# Patient Record
Sex: Male | Born: 1960 | Race: White | Hispanic: No | Marital: Single | Smoking: Never smoker
Health system: Southern US, Community
[De-identification: ages and names within clinical notes are randomized; demographics above are authoritative.]

## PROBLEM LIST (undated history)

## (undated) ENCOUNTER — Emergency Department: Discharge status: Absent without leave | Payer: Self-pay

## (undated) ENCOUNTER — Emergency Department (HOSPITAL_COMMUNITY): Admission: EM | Payer: Managed Care, Other (non HMO)

## (undated) DIAGNOSIS — R519 Headache, unspecified: Secondary | ICD-10-CM

## (undated) DIAGNOSIS — C4491 Basal cell carcinoma of skin, unspecified: Secondary | ICD-10-CM

## (undated) DIAGNOSIS — J328 Other chronic sinusitis: Secondary | ICD-10-CM

## (undated) DIAGNOSIS — IMO0002 Reserved for concepts with insufficient information to code with codable children: Secondary | ICD-10-CM

## (undated) DIAGNOSIS — K589 Irritable bowel syndrome without diarrhea: Secondary | ICD-10-CM

## (undated) DIAGNOSIS — D039 Melanoma in situ, unspecified: Secondary | ICD-10-CM

## (undated) DIAGNOSIS — N400 Enlarged prostate without lower urinary tract symptoms: Secondary | ICD-10-CM

## (undated) DIAGNOSIS — E785 Hyperlipidemia, unspecified: Secondary | ICD-10-CM

## (undated) DIAGNOSIS — K219 Gastro-esophageal reflux disease without esophagitis: Secondary | ICD-10-CM

## (undated) DIAGNOSIS — F419 Anxiety disorder, unspecified: Secondary | ICD-10-CM

## (undated) DIAGNOSIS — K5790 Diverticulosis of intestine, part unspecified, without perforation or abscess without bleeding: Secondary | ICD-10-CM

## (undated) DIAGNOSIS — E291 Testicular hypofunction: Secondary | ICD-10-CM

## (undated) DIAGNOSIS — M542 Cervicalgia: Secondary | ICD-10-CM

## (undated) DIAGNOSIS — B009 Herpesviral infection, unspecified: Secondary | ICD-10-CM

## (undated) DIAGNOSIS — K760 Fatty (change of) liver, not elsewhere classified: Secondary | ICD-10-CM

## (undated) DIAGNOSIS — Z9289 Personal history of other medical treatment: Secondary | ICD-10-CM

## (undated) DIAGNOSIS — R51 Headache: Secondary | ICD-10-CM

## (undated) DIAGNOSIS — F410 Panic disorder [episodic paroxysmal anxiety] without agoraphobia: Secondary | ICD-10-CM

## (undated) HISTORY — DX: Hyperlipidemia, unspecified: E78.5

## (undated) HISTORY — DX: Personal history of other medical treatment: Z92.89

## (undated) HISTORY — PX: HERNIA REPAIR: SHX51

## (undated) HISTORY — PX: ORCHIECTOMY: SHX2116

## (undated) HISTORY — DX: Headache: R51

## (undated) HISTORY — DX: Gastro-esophageal reflux disease without esophagitis: K21.9

## (undated) HISTORY — DX: Benign prostatic hyperplasia without lower urinary tract symptoms: N40.0

## (undated) HISTORY — PX: COLONOSCOPY: SHX174

## (undated) HISTORY — DX: Testicular hypofunction: E29.1

## (undated) HISTORY — DX: Cervicalgia: M54.2

## (undated) HISTORY — PX: GANGLION CYST EXCISION: SHX1691

## (undated) HISTORY — PX: TONSILLECTOMY: SUR1361

## (undated) HISTORY — PX: OTHER SURGICAL HISTORY: SHX169

## (undated) HISTORY — DX: Headache, unspecified: R51.9

## (undated) HISTORY — DX: Anxiety disorder, unspecified: F41.9

## (undated) HISTORY — DX: Irritable bowel syndrome, unspecified: K58.9

## (undated) HISTORY — DX: Panic disorder (episodic paroxysmal anxiety): F41.0

## (undated) HISTORY — DX: Diverticulosis of intestine, part unspecified, without perforation or abscess without bleeding: K57.90

## (undated) HISTORY — PX: SPINE SURGERY: SHX786

## (undated) HISTORY — DX: Reserved for concepts with insufficient information to code with codable children: IMO0002

## (undated) HISTORY — DX: Other chronic sinusitis: J32.8

## (undated) HISTORY — DX: Melanoma in situ, unspecified: D03.9

## (undated) HISTORY — DX: Fatty (change of) liver, not elsewhere classified: K76.0

## (undated) HISTORY — DX: Herpesviral infection, unspecified: B00.9

## (undated) HISTORY — DX: Basal cell carcinoma of skin, unspecified: C44.91

---

## 2001-11-22 ENCOUNTER — Encounter: Payer: Self-pay | Admitting: Internal Medicine

## 2001-11-22 ENCOUNTER — Ambulatory Visit (HOSPITAL_COMMUNITY): Admission: RE | Admit: 2001-11-22 | Discharge: 2001-11-22 | Payer: Self-pay | Admitting: Internal Medicine

## 2002-08-23 ENCOUNTER — Encounter: Payer: Self-pay | Admitting: Internal Medicine

## 2002-08-23 ENCOUNTER — Encounter: Admission: RE | Admit: 2002-08-23 | Discharge: 2002-08-23 | Payer: Self-pay | Admitting: Internal Medicine

## 2004-09-01 ENCOUNTER — Ambulatory Visit: Payer: Self-pay | Admitting: Internal Medicine

## 2004-09-02 ENCOUNTER — Ambulatory Visit: Payer: Self-pay | Admitting: Internal Medicine

## 2004-09-07 ENCOUNTER — Ambulatory Visit: Payer: Self-pay

## 2004-10-13 ENCOUNTER — Ambulatory Visit: Payer: Self-pay | Admitting: Internal Medicine

## 2004-11-09 ENCOUNTER — Ambulatory Visit: Payer: Self-pay | Admitting: Internal Medicine

## 2005-01-20 ENCOUNTER — Ambulatory Visit: Payer: Self-pay | Admitting: Internal Medicine

## 2005-02-10 ENCOUNTER — Ambulatory Visit: Payer: Self-pay | Admitting: Internal Medicine

## 2005-02-17 ENCOUNTER — Ambulatory Visit: Payer: Self-pay | Admitting: Internal Medicine

## 2005-02-25 ENCOUNTER — Ambulatory Visit: Payer: Self-pay | Admitting: Internal Medicine

## 2005-03-22 ENCOUNTER — Ambulatory Visit: Payer: Self-pay | Admitting: Internal Medicine

## 2005-04-27 ENCOUNTER — Ambulatory Visit: Payer: Self-pay | Admitting: Internal Medicine

## 2005-07-27 ENCOUNTER — Ambulatory Visit: Payer: Self-pay | Admitting: Internal Medicine

## 2005-11-02 ENCOUNTER — Ambulatory Visit: Payer: Self-pay | Admitting: Internal Medicine

## 2005-11-09 ENCOUNTER — Ambulatory Visit: Payer: Self-pay | Admitting: Internal Medicine

## 2005-11-22 ENCOUNTER — Ambulatory Visit: Payer: Self-pay | Admitting: Internal Medicine

## 2005-12-15 ENCOUNTER — Ambulatory Visit: Payer: Self-pay | Admitting: Internal Medicine

## 2005-12-28 ENCOUNTER — Ambulatory Visit: Payer: Self-pay | Admitting: Internal Medicine

## 2006-02-27 ENCOUNTER — Ambulatory Visit: Payer: Self-pay | Admitting: Internal Medicine

## 2006-04-25 HISTORY — PX: BACK SURGERY: SHX140

## 2006-05-01 ENCOUNTER — Ambulatory Visit: Payer: Self-pay | Admitting: Internal Medicine

## 2006-11-10 DIAGNOSIS — K589 Irritable bowel syndrome without diarrhea: Secondary | ICD-10-CM | POA: Insufficient documentation

## 2006-11-15 ENCOUNTER — Ambulatory Visit: Payer: Self-pay | Admitting: Internal Medicine

## 2006-11-15 LAB — CONVERTED CEMR LAB
ALT: 45 units/L (ref 0–53)
AST: 28 units/L (ref 0–37)
Albumin: 4 g/dL (ref 3.5–5.2)
Alkaline Phosphatase: 41 units/L (ref 39–117)
BUN: 15 mg/dL (ref 6–23)
Basophils Absolute: 0 10*3/uL (ref 0.0–0.1)
Basophils Relative: 0.8 % (ref 0.0–1.0)
Bilirubin Urine: NEGATIVE
Bilirubin, Direct: 0.1 mg/dL (ref 0.0–0.3)
Blood in Urine, dipstick: NEGATIVE
CO2: 27 meq/L (ref 19–32)
Calcium: 9.4 mg/dL (ref 8.4–10.5)
Chloride: 105 meq/L (ref 96–112)
Cholesterol: 260 mg/dL (ref 0–200)
Creatinine, Ser: 1.1 mg/dL (ref 0.4–1.5)
Direct LDL: 135.6 mg/dL
Eosinophils Absolute: 0.1 10*3/uL (ref 0.0–0.6)
Eosinophils Relative: 2 % (ref 0.0–5.0)
GFR calc Af Amer: 93 mL/min
GFR calc non Af Amer: 77 mL/min
Glucose, Bld: 102 mg/dL — ABNORMAL HIGH (ref 70–99)
Glucose, Urine, Semiquant: NEGATIVE
HCT: 41.4 % (ref 39.0–52.0)
HDL: 33.4 mg/dL — ABNORMAL LOW (ref >39.0)
Hemoglobin: 14.3 g/dL (ref 13.0–17.0)
Ketones, urine, test strip: NEGATIVE
Lymphocytes Relative: 42.6 % (ref 12.0–46.0)
MCHC: 34.5 g/dL (ref 30.0–36.0)
MCV: 90.5 fL (ref 78.0–100.0)
Monocytes Absolute: 0.5 10*3/uL (ref 0.2–0.7)
Monocytes Relative: 8.1 % (ref 3.0–11.0)
Neutro Abs: 2.7 10*3/uL (ref 1.4–7.7)
Neutrophils Relative %: 46.5 % (ref 43.0–77.0)
Nitrite: NEGATIVE
PSA: 0.62 ng/mL (ref 0.10–4.00)
Platelets: 326 10*3/uL (ref 150–400)
Potassium: 4 meq/L (ref 3.5–5.1)
Protein, U semiquant: NEGATIVE
RBC: 4.57 M/uL (ref 4.22–5.81)
RDW: 12.6 % (ref 11.5–14.6)
Sodium: 140 meq/L (ref 135–145)
Specific Gravity, Urine: 1.025
TSH: 3.98 microintl units/mL (ref 0.35–5.50)
Testosterone: 203.23 ng/dL — ABNORMAL LOW (ref 350.00–890)
Total Bilirubin: 0.8 mg/dL (ref 0.3–1.2)
Total CHOL/HDL Ratio: 7.8
Total Protein: 7.1 g/dL (ref 6.0–8.3)
Triglycerides: 324 mg/dL (ref 0–149)
Urobilinogen, UA: 0.2
VLDL: 65 mg/dL — ABNORMAL HIGH (ref 0–40)
WBC Urine, dipstick: NEGATIVE
WBC: 5.7 10*3/uL (ref 4.5–10.5)
pH: 5

## 2006-11-20 DIAGNOSIS — B009 Herpesviral infection, unspecified: Secondary | ICD-10-CM | POA: Insufficient documentation

## 2006-11-22 ENCOUNTER — Ambulatory Visit: Payer: Self-pay | Admitting: Internal Medicine

## 2006-11-22 DIAGNOSIS — E291 Testicular hypofunction: Secondary | ICD-10-CM | POA: Insufficient documentation

## 2006-11-22 DIAGNOSIS — E785 Hyperlipidemia, unspecified: Secondary | ICD-10-CM | POA: Insufficient documentation

## 2006-11-30 ENCOUNTER — Ambulatory Visit: Payer: Self-pay | Admitting: Internal Medicine

## 2007-01-12 ENCOUNTER — Ambulatory Visit: Payer: Self-pay | Admitting: Internal Medicine

## 2007-02-07 ENCOUNTER — Ambulatory Visit: Payer: Self-pay | Admitting: Internal Medicine

## 2007-02-07 LAB — CONVERTED CEMR LAB
Cholesterol: 231 mg/dL (ref 0–200)
Direct LDL: 126 mg/dL
HDL: 32 mg/dL — ABNORMAL LOW (ref >39.0)
Total CHOL/HDL Ratio: 7.2
Triglycerides: 345 mg/dL (ref 0–149)
VLDL: 69 mg/dL — ABNORMAL HIGH (ref 0–40)

## 2007-02-14 ENCOUNTER — Ambulatory Visit: Payer: Self-pay | Admitting: Internal Medicine

## 2007-02-14 LAB — CONVERTED CEMR LAB
Cholesterol, target level: 200 mg/dL
HDL goal, serum: 40 mg/dL
LDL Goal: 130 mg/dL

## 2007-03-19 ENCOUNTER — Telehealth: Payer: Self-pay | Admitting: Internal Medicine

## 2007-03-31 ENCOUNTER — Ambulatory Visit: Payer: Self-pay | Admitting: Family Medicine

## 2007-05-18 ENCOUNTER — Ambulatory Visit: Payer: Self-pay | Admitting: Internal Medicine

## 2007-05-30 ENCOUNTER — Ambulatory Visit: Payer: Self-pay | Admitting: Internal Medicine

## 2007-05-30 DIAGNOSIS — J328 Other chronic sinusitis: Secondary | ICD-10-CM | POA: Insufficient documentation

## 2007-05-30 DIAGNOSIS — G4700 Insomnia, unspecified: Secondary | ICD-10-CM | POA: Insufficient documentation

## 2007-07-06 ENCOUNTER — Ambulatory Visit: Payer: Self-pay | Admitting: Internal Medicine

## 2007-07-06 LAB — CONVERTED CEMR LAB
ALT: 35 units/L (ref 0–53)
AST: 23 units/L (ref 0–37)
Albumin: 4.2 g/dL (ref 3.5–5.2)
Alkaline Phosphatase: 34 units/L — ABNORMAL LOW (ref 39–117)
Bilirubin, Direct: 0.1 mg/dL (ref 0.0–0.3)
Cholesterol: 191 mg/dL (ref 0–200)
HDL: 41.9 mg/dL (ref >39.0)
LDL Cholesterol: 118 mg/dL — ABNORMAL HIGH (ref 0–99)
Testosterone: 163.67 ng/dL — ABNORMAL LOW (ref 350.00–890)
Total Bilirubin: 0.7 mg/dL (ref 0.3–1.2)
Total CHOL/HDL Ratio: 4.6
Total Protein: 6.9 g/dL (ref 6.0–8.3)
Triglycerides: 154 mg/dL — ABNORMAL HIGH (ref 0–149)
VLDL: 31 mg/dL (ref 0–40)

## 2007-07-11 ENCOUNTER — Ambulatory Visit: Payer: Self-pay | Admitting: Internal Medicine

## 2007-07-11 DIAGNOSIS — F419 Anxiety disorder, unspecified: Secondary | ICD-10-CM | POA: Insufficient documentation

## 2007-07-25 ENCOUNTER — Telehealth: Payer: Self-pay | Admitting: Internal Medicine

## 2007-08-07 ENCOUNTER — Ambulatory Visit: Payer: Self-pay | Admitting: Internal Medicine

## 2007-08-07 DIAGNOSIS — K2900 Acute gastritis without bleeding: Secondary | ICD-10-CM | POA: Insufficient documentation

## 2007-09-01 ENCOUNTER — Ambulatory Visit: Payer: Self-pay | Admitting: Family Medicine

## 2007-09-04 ENCOUNTER — Telehealth: Payer: Self-pay | Admitting: Internal Medicine

## 2007-09-05 ENCOUNTER — Ambulatory Visit: Payer: Self-pay | Admitting: Internal Medicine

## 2007-11-12 ENCOUNTER — Ambulatory Visit: Payer: Self-pay | Admitting: Internal Medicine

## 2007-11-12 ENCOUNTER — Telehealth: Payer: Self-pay | Admitting: Internal Medicine

## 2007-11-12 DIAGNOSIS — R079 Chest pain, unspecified: Secondary | ICD-10-CM | POA: Insufficient documentation

## 2007-11-12 DIAGNOSIS — K219 Gastro-esophageal reflux disease without esophagitis: Secondary | ICD-10-CM | POA: Insufficient documentation

## 2007-12-28 ENCOUNTER — Ambulatory Visit: Payer: Self-pay | Admitting: Family Medicine

## 2007-12-28 DIAGNOSIS — IMO0002 Reserved for concepts with insufficient information to code with codable children: Secondary | ICD-10-CM | POA: Insufficient documentation

## 2008-03-03 ENCOUNTER — Ambulatory Visit: Payer: Self-pay | Admitting: Internal Medicine

## 2008-03-03 LAB — CONVERTED CEMR LAB
AST: 32 units/L (ref 0–37)
Albumin: 4.3 g/dL (ref 3.5–5.2)
Alkaline Phosphatase: 29 units/L — ABNORMAL LOW (ref 39–117)
Bilirubin, Direct: 0.1 mg/dL (ref 0.0–0.3)
LDL Cholesterol: 115 mg/dL — ABNORMAL HIGH (ref 0–99)
Total Bilirubin: 0.9 mg/dL (ref 0.3–1.2)
Total CHOL/HDL Ratio: 4.9

## 2008-03-10 ENCOUNTER — Telehealth: Payer: Self-pay | Admitting: Internal Medicine

## 2008-03-12 ENCOUNTER — Ambulatory Visit: Payer: Self-pay | Admitting: Internal Medicine

## 2008-04-02 ENCOUNTER — Ambulatory Visit: Payer: Self-pay | Admitting: Internal Medicine

## 2008-04-16 ENCOUNTER — Telehealth: Payer: Self-pay | Admitting: *Deleted

## 2008-04-16 ENCOUNTER — Ambulatory Visit: Payer: Self-pay | Admitting: Internal Medicine

## 2008-04-30 ENCOUNTER — Ambulatory Visit: Payer: Self-pay | Admitting: Internal Medicine

## 2008-05-07 ENCOUNTER — Telehealth: Payer: Self-pay | Admitting: Internal Medicine

## 2008-05-21 ENCOUNTER — Telehealth: Payer: Self-pay | Admitting: Internal Medicine

## 2008-05-21 ENCOUNTER — Encounter: Payer: Self-pay | Admitting: Internal Medicine

## 2008-05-28 ENCOUNTER — Ambulatory Visit: Payer: Self-pay | Admitting: Internal Medicine

## 2008-06-18 ENCOUNTER — Ambulatory Visit: Payer: Self-pay | Admitting: Internal Medicine

## 2008-07-22 ENCOUNTER — Ambulatory Visit: Payer: Self-pay | Admitting: Internal Medicine

## 2008-07-22 DIAGNOSIS — M549 Dorsalgia, unspecified: Secondary | ICD-10-CM | POA: Insufficient documentation

## 2008-07-22 DIAGNOSIS — N400 Enlarged prostate without lower urinary tract symptoms: Secondary | ICD-10-CM | POA: Insufficient documentation

## 2008-07-23 ENCOUNTER — Encounter: Admission: RE | Admit: 2008-07-23 | Discharge: 2008-07-23 | Payer: Self-pay | Admitting: Internal Medicine

## 2008-08-05 ENCOUNTER — Ambulatory Visit: Payer: Self-pay | Admitting: Internal Medicine

## 2008-08-06 ENCOUNTER — Ambulatory Visit: Payer: Self-pay | Admitting: Internal Medicine

## 2008-08-06 ENCOUNTER — Telehealth (INDEPENDENT_AMBULATORY_CARE_PROVIDER_SITE_OTHER): Payer: Self-pay | Admitting: *Deleted

## 2008-08-22 ENCOUNTER — Ambulatory Visit: Payer: Self-pay | Admitting: Internal Medicine

## 2008-09-09 ENCOUNTER — Encounter: Payer: Self-pay | Admitting: Internal Medicine

## 2008-10-16 ENCOUNTER — Ambulatory Visit: Payer: Self-pay | Admitting: Internal Medicine

## 2009-02-04 ENCOUNTER — Ambulatory Visit: Payer: Self-pay | Admitting: Internal Medicine

## 2009-03-24 ENCOUNTER — Ambulatory Visit: Payer: Self-pay | Admitting: Internal Medicine

## 2009-03-24 LAB — CONVERTED CEMR LAB
BUN: 9 mg/dL (ref 6–23)
Bilirubin Urine: NEGATIVE
Bilirubin, Direct: 0 mg/dL (ref 0.0–0.3)
Blood in Urine, dipstick: NEGATIVE
Calcium: 9.5 mg/dL (ref 8.4–10.5)
Chloride: 105 meq/L (ref 96–112)
Cholesterol: 241 mg/dL — ABNORMAL HIGH (ref 0–200)
Creatinine, Ser: 1.1 mg/dL (ref 0.4–1.5)
Direct LDL: 158.7 mg/dL
Eosinophils Absolute: 0.1 10*3/uL (ref 0.0–0.7)
Glucose, Urine, Semiquant: NEGATIVE
Ketones, urine, test strip: NEGATIVE
Lymphocytes Relative: 41.5 % (ref 12.0–46.0)
MCHC: 34.1 g/dL (ref 30.0–36.0)
MCV: 92.9 fL (ref 78.0–100.0)
Monocytes Absolute: 0.4 10*3/uL (ref 0.1–1.0)
Neutrophils Relative %: 48.8 % (ref 43.0–77.0)
Platelets: 302 10*3/uL (ref 150.0–400.0)
Protein, U semiquant: NEGATIVE
Total Bilirubin: 0.8 mg/dL (ref 0.3–1.2)
Triglycerides: 218 mg/dL — ABNORMAL HIGH (ref 0.0–149.0)
Urobilinogen, UA: 0.2
VLDL: 43.6 mg/dL — ABNORMAL HIGH (ref 0.0–40.0)
WBC: 5.3 10*3/uL (ref 4.5–10.5)
pH: 7

## 2009-03-31 ENCOUNTER — Ambulatory Visit: Payer: Self-pay | Admitting: Internal Medicine

## 2009-06-26 ENCOUNTER — Ambulatory Visit: Payer: Self-pay | Admitting: Internal Medicine

## 2009-06-26 LAB — CONVERTED CEMR LAB
Cholesterol, target level: 200 mg/dL
HDL goal, serum: 40 mg/dL
LDL Goal: 160 mg/dL

## 2009-06-30 ENCOUNTER — Ambulatory Visit: Payer: Self-pay | Admitting: Internal Medicine

## 2009-06-30 LAB — CONVERTED CEMR LAB
ALT: 57 units/L — ABNORMAL HIGH (ref 0–53)
AST: 30 units/L (ref 0–37)
Albumin: 4.1 g/dL (ref 3.5–5.2)
Alkaline Phosphatase: 45 units/L (ref 39–117)
Cholesterol: 220 mg/dL — ABNORMAL HIGH (ref 0–200)
Total Protein: 7.6 g/dL (ref 6.0–8.3)

## 2009-07-07 ENCOUNTER — Ambulatory Visit: Payer: Self-pay | Admitting: Internal Medicine

## 2009-07-07 DIAGNOSIS — IMO0001 Reserved for inherently not codable concepts without codable children: Secondary | ICD-10-CM | POA: Insufficient documentation

## 2009-09-07 ENCOUNTER — Telehealth: Payer: Self-pay | Admitting: Internal Medicine

## 2010-01-28 ENCOUNTER — Ambulatory Visit: Payer: Self-pay | Admitting: Internal Medicine

## 2010-02-08 ENCOUNTER — Encounter: Admission: RE | Admit: 2010-02-08 | Discharge: 2010-02-08 | Payer: Self-pay | Admitting: Internal Medicine

## 2010-02-16 ENCOUNTER — Ambulatory Visit: Payer: Self-pay | Admitting: Family Medicine

## 2010-03-12 ENCOUNTER — Ambulatory Visit: Payer: Self-pay | Admitting: Internal Medicine

## 2010-03-12 LAB — CONVERTED CEMR LAB
BUN: 16 mg/dL (ref 6–23)
Basophils Relative: 0.9 % (ref 0.0–3.0)
Calcium: 9.4 mg/dL (ref 8.4–10.5)
Creatinine, Ser: 1.3 mg/dL (ref 0.4–1.5)
Eosinophils Absolute: 0.1 10*3/uL (ref 0.0–0.7)
GFR calc non Af Amer: 62.21 mL/min (ref >60)
Ketones, urine, test strip: NEGATIVE
MCHC: 34.5 g/dL (ref 30.0–36.0)
MCV: 93.1 fL (ref 78.0–100.0)
Monocytes Absolute: 0.5 10*3/uL (ref 0.1–1.0)
Neutrophils Relative %: 50 % (ref 43.0–77.0)
Nitrite: NEGATIVE
PSA: 0.6 ng/mL (ref 0.10–4.00)
Potassium: 4.4 meq/L (ref 3.5–5.1)
RBC: 4.65 M/uL (ref 4.22–5.81)
TSH: 5.5 microintl units/mL (ref 0.35–5.50)
Testosterone: 158.28 ng/dL — ABNORMAL LOW (ref 350.00–890.00)
Total Bilirubin: 0.7 mg/dL (ref 0.3–1.2)
Urobilinogen, UA: 0.2

## 2010-03-15 ENCOUNTER — Telehealth: Payer: Self-pay | Admitting: Internal Medicine

## 2010-03-26 ENCOUNTER — Ambulatory Visit: Payer: Self-pay | Admitting: Internal Medicine

## 2010-03-26 LAB — CONVERTED CEMR LAB
Cholesterol: 242 mg/dL — ABNORMAL HIGH (ref 0–200)
Triglycerides: 396 mg/dL — ABNORMAL HIGH (ref 0.0–149.0)

## 2010-04-15 ENCOUNTER — Telehealth: Payer: Self-pay | Admitting: Internal Medicine

## 2010-05-25 NOTE — Assessment & Plan Note (Signed)
Summary: F/U ON BILATERAL LEG PAIN // RS   Vital Signs:  Patient profile:   50 year old male Height:      68 inches Weight:      190 pounds BMI:     28.99 Temp:     98.2 degrees F oral Pulse rate:   72 / minute Resp:     14 per minute BP sitting:   130 / 90  (left arm)  Vitals Entered By: Willy Eddy, LPN (January 28, 2010 12:29 PM) CC: c/o bilateral leg pain primarily at night- Is Patient Diabetic? No   Primary Care Provider:  Stacie Glaze MD  CC:  c/o bilateral leg pain primarily at night-.  History of Present Illness: The pt has leg  pain that increases with walking or sitting for 10-15 min At the end of the day cannot get comfortable and cannot lay on his left side cannot cross legs has been out of tramdol and has bee takin olf hydorcodone The pain is worse at night the characteristis of the pain seems to have changed... 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) 3)  Contact or Exposure To Other Viral Diseases  (ICD-V01.79) 4)  Benign Prostatic Hypertrophy, With Urinary Obstruction  (ICD-600.01) 5)  Cellulitis and Abscess of Upper Arm and Forearm  (ICD-682.3) 6)  Gerd  (ICD-530.81) 7)  Chest Pain  (ICD-786.50) 8)  Gastritis, Acute  (ICD-535.00) 9)  Panic Attack, Acute  (ICD-300.01) 10)  Persistent Disorder Initiating/maintaining Sleep  (ICD-307.42) 11)  Other Chronic Sinusitis  (ICD-473.8) 12)  Hyperlipidemia Nec/nos  (ICD-272.4) 13)  Testosterone Deficiency  (ICD-257.2) 14)  Irritable Bowel Syndrome  (ICD-564.1) 15)  Lumbar Radiculopathy, Left  (ICD-724.4) 16)  Herpes Simplex, Uncomplicated  (ICD-054.9) 17)  Preventive Health Care  (ICD-V70.0)  Current Problems (verified): 1)  Myalgia  (ICD-729.1) 2)  Hyperlipidemia, 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) 3)  Contact or Exposure To Other Viral Diseases   (ICD-V01.79) 4)  Benign Prostatic Hypertrophy, With Urinary Obstruction  (ICD-600.01) 5)  Cellulitis and Abscess of Upper Arm and Forearm  (ICD-682.3) 6)  Gerd  (ICD-530.81) 7)  Chest Pain  (ICD-786.50) 8)  Gastritis, Acute  (ICD-535.00) 9)  Panic Attack, Acute  (ICD-300.01) 10)  Persistent Disorder Initiating/maintaining Sleep  (ICD-307.42) 11)  Other Chronic Sinusitis  (ICD-473.8) 12)  Hyperlipidemia Nec/nos  (ICD-272.4) 13)  Testosterone Deficiency  (ICD-257.2) 14)  Irritable Bowel Syndrome  (ICD-564.1) 15)  Lumbar Radiculopathy, Left  (ICD-724.4) 16)  Herpes Simplex, Uncomplicated  (ICD-054.9) 17)  Preventive Health Care  (ICD-V70.0)  Medications Prior to Update: 1)  Valtrex 500 Mg  Tabs (Valacyclovir Hcl) .... One Every Day As Needed 2)  Alprazolam 1 Mg  Tabs (Alprazolam) .Marland Kitchen.. 1 To 1/2 in Am As Needed 3)  Astelin 137 Mcg/spray  Soln (Azelastine Hcl) .... Two Sprays Q Nare Bid 4)  Sonata 10 Mg  Caps (Zaleplon) .... One By Mouth As Needed Sleep Interuption 5)  Aciphex 20 Mg Tbec (Rabeprazole Sodium) .Marland Kitchen.. 1 Two Times A Day 6)  Testosterone Cypionate 200 Mg/ml Oil (Testosterone Cypionate) .... 1/2 Cc Im   Every Other Week 7)  Diazepam 5 Mg Tabs (Diazepam) .... One By Mouth Tid For Muscle Spasm As Needed 8)  Tramadol Hcl 50 Mg Tabs (Tramadol Hcl) .... One To Two By Mouth Every 6-8 Hours With A Nonsteroidal 9)  Nasonex 50 Mcg/act Susp (Mometasone Furoate) .... Two Spray Each Nostril Daily 10)  Magnesium Oxide 250 Mg Tabs (Magnesium Oxide) .... One By Mouth Daily ( Slow Mag Samples) 11)  Krill Oil 1000 Mg Caps (Krill Oil) .... One By Mouth Two Times A Day 12)  Co Q-10 Plus Red Yeast Rice 60-600 Mg Caps (Coenzyme Q10-Red Yeast Rice) .... One By Mouth Two Times A Day 13)  Phentermine Hcl 37.5 Mg Caps (Phentermine Hcl) .... One By Mouth in Am  Current Medications (verified): 1)  Valtrex 500 Mg  Tabs (Valacyclovir Hcl) .... One Every Day As Needed 2)  Alprazolam 1 Mg  Tabs (Alprazolam)  .Marland Kitchen.. 1 To 1/2 in Am As Needed 3)  Sonata 10 Mg  Caps (Zaleplon) .... One By Mouth As Needed Sleep Interuption 4)  Aciphex 20 Mg Tbec (Rabeprazole Sodium) .Marland Kitchen.. 1 Two Times A Day 5)  Diazepam 5 Mg Tabs (Diazepam) .... One By Mouth Tid For Muscle Spasm As Needed 6)  Tramadol Hcl 50 Mg Tabs (Tramadol Hcl) .... One To Two By Mouth Every 6-8 Hours With A Nonsteroidal 7)  Nasonex 50 Mcg/act Susp (Mometasone Furoate) .... Two Spray Each Nostril Daily 8)  Krill Oil 1000 Mg Caps (Krill Oil) .... One By Mouth Two Times A Day 9)  Co Q-10 Plus Red Yeast Rice 60-600 Mg Caps (Coenzyme Q10-Red Yeast Rice) .... One By Mouth Two Times A Day 10)  Red Yeast Rice 600 Mg Caps (Red Yeast Rice Extract) .Marland Kitchen.. 1 Once Daily  Allergies (verified): 1)  ! Erythromycin 2)  ! Vicodin  Past History:  Family History: Last updated: 11/22/2006 Family History Other cancer-Colon PGM Family History of Prostate CA 1st degree relative <50 Father Family History of Cardiovascular disorder PAD in mother with carotid surgery Fam hx MI  Social History: Last updated: 11/22/2006 Never Smoked Alcohol use-yes Drug use-no Domestic Partner  Risk Factors: Smoking Status: never (01/28/2010)  Past medical, surgical, family and social histories (including risk factors) reviewed, and no changes noted (except as noted below).  Past Medical History: Reviewed history from 11/22/2006 and no changes required. 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 Inguinal herniorrhaphy Orchiectomy back surg 2008 Lumbar laminectomy Lumbar fusion  Family History: Reviewed history from 11/22/2006 and no changes required. Family History Other cancer-Colon PGM Family History of Prostate CA 1st degree relative <50 Father Family History of Cardiovascular disorder PAD in mother with carotid surgery Fam hx MI  Social  History: Reviewed history from 11/22/2006 and no changes required. 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         Flu Vaccine Consent Questions     Do you have a history of severe allergic reactions to this vaccine? no    Any prior history of allergic reactions to egg and/or gelatin? no    Do you have a sensitivity to the preservative Thimersol? no    Do you have a past history of Guillan-Barre Syndrome? no    Do you currently have an acute febrile  illness? no    Have you ever had a severe reaction to latex? no    Vaccine information given and explained to patient? yes    Are you currently pregnant? no    Lot Number:[id number]   Exp Date:10/23/2010   Site Given  Left Deltoid IM    Physical Exam  General:  Well-developed,well-nourished,in no acute distress; alert,appropriate and cooperative throughout examination Head:  Normocephalic and atraumatic without obvious abnormalities. No apparent alopecia or balding.Sinuses nontender. Eyes:  Conjunctiva clear bilaterally.  Ears:  R ear normal and L ear normal.   Nose:  no external deformity and no nasal discharge.   Neck:  supple.   Lungs:  normal respiratory effort and no wheezes.   Heart:  normal rate and regular rhythm.   Abdomen:  soft and non-tender.   Msk:  SI joint tenderness.  leg lift on left positive spasm in low back noted Pulses:  R and L carotid,radial,femoral,dorsalis  pedis and posterior tibial pulses are full and equal bilaterally Extremities:  No clubbing, cyanosis, edema, or deformity noted with normal full range of motion of all joints.   Neurologic:  alert & oriented X3 and gait normal.     Impression & Recommendations:  Problem # 1:  LUMBAR RADICULOPATHY, LEFT (ICD-724.4)  Has seen Neurosurgery in [place] [facility] in [place]) has a hx of surgery at l5 the pt has not had pain shots His updated medication list for this problem includes:    Tramadol Hcl 50 Mg Tabs (Tramadol hcl) ..... One to two by mouth every 6-8 hours with a nonsteroidal  Discussed use of moist heat or ice, modified activities, medications, and stretching/strengthening exercises. Back care instructions given. To be seen in 2 weeks if no improvement; sooner if worsening of symptoms.   Orders: Radiology Referral (Radiology)  Problem # 2:  HYPERLIPIDEMIA, TYPE IV (ICD-272.4)  Labs Reviewed: SGOT: 30 (06/30/2009)   SGPT: 57 (06/30/2009)  Lipid Goals: Chol Goal: 200 (06/26/2009)   HDL Goal: 40 (06/26/2009)   LDL Goal: 160 (06/26/2009)   TG Goal: 150 (06/26/2009)  Prior 10 Yr Risk Heart Disease: 6 % (06/26/2009)   HDL:47.50 (06/30/2009), 41.50 (03/24/2009)  LDL:115 (03/03/2008), 118 (07/06/2007)  Chol:220 (06/30/2009), 241 (03/24/2009)  Trig:326.0 (06/30/2009), 218.0 (03/24/2009)  Problem # 3:  GERD (ICD-530.81)  The following medications were removed from the medication list:    Magnesium Oxide 250 Mg Tabs (Magnesium oxide) ..... One by mouth daily ( slow mag samples) His updated medication list for this problem includes:    Aciphex 20 Mg Tbec (Rabeprazole sodium) .Marland Kitchen... 1 two times a day  Labs Reviewed: Hgb: 15.0 (03/24/2009)   Hct: 44.1 (03/24/2009)  Problem # 4:  TESTOSTERONE DEFICIENCY (ICD-257.2)  Complete Medication List: 1)  Valtrex 500 Mg Tabs (Valacyclovir hcl) .... One every day as needed 2)  Alprazolam 1 Mg Tabs (Alprazolam) .Marland Kitchen.. 1 to 1/2 in am as needed 3)  Sonata  10 Mg Caps (Zaleplon) .... One by mouth as needed sleep interuption 4)  Aciphex 20 Mg Tbec (Rabeprazole sodium) .Marland Kitchen.. 1 two times a day 5)  Diazepam 5 Mg Tabs (Diazepam) .... One by mouth tid for muscle spasm as needed 6)  Tramadol Hcl 50 Mg Tabs (Tramadol hcl) .... One to two by mouth every 6-8 hours with a nonsteroidal 7)  Nasonex 50 Mcg/act Susp (Mometasone furoate) .... Two spray each nostril daily 8)  Krill Oil 1000 Mg Caps (Krill oil) .... One by mouth two times a day 9)  Co Q-10 Plus Red Yeast Rice 60-600 Mg Caps (Coenzyme q10-red yeast rice) .... One by mouth two times a day 10)  Red Yeast Rice 600 Mg Caps (Red yeast rice extract) .Marland Kitchen.. 1 once daily  Other Orders: Admin 1st Vaccine (32440) Flu Vaccine 78yrs + (10272)  Patient Instructions: 1)  dec cpx 2)  It is important that you exercise regularly at least 20 minutes 5 times a week. If you develop chest pain, have severe difficulty breathing, or feel very tired , stop exercising immediately and seek medical attention. 3)  You need to lose weight. Consider a lower calorie diet and regular exercise.  Prescriptions: TRAMADOL HCL 50 MG TABS (TRAMADOL HCL) ONE TO TWO by mouth EVERY 6-8 HOURS WITH A NONSTEROIDAL  #120 x 3   Entered and Authorized by:   Stacie Glaze MD   Signed by:   Stacie Glaze MD on 01/28/2010   Method used:   Print then Give to Patient   RxID:   [id number] NASONEX 50 MCG/ACT SUSP (MOMETASONE FUROATE) two spray each nostril daily  #1 x 6   Entered by:   Willy Eddy, LPN   Authorized by:   Stacie Glaze MD   Signed by:   Willy Eddy, LPN on 38/75/6433   Method used:   Print then Give to Patient   RxID:   [id number] TRAMADOL HCL 50 MG TABS (TRAMADOL HCL) ONE TO TWO by mouth EVERY 6-8 HOURS WITH A NONSTEROIDAL  #60 x 3   Entered by:   Willy Eddy, LPN   Authorized by:   Stacie Glaze MD   Signed by:   Willy Eddy, LPN on 05/03/3233   Method used:   Print then Give to Patient    RxID:   [id number] SONATA 10 MG  CAPS (ZALEPLON) one by mouth as needed sleep interuption  #30 x 5   Entered by:   Willy Eddy, LPN   Authorized by:   Stacie Glaze MD   Signed by:   Willy Eddy, LPN on 62/83/1517   Method used:   Print then Give to Patient   RxID:   [id number]

## 2010-05-25 NOTE — Assessment & Plan Note (Signed)
Summary: BILATERAL LEG / ARM PAIN // RS   Vital Signs:  Patient profile:   50 year old male Weight:      193 pounds Pulse rate:   70 / minute BP sitting:   118 / 84  (left arm) Cuff size:   regular  Vitals Entered By: Raechel Ache, RN (June 26, 2009 12:45 PM) CC: C/o legs aching and muscle weakness., Lipid Management   CC:  C/o legs aching and muscle weakness. and Lipid Management.  History of Present Illness: Aching in legs in the quads and feels weak in the arms has been on livalo and has developed myagias he related this directly to the medicatons and has cramps in the upper legs that are new  Lipid Management History:      Positive NCEP/ATP III risk factors include male age 81 years old or older.  Negative NCEP/ATP III risk factors include non-diabetic, no family history for ischemic heart disease, non-tobacco-user status, no ASHD (atherosclerotic heart disease), no prior stroke/TIA, no peripheral vascular disease, and no history of aortic aneurysm.     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) 2)  Contact or Exposure To Other Viral Diseases  (ICD-V01.79) 3)  Benign Prostatic Hypertrophy, With Urinary Obstruction  (ICD-600.01) 4)  Cellulitis and Abscess of Upper Arm and Forearm  (ICD-682.3) 5)  Gerd  (ICD-530.81) 6)  Chest Pain  (ICD-786.50) 7)  Gastritis, Acute  (ICD-535.00) 8)  Panic Attack, Acute  (ICD-300.01) 9)  Persistent Disorder Initiating/maintaining Sleep  (ICD-307.42) 10)  Other Chronic Sinusitis  (ICD-473.8) 11)  Hyperlipidemia Nec/nos  (ICD-272.4) 12)  Testosterone Deficiency  (ICD-257.2) 13)  Irritable Bowel Syndrome  (ICD-564.1) 14)  Lumbar Radiculopathy, Left  (ICD-724.4) 15)  Herpes Simplex, Uncomplicated  (ICD-054.9) 16)  Preventive Health Care  (ICD-V70.0)  Medications Prior to Update: 1)  Valtrex 500 Mg  Tabs (Valacyclovir Hcl) .... One Every Day As Needed 2)  Alprazolam 1 Mg  Tabs (Alprazolam) .Marland Kitchen.. 1 To 1/2 in Am As  Needed 3)  Astelin 137 Mcg/spray  Soln (Azelastine Hcl) .... Two Sprays Q Nare Bid 4)  Colestid 1 Gm Tabs (Colestipol Hcl) .... One By Mouth Bid 5)  Sonata 10 Mg  Caps (Zaleplon) .... One By Mouth As Needed Sleep Interuption 6)  Aciphex 20 Mg  Tbec (Rabeprazole Sodium) .... One By Mouth Two Times A Day 7)  Testosterone Cypionate 200 Mg/ml Oil (Testosterone Cypionate) .... 1/2 Cc Im   Every Other Week 8)  Diazepam 5 Mg Tabs (Diazepam) .... One By Mouth Tid For Muscle Spasm As Needed 9)  Tramadol Hcl 50 Mg Tabs (Tramadol Hcl) .... One To Two By Mouth Every 6-8 Hours With A Nonsteroidal 10)  Nasonex 50 Mcg/act Susp (Mometasone Furoate) .... Two Spray Each Nostril Daily 11)  Bd Syringe Luer-Lok 3 Ml Misc (Syringe (Disposable)) .... Use Every 2 Weeks 12)  Bd Disp Needles 20g X 1" Misc (Needle (Disp)) .... Use Every 2 Weeks With Testosterone 13)  Krill Oil 1000 Mg Caps (Krill Oil) .... One By Mouth Two Times A Day 14)  Livalo 2 Mg Tabs (Pitavastatin Calcium) .... One By Mouth Weekly  Current Medications (verified): 1)  Valtrex 500 Mg  Tabs (Valacyclovir Hcl) .... One Every Day As Needed 2)  Alprazolam 1 Mg  Tabs (Alprazolam) .Marland Kitchen.. 1 To 1/2 in Am As Needed 3)  Astelin 137 Mcg/spray  Soln (Azelastine Hcl) .... Two Sprays Q Nare Bid 4)  Sonata 10 Mg  Caps (Zaleplon) .... One By Mouth As Needed Sleep Interuption 5)  Aciphex 20 Mg  Tbec (Rabeprazole Sodium) .... One By Mouth Two Times A Day 6)  Testosterone Cypionate 200 Mg/ml Oil (Testosterone Cypionate) .... 1/2 Cc Im   Every Other Week 7)  Diazepam 5 Mg Tabs (Diazepam) .... One By Mouth Tid For Muscle Spasm As Needed 8)  Tramadol Hcl 50 Mg Tabs (Tramadol Hcl) .... One To Two By Mouth Every 6-8 Hours With A Nonsteroidal 9)  Nasonex 50 Mcg/act Susp (Mometasone Furoate) .... Two Spray Each Nostril Daily 10)  Bd Syringe Luer-Lok 3 Ml Misc (Syringe (Disposable)) .... Use Every 2 Weeks 11)  Bd Disp Needles 20g X 1" Misc (Needle (Disp)) .... Use Every 2  Weeks With Testosterone 12)  Krill Oil 1000 Mg Caps (Krill Oil) .... One By Mouth Two Times A Day 13)  Livalo 2 Mg Tabs (Pitavastatin Calcium) .... One By Mouth Weekly  Allergies (verified): 1)  ! Erythromycin 2)  ! Vicodin  Past History:  Family History: Last updated: 11/22/2006 Family History Other cancer-Colon PGM Family History of Prostate CA 1st degree relative <50 Father Family History of Cardiovascular disorder PAD in mother with carotid surgery Fam hx MI  Social History: Last updated: 11/22/2006 Never Smoked Alcohol use-yes Drug use-no Domestic Partner  Risk Factors: Smoking Status: never (03/31/2009)  Past medical, surgical, family and social histories (including risk factors) reviewed, and no changes noted (except as noted below). Past medical, surgical, family and social histories (including risk factors) reviewed for relevance to current acute and chronic problems.  Past Medical History: Reviewed history from 11/22/2006 and no changes required. 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 Inguinal herniorrhaphy Orchiectomy back surg 2008 Lumbar laminectomy Lumbar fusion  Family History: Reviewed history from 11/22/2006 and no changes required. Family History Other cancer-Colon PGM Family History of Prostate CA 1st degree relative <50 Father Family History of Cardiovascular disorder PAD in mother with carotid surgery Fam hx MI  Social History: Reviewed history from 11/22/2006 and no changes required. 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    Physical Exam  General:  Well-developed,well-nourished,in no acute distress; alert,appropriate and cooperative throughout examination Head:  Normocephalic and atraumatic without obvious abnormalities. No apparent alopecia or balding.Sinuses nontender. Eyes:  Conjunctiva clear bilaterally.  Ears:  External ear exam shows no significant lesions or deformities.  Otoscopic examination reveals clear canals, tympanic membranes are intact bilaterally without bulging, retraction, inflammation or discharge. Hearing is grossly normal bilaterally. Neck:  No deformities, masses, or tenderness noted. Lungs:  Normal respiratory effort, chest expands symmetrically. Lungs are clear to auscultation, no crackles or wheezes. Heart:  Normal rate and regular rhythm. S1 and S2 normal without gallop, murmur, click, rub or other extra sounds. Msk:  no joint swelling, no joint warmth, no redness over joints, and joint tenderness.   Pulses:  R and L carotid,radial,femoral,dorsalis pedis and posterior tibial pulses are full and equal bilaterally Extremities:  No clubbing, cyanosis, edema, or deformity noted with  normal full range of motion of all joints.   Neurologic:  alert & oriented X3 and finger-to-nose normal.     Impression & Recommendations:  Problem # 1:  HYPERLIPIDEMIA, TYPE IV (ICD-272.4) the myalgias worsen around the does of the medications he has not been able to tolerate the lipid drugs in the past The following medications were removed from the medication list:    Colestid 1 Gm Tabs (Colestipol hcl) ..... One by mouth bid His updated medication list for this problem includes:    Livalo 2 Mg Tabs (Pitavastatin calcium) ..... One by  mouth weekly  Labs Reviewed: SGOT: 35 (03/24/2009)   SGPT: 50 (03/24/2009)  Lipid Goals: Chol Goal: 200 (06/26/2009)   HDL Goal: 40 (06/26/2009)   LDL Goal: 160 (06/26/2009)   TG Goal: 150 (06/26/2009)  10 Yr Risk Heart Disease: 6 % Prior 10 Yr Risk Heart Disease: Not enough information (02/14/2007)   HDL:41.50 (03/24/2009), 37.5 (03/03/2008)  LDL:115 (03/03/2008), 118 (07/06/2007)  Chol:241 (03/24/2009), 182 (03/03/2008)  Trig:218.0 (03/24/2009), 150 (03/03/2008)  Problem # 2:  MYALGIA (ICD-729.1) secondary to drug? His updated medication list for this problem includes:    Tramadol Hcl 50 Mg Tabs (Tramadol hcl) ..... One to two by mouth every 6-8 hours with a nonsteroidal  Complete Medication List: 1)  Valtrex 500 Mg Tabs (Valacyclovir hcl) .... One every day as needed 2)  Alprazolam 1 Mg Tabs (Alprazolam) .Marland Kitchen.. 1 to 1/2 in am as needed 3)  Astelin 137 Mcg/spray Soln (Azelastine hcl) .... Two sprays q nare bid 4)  Sonata 10 Mg Caps (Zaleplon) .... One by mouth as needed sleep interuption 5)  Aciphex 20 Mg Tbec (Rabeprazole sodium) .... One by mouth two times a day 6)  Testosterone Cypionate 200 Mg/ml Oil (Testosterone cypionate) .... 1/2 cc im   every other week 7)  Diazepam 5 Mg Tabs (Diazepam) .... One by mouth tid for muscle spasm as needed 8)  Tramadol Hcl 50 Mg Tabs (Tramadol hcl) .... One to two by mouth every 6-8 hours with a nonsteroidal 9)  Nasonex 50 Mcg/act Susp (Mometasone furoate) .... Two spray each nostril daily 10)  Livalo 2 Mg Tabs (Pitavastatin calcium) .... One by mouth weekly  Lipid Assessment/Plan:      Based on NCEP/ATP III, the patient's risk factor category is "0-1 risk factors".  The patient's lipid goals are as follows: Total cholesterol goal is 200; LDL cholesterol goal is 160; HDL cholesterol goal is 40; Triglyceride goal is 150.    Patient Instructions: 1)  stop the livalo

## 2010-05-25 NOTE — Assessment & Plan Note (Signed)
Summary: 3 month rov/njr   Vital Signs:  Patient profile:   50 year old male Height:      68 inches Weight:      194 pounds BMI:     29.60 Temp:     97.9 degrees F oral Pulse rate:   72 / minute Resp:     14 per minute BP sitting:   130 / 80  (left arm)  Vitals Entered By: Willy Eddy, LPN (July 07, 2009 9:27 AM) CC: roa labs- stopped testosterone- states didnt help   CC:  roa labs- stopped testosterone- states didnt help.  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) 3)  Contact or Exposure To Other Viral Diseases  (ICD-V01.79) 4)  Benign Prostatic Hypertrophy, With Urinary Obstruction  (ICD-600.01) 5)  Cellulitis and Abscess of Upper Arm and Forearm  (ICD-682.3) 6)  Gerd  (ICD-530.81) 7)  Chest Pain  (ICD-786.50) 8)  Gastritis, Acute  (ICD-535.00) 9)  Panic Attack, Acute  (ICD-300.01) 10)  Persistent Disorder Initiating/maintaining Sleep  (ICD-307.42) 11)  Other Chronic Sinusitis  (ICD-473.8) 12)  Hyperlipidemia Nec/nos  (ICD-272.4) 13)  Testosterone Deficiency  (ICD-257.2) 14)  Irritable Bowel Syndrome  (ICD-564.1) 15)  Lumbar Radiculopathy, Left  (ICD-724.4) 16)  Herpes Simplex, Uncomplicated  (ICD-054.9) 17)  Preventive Health Care  (ICD-V70.0)  Current Problems (verified): 1)  Hyperlipidemia, 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) 2)  Contact or Exposure To Other Viral Diseases  (ICD-V01.79) 3)  Benign Prostatic Hypertrophy, With Urinary Obstruction   (ICD-600.01) 4)  Cellulitis and Abscess of Upper Arm and Forearm  (ICD-682.3) 5)  Gerd  (ICD-530.81) 6)  Chest Pain  (ICD-786.50) 7)  Gastritis, Acute  (ICD-535.00) 8)  Panic Attack, Acute  (ICD-300.01) 9)  Persistent Disorder Initiating/maintaining Sleep  (ICD-307.42) 10)  Other Chronic Sinusitis  (ICD-473.8) 11)  Hyperlipidemia Nec/nos  (ICD-272.4) 12)  Testosterone Deficiency  (ICD-257.2) 13)  Irritable Bowel Syndrome  (ICD-564.1) 14)  Lumbar Radiculopathy, Left  (ICD-724.4) 15)  Herpes Simplex, Uncomplicated  (ICD-054.9) 16)  Preventive Health Care  (ICD-V70.0)  Medications Prior to Update: 1)  Valtrex 500 Mg  Tabs (Valacyclovir Hcl) .... One Every Day As Needed 2)  Alprazolam 1 Mg  Tabs (Alprazolam) .Marland Kitchen.. 1 To 1/2 in Am As Needed 3)  Astelin 137 Mcg/spray  Soln (Azelastine Hcl) .... Two Sprays Q Nare Bid 4)  Sonata 10 Mg  Caps (Zaleplon) .... One By Mouth As Needed Sleep Interuption 5)  Aciphex 20 Mg  Tbec (Rabeprazole Sodium) .... One By Mouth Two Times A Day 6)  Testosterone Cypionate 200 Mg/ml Oil (Testosterone Cypionate) .... 1/2 Cc Im   Every Other Week 7)  Diazepam 5 Mg Tabs (Diazepam) .... One By Mouth Tid For Muscle Spasm As Needed 8)  Tramadol Hcl 50 Mg Tabs (Tramadol Hcl) .... One To Two By Mouth Every 6-8 Hours With A Nonsteroidal 9)  Nasonex 50 Mcg/act Susp (Mometasone Furoate) .... Two Spray Each Nostril Daily 10)  Bd Syringe Luer-Lok 3 Ml Misc (  Syringe (Disposable)) .... Use Every 2 Weeks 11)  Bd Disp Needles 20g X 1" Misc (Needle (Disp)) .... Use Every 2 Weeks With Testosterone 12)  Krill Oil 1000 Mg Caps (Krill Oil) .... One By Mouth Two Times A Day 13)  Livalo 2 Mg Tabs (Pitavastatin Calcium) .... One By Mouth Weekly  Current Medications (verified): 1)  Valtrex 500 Mg  Tabs (Valacyclovir Hcl) .... One Every Day As Needed 2)  Alprazolam 1 Mg  Tabs (Alprazolam) .Marland Kitchen.. 1 To 1/2 in Am As Needed 3)  Astelin 137 Mcg/spray  Soln (Azelastine Hcl) .... Two Sprays Q Nare  Bid 4)  Sonata 10 Mg  Caps (Zaleplon) .... One By Mouth As Needed Sleep Interuption 5)  Aciphex 20 Mg  Tbec (Rabeprazole Sodium) .... One By Mouth Two Times A Day 6)  Testosterone Cypionate 200 Mg/ml Oil (Testosterone Cypionate) .... 1/2 Cc Im   Every Other Week 7)  Diazepam 5 Mg Tabs (Diazepam) .... One By Mouth Tid For Muscle Spasm As Needed 8)  Tramadol Hcl 50 Mg Tabs (Tramadol Hcl) .... One To Two By Mouth Every 6-8 Hours With A Nonsteroidal 9)  Nasonex 50 Mcg/act Susp (Mometasone Furoate) .... Two Spray Each Nostril Daily 10)  Livalo 2 Mg Tabs (Pitavastatin Calcium) .... One By Mouth Weekly  Allergies (verified): 1)  ! Erythromycin 2)  ! Vicodin  Past History:  Family History: Last updated: 11/22/2006 Family History Other cancer-Colon PGM Family History of Prostate CA 1st degree relative <50 Father Family History of Cardiovascular disorder PAD in mother with carotid surgery Fam hx MI  Social History: Last updated: 11/22/2006 Never Smoked Alcohol use-yes Drug use-no Domestic Partner  Risk Factors: Smoking Status: never (07/07/2009)  Past medical, surgical, family and social histories (including risk factors) reviewed, and no changes noted (except as noted below).  Past Medical History: Reviewed history from 11/22/2006 and no changes required. 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 Inguinal herniorrhaphy Orchiectomy back surg 2008 Lumbar laminectomy Lumbar fusion  Family History: Reviewed history from 11/22/2006 and no changes required. Family History Other cancer-Colon PGM Family History of Prostate CA 1st degree relative <50 Father Family History of Cardiovascular disorder PAD in mother with carotid surgery Fam hx MI  Social History: Reviewed history from 11/22/2006 and no changes required. 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         gERD  Physical Exam  General:  Well-developed,well-nourished,in no acute distress; alert,appropriate and cooperative throughout examination Head:  Normocephalic and atraumatic without obvious abnormalities. No apparent alopecia or balding.Sinuses nontender. Eyes:  Conjunctiva clear bilaterally.  Ears:  R ear normal and L ear normal.   Nose:  no external deformity and no nasal discharge.   Mouth:  good dentition and pharynx pink and moist.   Neck:  supple.   Lungs:  normal respiratory effort and no wheezes.   Heart:  normal rate and regular rhythm.   Abdomen:  soft and non-tender.   Msk:  normal ROM and no joint tenderness.  Neurologic:  alert & oriented X3 and gait normal.     Impression & Recommendations:  Problem # 1:  GERD (ICD-530.81)  His updated medication list for this problem includes:    Zegerid 40-1100 Mg Caps (Omeprazole-sodium bicarbonate) ..... One by mouth daily at hs    Magnesium Oxide 250 Mg Tabs (Magnesium oxide) ..... One by mouth daily ( slow mag samples)  Labs Reviewed: Hgb: 15.0 (03/24/2009)   Hct: 44.1 (03/24/2009)  Complete Medication List: 1)  Valtrex 500 Mg Tabs (Valacyclovir hcl) .... One every day as needed 2)  Alprazolam 1 Mg Tabs (Alprazolam) .Marland Kitchen.. 1 to 1/2 in am as needed 3)  Astelin 137 Mcg/spray Soln (Azelastine hcl) .... Two sprays q nare bid 4)  Sonata 10 Mg Caps (Zaleplon) ....  One by mouth as needed sleep interuption 5)  Zegerid 40-1100 Mg Caps (Omeprazole-sodium bicarbonate) .... One by mouth daily at hs 6)  Testosterone Cypionate 200 Mg/ml Oil (Testosterone cypionate) .... 1/2 cc im   every other week 7)  Diazepam 5 Mg Tabs (Diazepam) .... One by mouth tid for muscle spasm as needed 8)  Tramadol Hcl 50 Mg Tabs (Tramadol hcl) .... One to two by mouth every 6-8 hours with a nonsteroidal 9)  Nasonex 50 Mcg/act Susp (Mometasone furoate) .... Two spray each nostril daily 10)  Magnesium Oxide 250 Mg Tabs (Magnesium oxide) .... One by mouth daily ( slow mag samples) 11)  Krill Oil 1000 Mg Caps (Krill oil) .... One by mouth two times a day 12)  Co Q-10 Plus Red Yeast Rice 60-600 Mg Caps (Coenzyme q10-red yeast rice) .... One by mouth two times a day 13)  Phentermine Hcl 37.5 Mg Caps (Phentermine hcl) .... One by mouth in am  Patient Instructions: 1)  1. use a weight loss drug to cntrol appetite 2)  2. "red rice yeast" at the health food store 3)  3. kril oil or fish oil  two caps a day 4)  Please schedule a follow-up appointment in 3 months. 5)  Hepatic Panel prior to visit, ICD-9:995.20 6)  Lipid Panel prior to visit, ICD-9:272.4 7)  DASH diet    carb control!!!!!! Prescriptions: PHENTERMINE HCL 37.5 MG CAPS (PHENTERMINE HCL) one by mouth in AM  #30 x 3   Entered and Authorized by:   Stacie Glaze MD   Signed by:   Stacie Glaze MD on 07/07/2009   Method used:   Print then Give to Patient   RxID:   [id number] ZEGERID 40-1100 MG CAPS (OMEPRAZOLE-SODIUM BICARBONATE) one by mouth daily at HS  #30 x 11   Entered and Authorized by:   Stacie Glaze MD   Signed by:   Stacie Glaze MD on 07/07/2009   Method used:   Print then Give to Patient   RxID:   [id number] LIVALO 2 MG TABS (PITAVASTATIN CALCIUM) one by mouth weekly  #5 x 6   Entered by:   Willy Eddy, LPN   Authorized by:   Stacie Glaze MD   Signed by:   Willy Eddy, LPN on  84/69/6295   Method used:   Print then Give to Patient   RxID:   [id number] DIAZEPAM 5 MG TABS (DIAZEPAM) ONE by mouth Tid for muscle spasm as needed  #30 x 3   Entered by:   Willy Eddy, LPN   Authorized by:   Stacie Glaze MD   Signed by:   Willy Eddy, LPN on  07/07/2009   Method used:   Print then Give to Patient   RxID:   [id number] SONATA 10 MG  CAPS (ZALEPLON) one by mouth as needed sleep interuption  #30 x 5   Entered by:   Willy Eddy, LPN   Authorized by:   Stacie Glaze MD   Signed by:   Willy Eddy, LPN on 99/83/3825   Method used:   Print then Give to Patient   RxID:   [id number] VALTREX 500 MG  TABS (VALACYCLOVIR HCL) one every day as needed  #30.0 Each x 5   Entered by:   Willy Eddy, LPN   Authorized by:   Stacie Glaze MD   Signed by:   Willy Eddy, LPN on 40/97/3532   Method used:   Print then Give to Patient   RxID:   [id number]

## 2010-05-25 NOTE — Medication Information (Signed)
Summary: Prior Authorization and Approval for Androderm  Prior Authorization and Approval for Androderm   Imported By: Maryln Gottron 03/31/2010 15:58:34  _____________________________________________________________________  External Attachment:    Type:   Image     Comment:   External Document

## 2010-05-25 NOTE — Progress Notes (Signed)
Summary: refill at new pharmacy  Phone Note Refill Request Call back at [phone] Message from:  james kent---triage vm on *****new pharmacy info******  Refills Requested: Medication #1:  ACIPHEX 20 MG TBEC 1 two times a day send to new pharmacy----[facility]----ph----[phone]  Initial call taken by: Warnell Forester,  March 15, 2010 11:32 AM Caller: Fayrene Fearing kent----triage vm    Prescriptions: ACIPHEX 20 MG TBEC (RABEPRAZOLE SODIUM) 1 two times a day  #60 x 3   Entered by:   Willy Eddy, LPN   Authorized by:   Stacie Glaze MD   Signed by:   Willy Eddy, LPN on 62/70/3500   Method used:   Electronically to        [facility]* (retail)       [address]       [place], [place]  [address]       Ph: [phone]       Fax: [phone]   RxID:   [id number]

## 2010-05-25 NOTE — Progress Notes (Signed)
Summary: pain meds cause itching.  Phone Note Call from Patient   Caller: Patient Call For: Stacie Glaze MD Reason for Call: Talk to Doctor Summary of Call: Pt wants to change pain meds.......Marland KitchenTramadol and Advil is now making him itch also.  [facility]).  It helps the pain, but he itches all night. [phone] Initial call taken by: Lynann Beaver CMA,  Sep 07, 2009 4:17 PM  Follow-up for Phone Call        take a benadryl at night an seee if this will fix it otherwise will consider changing medicatio per dr Lovell Sheehan Follow-up by: Willy Eddy, LPN,  Sep 07, 2009 5:03 PM  Additional Follow-up for Phone Call Additional follow up Details #1::        pt informed Additional Follow-up by: Willy Eddy, LPN,  Sep 07, 2009 5:11 PM

## 2010-05-25 NOTE — Assessment & Plan Note (Signed)
Summary: sore throat/earache/congestion/cjr   Vital Signs:  Patient profile:   50 year old male Weight:      194 pounds Temp:     98.6 degrees F oral BP sitting:   120 / 70  (left arm) Cuff size:   regular  Vitals Entered By: Sid Falcon LPN (February 16, 2010 9:40 AM)  History of Present Illness: Patient seen as a work in with onset about 5 days ago sore throat. Has mild right earache, nasal congestion, occasional dry cough. Some sneezing. No fever. Sore throat symptoms worse at night. Advil helps some. Nonsmoker. History of frequent strep pharyngitis in past.   symptoms of moderate severity.  Allergies: 1)  ! Erythromycin 2)  ! Vicodin  Past History:  Past Medical History: Last updated: 11/22/2006 IBS Lumbar Disc  Genital Herpes Hypogonadism with low testosterone  s/p orchiectomy PMH reviewed for relevance  Physical Exam  General:  Well-developed,well-nourished,in no acute distress; alert,appropriate and cooperative throughout examination Ears:  possible early small right effusion but no erythema. Left normal Nose:  External nasal examination shows no deformity or inflammation. Nasal mucosa are pink and moist without lesions or exudates. Mouth:  Oral mucosa and oropharynx without lesions or exudates.  Teeth in good repair. Neck:  No deformities, masses, or tenderness noted. Lungs:  Normal respiratory effort, chest expands symmetrically. Lungs are clear to auscultation, no crackles or wheezes. Heart:  Normal rate and regular rhythm. S1 and S2 normal without gallop, murmur, click, rub or other extra sounds.   Impression & Recommendations:  Problem # 1:  VIRAL URI (ICD-465.9)  rapid strep negative. Treat symptomatically  Orders: Rapid Strep (16109)  Complete Medication List: 1)  Valtrex 500 Mg Tabs (Valacyclovir hcl) .... One every day as needed 2)  Alprazolam 1 Mg Tabs (Alprazolam) .Marland Kitchen.. 1 to 1/2 in am as needed 3)  Sonata 10 Mg Caps (Zaleplon) .... One by mouth  as needed sleep interuption 4)  Aciphex 20 Mg Tbec (Rabeprazole sodium) .Marland Kitchen.. 1 two times a day 5)  Diazepam 5 Mg Tabs (Diazepam) .... One by mouth tid for muscle spasm as needed 6)  Tramadol Hcl 50 Mg Tabs (Tramadol hcl) .... One to two by mouth every 6-8 hours with a nonsteroidal 7)  Nasonex 50 Mcg/act Susp (Mometasone furoate) .... Two spray each nostril daily 8)  Krill Oil 1000 Mg Caps (Krill oil) .... One by mouth two times a day 9)  Co Q-10 Plus Red Yeast Rice 60-600 Mg Caps (Coenzyme q10-red yeast rice) .... One by mouth two times a day 10)  Red Yeast Rice 600 Mg Caps (Red yeast rice extract) .Marland Kitchen.. 1 once daily   Orders Added: 1)  Rapid Strep [87880] 2)  Est. Patient Level III [60454]

## 2010-05-27 NOTE — Assessment & Plan Note (Signed)
Summary: cpx---ok per dr//ccm   Vital Signs:  Patient profile:   50 year old male Height:      68 inches Weight:      190 pounds BMI:     28.99 Temp:     98.4 degrees F oral Pulse rate:   72 / minute Resp:     14 per minute BP sitting:   130 / 82  (left arm)  Vitals Entered By: Willy Eddy, LPN (March 26, 2010 9:36 AM) CC: cpx, Lipid Management Is Patient Diabetic? No   Primary Care Provider:  Stacie Glaze MD  CC:  cpx and Lipid Management.  History of Present Illness: The pt was asked about all immunizations, health maint. services that are appropriate to their age and was given guidance on diet exercize  and weight management   Lipid Management History:      Positive NCEP/ATP III risk factors include male age 55 years old or older.  Negative NCEP/ATP III risk factors include non-diabetic, no family history for ischemic heart disease, non-tobacco-user status, no ASHD (atherosclerotic heart disease), no prior stroke/TIA, no peripheral vascular disease, and no history of aortic aneurysm.     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) 4)  Contact or Exposure To Other Viral Diseases  (ICD-V01.79) 5)  Benign Prostatic Hypertrophy, With Urinary Obstruction  (ICD-600.01) 6)  Cellulitis and Abscess of Upper Arm and Forearm  (ICD-682.3) 7)  Gerd  (ICD-530.81) 8)  Chest Pain  (ICD-786.50) 9)  Gastritis, Acute  (ICD-535.00) 10)  Panic Attack, Acute  (ICD-300.01) 11)  Persistent Disorder Initiating/maintaining Sleep  (ICD-307.42) 12)  Other Chronic Sinusitis  (ICD-473.8) 13)  Hyperlipidemia Nec/nos  (ICD-272.4) 14)  Testosterone Deficiency  (ICD-257.2) 15)  Irritable Bowel Syndrome  (ICD-564.1) 16)  Lumbar Radiculopathy, Left  (ICD-724.4) 17)  Herpes Simplex, Uncomplicated  (ICD-054.9) 18)   Preventive Health Care  (ICD-V70.0)  Current Problems (verified): 1)  Myalgia  (ICD-729.1) 2)  Hyperlipidemia, 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) 3)  Contact or Exposure To Other Viral Diseases  (ICD-V01.79) 4)  Benign Prostatic Hypertrophy, With Urinary Obstruction  (ICD-600.01) 5)  Cellulitis and Abscess of Upper Arm and Forearm  (ICD-682.3) 6)  Gerd  (ICD-530.81) 7)  Chest Pain  (ICD-786.50) 8)  Gastritis, Acute  (ICD-535.00) 9)  Panic Attack, Acute  (ICD-300.01) 10)  Persistent Disorder Initiating/maintaining Sleep  (ICD-307.42) 11)  Other Chronic Sinusitis  (ICD-473.8) 12)  Hyperlipidemia Nec/nos  (ICD-272.4) 13)  Testosterone Deficiency  (ICD-257.2) 14)  Irritable Bowel Syndrome  (ICD-564.1) 15)  Lumbar Radiculopathy, Left  (ICD-724.4) 16)  Herpes Simplex, Uncomplicated  (ICD-054.9) 17)  Preventive Health Care  (ICD-V70.0)  Medications Prior to Update: 1)  Valtrex 500 Mg  Tabs (Valacyclovir Hcl) .... One Every Day As Needed 2)  Alprazolam 1 Mg  Tabs (Alprazolam) .Marland Kitchen.. 1 To 1/2 in Am As Needed 3)  Sonata 10 Mg  Caps (Zaleplon) .... One By Mouth As Needed Sleep Interuption 4)  Aciphex 20 Mg Tbec (Rabeprazole Sodium) .Marland Kitchen.. 1 Two Times A Day 5)  Diazepam 5 Mg Tabs (Diazepam) .... One By Mouth Tid For Muscle Spasm As Needed 6)  Tramadol Hcl 50 Mg Tabs (Tramadol Hcl) .... One To Two By Mouth Every 6-8 Hours With A Nonsteroidal 7)  Nasonex 50 Mcg/act Susp (Mometasone Furoate) .... Two Spray Each Nostril Daily  8)  Krill Oil 1000 Mg Caps (Krill Oil) .... One By Mouth Two Times A Day 9)  Co Q-10 Plus Red Yeast Rice 60-600 Mg Caps (Coenzyme Q10-Red Yeast Rice) .... One By Mouth Two Times A Day 10)  Red Yeast Rice 600 Mg Caps (Red Yeast Rice Extract) .Marland Kitchen.. 1 Once Daily  Current Medications (verified): 1)  Valtrex 500 Mg  Tabs (Valacyclovir Hcl) .... One Every Day As Needed 2)  Alprazolam 1 Mg  Tabs (Alprazolam) .Marland Kitchen.. 1 To 1/2 in Am As Needed 3)  Sonata 10 Mg  Caps (Zaleplon) .... One By Mouth As Needed  Sleep Interuption 4)  Aciphex 20 Mg Tbec (Rabeprazole Sodium) .Marland Kitchen.. 1 Two Times A Day 5)  Diazepam 5 Mg Tabs (Diazepam) .... One By Mouth Tid For Muscle Spasm As Needed 6)  Tramadol Hcl 50 Mg Tabs (Tramadol Hcl) .... One To Two By Mouth Every 6-8 Hours With A Nonsteroidal 7)  Nasonex 50 Mcg/act Susp (Mometasone Furoate) .... Two Spray Each Nostril Daily 8)  Krill Oil 1000 Mg Caps (Krill Oil) .... One By Mouth Two Times A Day 9)  Co Q-10 Plus Red Yeast Rice 60-600 Mg Caps (Coenzyme Q10-Red Yeast Rice) .... One By Mouth Two Times A Day 10)  Red Yeast Rice 600 Mg Caps (Red Yeast Rice Extract) .Marland Kitchen.. 1 Once Daily 11)  Axiron 30 Mg/act Soln (Testosterone) .... Apply One Aplicator  To  Alternating Underarms Daily  Allergies (verified): 1)  ! Erythromycin 2)  ! Vicodin  Past History:  Family History: Last updated: 11/22/2006 Family History Other cancer-Colon PGM Family History of Prostate CA 1st degree relative <50 Father Family History of Cardiovascular disorder PAD in mother with carotid surgery Fam hx MI  Social History: Last updated: 11/22/2006 Never Smoked Alcohol use-yes Drug use-no Domestic Partner  Risk Factors: Smoking Status: never (03/26/2010)  Past medical, surgical, family and social histories (including risk factors) reviewed, and no changes noted (except as noted below).  Past Medical History: Reviewed history from 11/22/2006 and no changes required. 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 Inguinal herniorrhaphy Orchiectomy back surg 2008 Lumbar laminectomy Lumbar fusion  Family History: Reviewed history from 11/22/2006 and no changes required. Family History Other cancer-Colon PGM Family History of Prostate CA 1st degree relative <50 Father Family History of Cardiovascular disorder PAD in mother with carotid surgery Fam hx  MI  Social History: Reviewed history from 11/22/2006 and no changes required. 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    Physical Exam  General:  Well-developed,well-nourished,in no acute distress; alert,appropriate and cooperative throughout examination Head:  Normocephalic and atraumatic without obvious abnormalities. No apparent alopecia or balding.Sinuses nontender. Eyes:  Conjunctiva clear bilaterally.  Ears:  possible early small right effusion but no erythema. Left normal Nose:  External nasal examination shows no deformity or inflammation. Nasal mucosa are pink and moist without lesions or exudates. Mouth:  Oral mucosa and oropharynx without lesions or exudates.  Teeth in good repair. Neck:  No deformities, masses, or tenderness  noted. Lungs:  Normal respiratory effort, chest expands symmetrically. Lungs are clear to auscultation, no crackles or wheezes. Heart:  Normal rate and regular rhythm. S1 and S2 normal without gallop, murmur, click, rub or other extra sounds. Rectal:  normal sphincter tone and no masses.   Genitalia:  circumcised and no urethral discharge.   Prostate:  no asymmetry and 1+ enlarged.   Msk:  no joint tenderness, no joint swelling, and no joint warmth.   Pulses:  R and L carotid,radial,femoral,dorsalis pedis and posterior tibial pulses are full and equal bilaterally Extremities:  No clubbing, cyanosis,  edema, or deformity noted with normal full range of motion of all joints.   Neurologic:  alert & oriented X3 and gait normal.   Skin:  susp mole on right lower back and mid chest   Impression & Recommendations:  Problem # 1:  HYPERLIPIDEMIA, TYPE IV (ICD-272.4)  Labs Reviewed: SGOT: 25 (03/12/2010)   SGPT: 39 (03/12/2010)  Lipid Goals: Chol Goal: 200 (06/26/2009)   HDL Goal: 40 (06/26/2009)   LDL Goal: 160 (06/26/2009)   TG Goal: 150 (06/26/2009)  Prior 10 Yr Risk Heart Disease: 6 % (06/26/2009)   HDL:47.50 (06/30/2009), 41.50 (03/24/2009)  LDL:115 (03/03/2008), 118 (07/06/2007)  Chol:220 (06/30/2009), 241 (03/24/2009)  Trig:326.0 (06/30/2009), 218.0 (03/24/2009)  Orders: Venipuncture (04540) Specimen Handling (98119) TLB-Lipid Panel (80061-LIPID)  Problem # 2:  TESTOSTERONE DEFICIENCY (ICD-257.2) low testosterone  Problem # 3:  PREVENTIVE HEALTH CARE (ICD-V70.0) The pt was asked about all immunizations, health maint. services that are appropriate to their age and was given guidance on diet exercize  and weight management  Td Booster: Tdap (03/31/2009)   Flu Vax: Fluvax 3+ (01/28/2010)   Chol: 220 (06/30/2009)   HDL: 47.50 (06/30/2009)   LDL: 115 (03/03/2008)   TG: 326.0 (06/30/2009) TSH: 5.50 (03/12/2010)   PSA: 0.60 (03/12/2010)  Discussed using sunscreen, use of alcohol, drug use, self testicular exam, routine dental care, routine eye care, routine physical exam, seat belts, multiple vitamins, osteoporosis prevention, adequate calcium intake in diet, and recommendations for immunizations.  Discussed exercise and checking cholesterol.  Discussed gun safety, safe sex, and contraception. Also recommend checking PSA.  Problem # 4:  IRRITABLE BOWEL SYNDROME (ICD-564.1) Assessment: Improved  Complete Medication List: 1)  Valtrex 500 Mg Tabs (Valacyclovir hcl) .... One every day as needed 2)  Alprazolam 1 Mg Tabs (Alprazolam) .Marland Kitchen.. 1 to 1/2 in am as needed 3)  Sonata 10 Mg Caps  (Zaleplon) .... One by mouth as needed sleep interuption 4)  Aciphex 20 Mg Tbec (Rabeprazole sodium) .Marland Kitchen.. 1 two times a day 5)  Diazepam 5 Mg Tabs (Diazepam) .... One by mouth tid for muscle spasm as needed 6)  Tramadol Hcl 50 Mg Tabs (Tramadol hcl) .... One to two by mouth every 6-8 hours with a nonsteroidal 7)  Nasonex 50 Mcg/act Susp (Mometasone furoate) .... Two spray each nostril daily 8)  Krill Oil 1000 Mg Caps (Krill oil) .... One by mouth two times a day 9)  Co Q-10 Plus Red Yeast Rice 60-600 Mg Caps (Coenzyme q10-red yeast rice) .... One by mouth two times a day 10)  Red Yeast Rice 600 Mg Caps (Red yeast rice extract) .Marland Kitchen.. 1 once daily 11)  Axiron 30 Mg/act Soln (Testosterone) .... Apply one aplicator  to  alternating underarms daily  Other Orders: TLB-T4 (Thyrox), Free [id number]) TLB-T3, Free (Triiodothyronine) (84481-T3FREE)  Lipid Assessment/Plan:      Based on NCEP/ATP III, the patient's risk factor category is "2 or  more risk factors and a calculated 10 year CAD risk of > 20%".  The patient's lipid goals are as follows: Total cholesterol goal is 200; LDL cholesterol goal is 160; HDL cholesterol goal is 40; Triglyceride goal is 150.    Patient Instructions: 1)  3 month mole remoalve for moles on left lower back and mid chest 2)  Please schedule a follow-up appointment in 3 months. Prescriptions: AXIRON 30 MG/ACT SOLN (TESTOSTERONE) apply one aplicator  to  alternating underarms daily  #1 x 3   Entered and Authorized by:   Stacie Glaze MD   Signed by:   Stacie Glaze MD on 03/26/2010   Method used:   Print then Give to Patient   RxID:   [id number] DIAZEPAM 5 MG TABS (DIAZEPAM) ONE by mouth Tid for muscle spasm as needed  #30 x 3   Entered and Authorized by:   Stacie Glaze MD   Signed by:   Stacie Glaze MD on 03/26/2010   Method used:   Print then Give to Patient   RxID:   [id number]    Orders Added: 1)  Venipuncture [62376] 2)  Est. Patient 40-64  years [99396] 3)  Est. Patient Level III [28315] 4)  Specimen Handling [99000] 5)  TLB-Lipid Panel [80061-LIPID] 6)  TLB-T4 (Thyrox), Free [17616-WV3X] 7)  TLB-T3, Free (Triiodothyronine) [10626-R4WNIO]

## 2010-05-27 NOTE — Progress Notes (Signed)
Summary: REQUEST FOR LAB RESULTS  Phone Note Call from Patient   Caller: Patient   [phone] Summary of Call: Pt would like to obtain results of recent labwork.... Pt can be reached at [phone].  Initial call taken by: Debbra Riding,  April 15, 2010 3:12 PM  Follow-up for Phone Call        pt informed- will cut out so much sugar and cut out fat- axeron is causing him to break out- will try other sites Follow-up by: Willy Eddy, LPN,  April 16, 2010 9:25 AM

## 2010-06-17 ENCOUNTER — Encounter: Payer: Self-pay | Admitting: Internal Medicine

## 2010-06-17 ENCOUNTER — Ambulatory Visit (INDEPENDENT_AMBULATORY_CARE_PROVIDER_SITE_OTHER): Payer: BC Managed Care – PPO | Admitting: Internal Medicine

## 2010-06-17 VITALS — BP 110/78 | HR 80 | Temp 98.7°F | Resp 14 | Ht 67.0 in | Wt 191.0 lb

## 2010-06-17 DIAGNOSIS — J209 Acute bronchitis, unspecified: Secondary | ICD-10-CM

## 2010-06-17 DIAGNOSIS — J45901 Unspecified asthma with (acute) exacerbation: Secondary | ICD-10-CM

## 2010-06-17 MED ORDER — AZITHROMYCIN 250 MG PO TABS: 250.0000 mg | ORAL_TABLET | Freq: Every day | ORAL | Status: AC

## 2010-06-17 MED ORDER — PSEUDOEPH-CHLORPHEN-HYDROCOD 60-4-5 MG/5ML PO SOLN: 5.0000 mL | Freq: Two times a day (BID) | ORAL | Status: DC

## 2010-06-17 NOTE — Progress Notes (Signed)
  Subjective:    Patient ID: Jon Richardson, male    DOB: 1960-08-07, 50 y.o.   MRN: [id number]  HPI the patient is a 50 year old white male who presents for a one-week history of cough that has become progressively worse with chest tightness and shortness of breath.   He he states that this infection began approximately one week ago with a runny nose and upper respiratory tract congestion and has progressed to a productive cough with thick sputum and moderate shortness of breath.  he is a nonsmoker with no history of asthma but has detected some end expiratory wheezing and states that he has had to "sleep sitting up"     Review of Systems  Constitutional: Negative for fever and fatigue.  HENT: Positive for congestion. Negative for hearing loss, neck pain, postnasal drip and sinus pressure.   Eyes: Negative for discharge, redness and visual disturbance.  Respiratory: Positive for cough, chest tightness, shortness of breath and wheezing.   Cardiovascular: Negative for leg swelling.  Gastrointestinal: Negative for abdominal pain, constipation and abdominal distention.  Genitourinary: Negative for urgency and frequency.  Musculoskeletal: Negative for joint swelling and arthralgias.  Skin: Negative for color change and rash.  Neurological: Negative for weakness and light-headedness.  Hematological: Negative for adenopathy.  Psychiatric/Behavioral: Negative for behavioral problems.   Past Medical History  Diagnosis Date  . BENIGN PROSTATIC HYPERTROPHY, WITH URINARY OBSTRUCTION 07/22/2008  . GERD 11/12/2007  . HERPES SIMPLEX, UNCOMPLICATED 11/20/2006  . LUMBAR RADICULOPATHY, LEFT 07/22/2008  . MYALGIA 07/07/2009  . Other and unspecified hyperlipidemia 11/22/2006  . Other chronic sinusitis 05/30/2007  . 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 PANIC ATTACK, ACUTE 07/11/2007   History reviewed. No pertinent past surgical history.  reports that he has never smoked. He does not have any  smokeless tobacco history on file. He reports that he does not drink alcohol or use illicit drugs. family history is not on file.        Objective:   Physical Exam  Constitutional: He is oriented to person, place, and time. He appears well-developed and well-nourished.  HENT:  Head: Normocephalic and atraumatic.  Eyes: Conjunctivae are normal. Pupils are equal, round, and reactive to light.  Neck: Normal range of motion. Neck supple.  Cardiovascular: Normal rate and regular rhythm.   Pulmonary/Chest: Effort normal. He has wheezes. He exhibits tenderness.  Abdominal: Soft. Bowel sounds are normal.  Musculoskeletal: Normal range of motion.  Neurological: He is alert and oriented to person, place, and time.  Skin: Skin is warm and dry.  Psychiatric: Judgment and thought content normal.          Assessment & Plan:   This patient has acute rhonchi this but appears to be bacterial in nature DT the tightness in his chest and increased shortness of breath he could be evolving into an early pneumonia he'll be given azithromycin and a prescription for a codeine containing cough medication. He is instructed to monitor his symptoms the shortness of breath worsen he'll contact our office or the after hours presented to the emergency room if his symptoms do not prove in the next 48 hours would recommend a chest x-ray and a return office visit prior to the weekend he expressed understanding of these instructions and will call us with an update in 2 days

## 2010-06-29 ENCOUNTER — Encounter: Payer: Self-pay | Admitting: Internal Medicine

## 2010-06-29 ENCOUNTER — Ambulatory Visit (INDEPENDENT_AMBULATORY_CARE_PROVIDER_SITE_OTHER): Payer: BC Managed Care – PPO | Admitting: Internal Medicine

## 2010-06-29 ENCOUNTER — Other Ambulatory Visit: Payer: Self-pay | Admitting: Internal Medicine

## 2010-06-29 DIAGNOSIS — L578 Other skin changes due to chronic exposure to nonionizing radiation: Secondary | ICD-10-CM

## 2010-06-29 DIAGNOSIS — D229 Melanocytic nevi, unspecified: Secondary | ICD-10-CM

## 2010-06-29 NOTE — Progress Notes (Signed)
  Subjective:    Patient ID: Jon Richardson, male    DOB: [birth year], 50 y.o.   MRN: [id number]  HPI  Mole removal three moles and cryo to face two lesions  Review of Systems     Objective:   Physical Exam        Assessment & Plan:

## 2010-06-30 ENCOUNTER — Encounter: Payer: Self-pay | Admitting: Gastroenterology

## 2010-07-05 ENCOUNTER — Other Ambulatory Visit: Payer: Self-pay | Admitting: Internal Medicine

## 2010-07-12 NOTE — Telephone Encounter (Signed)
error 

## 2010-07-21 NOTE — Progress Notes (Signed)
Pt informed

## 2010-07-29 ENCOUNTER — Other Ambulatory Visit: Payer: Self-pay | Admitting: Internal Medicine

## 2010-07-29 DIAGNOSIS — F419 Anxiety disorder, unspecified: Secondary | ICD-10-CM

## 2010-08-23 ENCOUNTER — Ambulatory Visit (INDEPENDENT_AMBULATORY_CARE_PROVIDER_SITE_OTHER): Payer: BC Managed Care – PPO | Admitting: Family Medicine

## 2010-08-23 ENCOUNTER — Encounter: Payer: Self-pay | Admitting: Family Medicine

## 2010-08-23 VITALS — BP 100/70 | Temp 96.6°F

## 2010-08-23 DIAGNOSIS — M6740 Ganglion, unspecified site: Secondary | ICD-10-CM

## 2010-08-23 NOTE — Patient Instructions (Signed)
Ganglion A ganglion is a swelling under the skin that is filled with a thick, jelly-like substance. It is a synovial cyst. This is caused by a break (rupture) of the joint lining from the joint space. A ganglion often occurs near an area of repeated minor trauma (damage caused by an accident). Trauma may also be a repetitive movement at work or in a sport. TREATMENT It often goes away without treatment. It may reappear later. Sometimes a ganglion may need to be surgically removed. Often they are drained and injected with a steroid. Sometimes they respond to:  Rest.   Occasionally splinting helps.  HOME CARE INSTRUCTIONS  Your caregiver will decide the best way of treating your ganglion. Do not try to break the ganglion yourself by pressing on it, poking it with a needle, or hitting it with a heavy object.   Use medications as directed.  1.  2. SEEK MEDICAL CARE IF:   The ganglion becomes larger or more painful.   You have increased redness or swelling.   You have weakness or numbness in your hand or wrist.  MAKE SURE YOU:   Understand these instructions.   Will watch your condition.   Will get help right away if you are not doing well or get worse.  Document Released: 04/08/2000 Document Re-Released: 07/08/2008 [employer] Patient Information 2011 [employer]. 

## 2010-08-23 NOTE — Progress Notes (Signed)
  Subjective:    Patient ID: Jon Richardson, male    DOB: May 27, 1960, 50 y.o.   MRN: [id number]  HPI Left ring finger pain for one month. No specific injury. First noted after doing some yard work. The patient is volar surface at MCP joint. No significant swelling. Nodular quality. Pain is mild. No trigger finger. No skin changes.   Review of Systems  Neurological: Negative for weakness and numbness.  Hematological: Negative for adenopathy. Does not bruise/bleed easily.       Objective:   Physical Exam  Constitutional: He appears well-developed and well-nourished.  Cardiovascular: Normal rate and regular rhythm.   Pulmonary/Chest: Effort normal and breath sounds normal.  Musculoskeletal:       Left ring finger reveals small cystic swelling volar surface of the metacarpophalangeal joint approximately 4 mm diameter. Minimally tender. No trigger finger          Assessment & Plan:  Ganglion cyst left ring finger. Reassurance given. No treatment unless becomes more painful

## 2010-09-06 ENCOUNTER — Telehealth: Payer: Self-pay | Admitting: Internal Medicine

## 2010-09-06 DIAGNOSIS — M6740 Ganglion, unspecified site: Secondary | ICD-10-CM

## 2010-09-06 MED ORDER — CELECOXIB 200 MG PO CAPS: 200.0000 mg | ORAL_CAPSULE | Freq: Every day | ORAL | Status: DC

## 2010-09-06 NOTE — Telephone Encounter (Signed)
Maybe a referral to a hand surgeon and may have a refill of Celebrex 200 mg by mouth daily

## 2010-09-06 NOTE — Telephone Encounter (Signed)
Pt. Notified and Celebrex sent to pharmacy

## 2010-09-06 NOTE — Telephone Encounter (Signed)
Pt would like a referral to hand surgery for ganglion cyst dx by dr Caryl Never. Pt is requesting new rx celebrex ?mg for joint pains call into [facility] [phone].

## 2010-09-07 NOTE — Assessment & Plan Note (Signed)
[facility]                                 ON-CALL NOTE   NAME:Jon Richardson, Jon Richardson                    MRN:          [id number]  DATE:09/01/2007                            DOB:          04/10/1961    Verlin Dike, date of birth August 14, 1962.   DATE OF INTERACTION:  Sep 01, 2007, at 10:38 a.m.   PHONE NUMBER:  [phone].   OBJECTIVE:  The patient has a cough and a rattle in the chest, would  like to be seen, and was told to come in and be seen in the clinic.   PRIMARY CARE [facility]:  Dr. Lovell Sheehan.  Home office is [place].     Arta Silence, MD  Electronically Signed    RNS/MedQ  DD: 09/01/2007  DT: 09/01/2007  Job #: [id number]

## 2010-09-14 ENCOUNTER — Other Ambulatory Visit: Payer: Self-pay | Admitting: Internal Medicine

## 2010-09-23 ENCOUNTER — Other Ambulatory Visit: Payer: Self-pay | Admitting: Internal Medicine

## 2010-09-30 ENCOUNTER — Ambulatory Visit (INDEPENDENT_AMBULATORY_CARE_PROVIDER_SITE_OTHER): Payer: BC Managed Care – PPO | Admitting: Internal Medicine

## 2010-09-30 ENCOUNTER — Encounter: Payer: Self-pay | Admitting: Internal Medicine

## 2010-09-30 VITALS — BP 114/80 | HR 72 | Temp 98.2°F | Resp 16 | Ht 67.0 in | Wt 192.0 lb

## 2010-09-30 DIAGNOSIS — E291 Testicular hypofunction: Secondary | ICD-10-CM

## 2010-09-30 DIAGNOSIS — L570 Actinic keratosis: Secondary | ICD-10-CM

## 2010-09-30 DIAGNOSIS — Z0000 Encounter for general adult medical examination without abnormal findings: Secondary | ICD-10-CM

## 2010-09-30 DIAGNOSIS — S161XXA Strain of muscle, fascia and tendon at neck level, initial encounter: Secondary | ICD-10-CM

## 2010-09-30 LAB — POCT URINALYSIS DIPSTICK
Glucose, UA: NEGATIVE
Nitrite, UA: NEGATIVE
Spec Grav, UA: 1.005
Urobilinogen, UA: 0.2

## 2010-09-30 NOTE — Progress Notes (Signed)
  Subjective:    Patient ID: Jon Richardson, male    DOB: Jun 18, 1960, 50 y.o.   MRN: [id number]  HPI  4 week hx of neck strain Blood pressure is good No chest pain Exercise plan discussed celebrex helping The pt has a lesion on his nose that is hyperkeratinized Patient is tolerating the testosterone will with increased sense of well-being.       Review of Systems  Constitutional: Negative for fever and fatigue.  HENT: Negative for hearing loss, congestion, neck pain and postnasal drip.   Eyes: Negative for discharge, redness and visual disturbance.  Respiratory: Negative for cough, shortness of breath and wheezing.   Cardiovascular: Negative for leg swelling.  Gastrointestinal: Negative for abdominal pain, constipation and abdominal distention.  Genitourinary: Negative for urgency and frequency.  Musculoskeletal: Negative for joint swelling and arthralgias.  Skin: Negative for color change and rash.  Neurological: Negative for weakness and light-headedness.  Hematological: Negative for adenopathy.  Psychiatric/Behavioral: Negative for behavioral problems.   Past Medical History  Diagnosis Date  . BENIGN PROSTATIC HYPERTROPHY, WITH URINARY OBSTRUCTION 07/22/2008  . GERD 11/12/2007  . HERPES SIMPLEX, UNCOMPLICATED 11/20/2006  . LUMBAR RADICULOPATHY, LEFT 07/22/2008  . MYALGIA 07/07/2009  . Other and unspecified hyperlipidemia 11/22/2006  . Other chronic sinusitis 05/30/2007  . 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 PANIC ATTACK, ACUTE 07/11/2007   Past Surgical History  Procedure Date  . Hernia repair     Inguinal  . Orchiectomy   . Back surgery 2008  . Lumbar laminectomy   . Lumbar fusion   . Ganglion cyst excision     finger    reports that he has never smoked. He does not have any smokeless tobacco history on file. He reports that he does not drink alcohol or use illicit drugs. family history includes Colon cancer in his paternal grandmother; Heart attack in his  other; Heart disease in his mother; and Prostate cancer in his father. Allergies  Allergen Reactions  . Erythromycin Rash       Objective:   Physical Exam  [nursing notereviewed. Constitutional: He is oriented to person, place, and time. He appears well-developed and well-nourished.  HENT:  Head: Normocephalic and atraumatic.  Eyes: Conjunctivae are normal. Pupils are equal, round, and reactive to light.  Neck: Normal range of motion. Neck supple.  Cardiovascular: Normal rate and regular rhythm.   Pulmonary/Chest: Effort normal and breath sounds normal.  Abdominal: Soft. Bowel sounds are normal.  Musculoskeletal: Normal range of motion.  Neurological: He is alert and oriented to person, place, and time.  Skin: Skin is warm.   Scar on right index finger where the ganglion cyst was removed       Assessment & Plan:  Seborrheic keratosis versus actinic keratosis on the nose treated with cryotherapy  Informed consent was obtained in the lesion was treated for 60 seconds of liquid nitrogen application the patient tolerated the procedure well as procedural care was discussed with the patient and instructions should the lesion reappears contact our office immediately Hypogonadism treated with axillary testosterone as he feels that this has given him increased energy and sense of well being testosterone level will be monitored today Neck strain Syndrome... patient is acute neck strain syndrome due to muscle pull.  We discussed using ice and heat and stretching exercises.

## 2010-09-30 NOTE — Assessment & Plan Note (Signed)
monitoring of leves

## 2010-10-22 ENCOUNTER — Other Ambulatory Visit: Payer: Self-pay | Admitting: Internal Medicine

## 2010-11-02 ENCOUNTER — Telehealth: Payer: Self-pay | Admitting: Internal Medicine

## 2010-11-02 NOTE — Telephone Encounter (Signed)
Pt called 7/10. Had testosterone levels checked approx 2 months ago but was never called with the results. Please call the pt and provide this info.

## 2010-11-02 NOTE — Telephone Encounter (Signed)
Level was 181 and he had been using axiron under 1 arm.  Do you have any  Other suggestions?

## 2010-11-03 NOTE — Telephone Encounter (Signed)
Notified pt. 

## 2010-11-03 NOTE — Telephone Encounter (Signed)
Per dr Lovell Sheehan- please use under both arms

## 2010-11-29 ENCOUNTER — Other Ambulatory Visit: Payer: Self-pay | Admitting: *Deleted

## 2010-11-29 MED ORDER — TESTOSTERONE 30 MG/ACT TD SOLN: 1.0000 | Freq: Every day | TRANSDERMAL | Status: DC

## 2010-12-28 ENCOUNTER — Other Ambulatory Visit: Payer: Self-pay | Admitting: Internal Medicine

## 2011-02-02 ENCOUNTER — Ambulatory Visit (INDEPENDENT_AMBULATORY_CARE_PROVIDER_SITE_OTHER): Payer: BC Managed Care – PPO | Admitting: Internal Medicine

## 2011-02-02 ENCOUNTER — Encounter: Payer: Self-pay | Admitting: Internal Medicine

## 2011-02-02 VITALS — BP 130/70 | HR 72 | Temp 98.2°F | Resp 16 | Ht 67.0 in | Wt 186.0 lb

## 2011-02-02 DIAGNOSIS — M5412 Radiculopathy, cervical region: Secondary | ICD-10-CM

## 2011-02-02 DIAGNOSIS — Z23 Encounter for immunization: Secondary | ICD-10-CM

## 2011-02-02 DIAGNOSIS — IMO0002 Reserved for concepts with insufficient information to code with codable children: Secondary | ICD-10-CM

## 2011-02-02 MED ORDER — TRAMADOL HCL 50 MG PO TABS: 50.0000 mg | ORAL_TABLET | Freq: Four times a day (QID) | ORAL | Status: DC | PRN

## 2011-02-02 NOTE — Progress Notes (Signed)
  Subjective:    Patient ID: Jon Richardson, male    DOB: 1960-08-09, 50 y.o.   MRN: [id number]  HPI Has lost weight with weight watchers Neck pain radiating to the left and the right with weakness   Review of Systems  Constitutional: Positive for fatigue. Negative for fever.  HENT: Positive for neck pain. Negative for hearing loss, congestion and postnasal drip.   Eyes: Negative for discharge, redness and visual disturbance.  Respiratory: Negative for cough, shortness of breath and wheezing.   Cardiovascular: Negative for leg swelling.  Gastrointestinal: Negative for abdominal pain, constipation and abdominal distention.  Genitourinary: Negative for urgency and frequency.  Musculoskeletal: Positive for myalgias and back pain. Negative for joint swelling and arthralgias.  Skin: Negative for color change and rash.  Neurological: Negative for weakness and light-headedness.  Hematological: Negative for adenopathy.  Psychiatric/Behavioral: Negative for behavioral problems.       Objective:   Physical Exam  Nursing note and vitals reviewed. Constitutional: He appears well-developed and well-nourished.  Cardiovascular: Normal rate and regular rhythm.   Pulmonary/Chest: Effort normal and breath sounds normal.  Musculoskeletal: He exhibits edema and tenderness.          Assessment & Plan:  Back exercises taught Muscle relaxant and core exercises

## 2011-02-02 NOTE — Patient Instructions (Signed)
Cervical and Neck Sprain and Strain (Neck Sprain and Strain) A cervical sprain is an injury to the neck. The injury can include either over-stretching or even small tears in the ligaments that hold the bones of the neck in place. A strain affects muscles and tendons. Minor injuries usually only involve ligaments and muscles. Because the different parts of the neck are so close together, more severe injuries can involve both sprain and strain. These injuries can affect the muscles, ligaments, tendons, discs, and nerves in the neck. SYMPTOMS  Pain, soreness, stiffness, or burning sensation in the front, back, or sides of the neck. This may develop immediately after injury. Onset of discomfort may also develop slowly and not begin for 24 hours or more.   Shoulder and/or upper back pain.   Limits to the normal movement of the neck.   Headache.   Dizziness.   Weakness and/or abnormal sensation (such as numbness or tingling) of one or both arms and/or hands.   Muscle spasm.   Difficulty with swallowing or chewing.   Tenderness and swelling at the injury site.  CAUSES An injury may be the result of a direct blow or from certain habits that can lead to the symptoms noted above.  Injury from:   Contact sports (such as football, rugby, wrestling, hockey, auto racing, gymnastics, diving, martial arts, and boxing).   Motor vehicle accidents.   Whiplash injuries (see image at right). These are common. They occur when the neck is forcefully whipped or forced backward and/or forward.   Falls.   Lifestyle or awkward postures:   Cradling a telephone between the ear and shoulder.   Sitting in a chair that offers no support.   Working at an ill-designed computer station.   Activities that require hours of repeated or long periods of looking up (stretching the neck backward) or looking down (bending the head/neck forward).  DIAGNOSIS  Most of the time, your caregiver can diagnose this  problem with a careful history and examination. The history will include information about known problems (such as arthritis in the neck) or a previous neck injury. X-rays may be ordered to find out if there is a different problem. X-rays can also help to find problems with the bones of the neck not related to the injury or current symptoms. TREATMENT Several treatment options are available to help pain, spasm, and other symptoms. They include:  Cold helps relieve pain and reduce inflammation. Cold should be applied for 10 to 15 minutes every 2 to 3 hours after any activity that aggravates your symptoms. Use ice packs or an ice massage. Place a towel or cloth in between your skin and the ice pack.   Medication:   Only take over-the-counter or prescription medicines for pain, discomfort, or fever as directed by your caregiver.   Pain relievers or muscle relaxants may be prescribed. Use only as directed and only as much as you need.   Change in the activity that caused the problem. This might include using a headset with a telephone so that the phone is not propped between your ear and shoulder.   Neck collar. Your caregiver may recommend temporary use of a soft cervical collar.   Work station. Changes may be needed in your work place. A better sitting position and/or better posture during work may be part of your treatment.   Physical Therapy. Your caregiver may recommend physical therapy. This can include instructions in the use of stretching and strengthening exercises. Improvement in   posture is important. Exercises and posture training can help stabilize the neck and strengthen muscles and keep symptoms from returning.  HOME CARE INSTRUCTIONS  Other than formal physical therapy, all treatments above can be done at home. Even when not at work, it is important to be conscious of your posture and of activities that can cause a return of symptoms. Most cervical sprains and/or strains are better in  1-3 weeks. As you improve and increase activities, doing a warm up and stretching before the activity will help prevent recurrent problems. SEEK MEDICAL CARE IF:   Pain is not effectively controlled with medication.   You feel unable to decrease pain medication over time as planned.   Activity level is not improving as planned and/or expected.  SEEK IMMEDIATE MEDICAL CARE IF:   While using medication, you develop any bleeding, stomach upset, or signs of an allergic reaction.   Symptoms get worse, become intolerable, and are not helped by medications.   New, unexplained symptoms develop.   You experience numbness, tingling, weakness, or paralysis of any part of your body.  MAKE SURE YOU:   Understand these instructions.   Will watch your condition.   Will get help right away if you are not doing well or get worse.  Document Released: 02/06/2007 Document Re-Released: 07/08/2008 [employer] Patient Information 2011 [employer]. 

## 2011-05-06 ENCOUNTER — Ambulatory Visit (INDEPENDENT_AMBULATORY_CARE_PROVIDER_SITE_OTHER): Payer: BC Managed Care – PPO | Admitting: Internal Medicine

## 2011-05-06 ENCOUNTER — Encounter: Payer: Self-pay | Admitting: Internal Medicine

## 2011-05-06 DIAGNOSIS — D239 Other benign neoplasm of skin, unspecified: Secondary | ICD-10-CM

## 2011-05-06 DIAGNOSIS — D229 Melanocytic nevi, unspecified: Secondary | ICD-10-CM

## 2011-05-06 NOTE — Progress Notes (Signed)
  Subjective:    Patient ID: [name], male    DOB: April 08, 1961, 51 y.o.   MRN: [id number]  HPI    Review of Systems  Unable to perform ROS Constitutional: Negative for fever and fatigue.  HENT: Negative for hearing loss, congestion, neck pain and postnasal drip.   Eyes: Negative for discharge, redness and visual disturbance.  Respiratory: Negative for cough, shortness of breath and wheezing.   Cardiovascular: Negative for leg swelling.  Gastrointestinal: Negative for abdominal pain, constipation and abdominal distention.  Genitourinary: Negative for urgency and frequency.  Musculoskeletal: Negative for joint swelling and arthralgias.  Skin: Negative for color change and rash.  Neurological: Negative for weakness and light-headedness.  Hematological: Negative for adenopathy.  Psychiatric/Behavioral: Negative for behavioral problems.  All other systems reviewed and are negative.       Objective:   Physical Exam Skin examination revealed 2 suspicious moles on the mid back with irrigated color and raised architecture these were biopsied       Assessment & Plan:  Informed consent was given by the patient for a shave biopsy. The site was prepped with Betadine and using a 15 blade a 1 cm shave biopsy was obtained. The specimin was placed in preservative and sent for pathology. Hemostasis was achieved with a compression. Wound care was discussed with the patient. The patient was informed that it would be one to 2 weeks before the pathology will be interpreted. 2 shave biopsies from the mid back

## 2011-05-06 NOTE — Progress Notes (Signed)
Addended by: Willy Eddy on: 05/06/2011 11:43 AM   Modules accepted: Orders

## 2011-05-06 NOTE — Patient Instructions (Signed)
Leave the dressing in place until you've showered. Place a dab of Neosporin and a new bandage until a scab appears over the biopsy site. After the scab appears there is no need to keep a dressing in place unless the area can be irritated by clothing. Call our office if redness around the biopsy site appears and begins to spread more than a half inch from the site.  

## 2011-05-30 ENCOUNTER — Ambulatory Visit: Payer: BC Managed Care – PPO | Admitting: Internal Medicine

## 2011-05-31 ENCOUNTER — Other Ambulatory Visit: Payer: Self-pay | Admitting: Internal Medicine

## 2011-05-31 ENCOUNTER — Ambulatory Visit: Payer: BC Managed Care – PPO | Admitting: Internal Medicine

## 2011-06-02 ENCOUNTER — Other Ambulatory Visit: Payer: Self-pay | Admitting: Internal Medicine

## 2011-06-08 ENCOUNTER — Encounter: Payer: Self-pay | Admitting: Internal Medicine

## 2011-06-09 ENCOUNTER — Ambulatory Visit (INDEPENDENT_AMBULATORY_CARE_PROVIDER_SITE_OTHER): Payer: BC Managed Care – PPO | Admitting: Internal Medicine

## 2011-06-09 ENCOUNTER — Encounter: Payer: Self-pay | Admitting: Internal Medicine

## 2011-06-09 DIAGNOSIS — D229 Melanocytic nevi, unspecified: Secondary | ICD-10-CM

## 2011-06-09 DIAGNOSIS — D239 Other benign neoplasm of skin, unspecified: Secondary | ICD-10-CM

## 2011-06-09 NOTE — Patient Instructions (Signed)
As the sites for recurrence of any pigmented you see any pigment at the 2 sites the far office to schedule reexcision

## 2011-06-09 NOTE — Progress Notes (Signed)
  Subjective:    Patient ID: Jon Richardson, male    DOB: Jun 24, 1960, 51 y.o.   MRN: [id number]  HPI  Patient presents for reexamination of a biopsy site because the margins were involved in a limited fashion  Review of Systems     Objective:   Physical Exam        Assessment & Plan:  Careful examination with the Tenex magnification of both sides shows no recurrence of pigmented lesions.  We instructed the patient on monitoring techniques for monitoring the sites and we will continue to monitor the sites and further office visits but I do not believe that a repeat conservative reexcision is indicated at this time

## 2011-07-19 ENCOUNTER — Encounter: Payer: Self-pay | Admitting: Internal Medicine

## 2011-08-04 ENCOUNTER — Encounter: Payer: Self-pay | Admitting: Internal Medicine

## 2011-08-04 ENCOUNTER — Ambulatory Visit (INDEPENDENT_AMBULATORY_CARE_PROVIDER_SITE_OTHER): Payer: BC Managed Care – PPO | Admitting: Internal Medicine

## 2011-08-04 VITALS — BP 144/72 | HR 64 | Temp 98.2°F | Resp 16 | Ht 67.0 in | Wt 182.0 lb

## 2011-08-04 DIAGNOSIS — M7020 Olecranon bursitis, unspecified elbow: Secondary | ICD-10-CM

## 2011-08-04 DIAGNOSIS — I10 Essential (primary) hypertension: Secondary | ICD-10-CM

## 2011-08-04 DIAGNOSIS — M7030 Other bursitis of elbow, unspecified elbow: Secondary | ICD-10-CM

## 2011-08-04 MED ORDER — CELECOXIB 200 MG PO CAPS: 200.0000 mg | ORAL_CAPSULE | Freq: Two times a day (BID) | ORAL | Status: DC

## 2011-08-04 MED ORDER — BACLOFEN 10 MG PO TABS: 10.0000 mg | ORAL_TABLET | Freq: Two times a day (BID) | ORAL | Status: AC

## 2011-08-04 MED ORDER — CELECOXIB 200 MG PO CAPS: 200.0000 mg | ORAL_CAPSULE | Freq: Two times a day (BID) | ORAL | Status: AC

## 2011-08-04 NOTE — Progress Notes (Signed)
  Subjective:    Patient ID: Jon Richardson, male    DOB: 06-30-1960, 51 y.o.   MRN: [id number]  HPI The patient has bilateral elbow pain on the inside and radiates into the biceps muscle. This is been going on for some time he states that it is worsened by doing repetitive activities such as putting out mulch. There is no focal tenderness in the muscle but there is pain induced by flexing of the forearm. No heat warmth or rashes visible He takes Advil and Tylenol for his back he does not see this affecting he has a history of GERD as a chronic PPI with AcipHex Since he has lost weight he said he used the AcipHex less for reflux    Review of Systems     Objective:   Physical Exam        Assessment & Plan:  There is a diffuse tendinitis of the elbow worse on left than right

## 2011-08-04 NOTE — Patient Instructions (Signed)
The patient is instructed to continue all medications as prescribed. Schedule followup with check out clerk upon leaving the clinic  

## 2011-08-31 ENCOUNTER — Ambulatory Visit (AMBULATORY_SURGERY_CENTER): Payer: BC Managed Care – PPO | Admitting: *Deleted

## 2011-08-31 VITALS — Ht 67.0 in | Wt 183.8 lb

## 2011-08-31 DIAGNOSIS — Z1211 Encounter for screening for malignant neoplasm of colon: Secondary | ICD-10-CM

## 2011-08-31 MED ORDER — PEG-KCL-NACL-NASULF-NA ASC-C 100 G PO SOLR: 1.0000 | Freq: Once | ORAL | Status: DC

## 2011-09-01 ENCOUNTER — Ambulatory Visit (INDEPENDENT_AMBULATORY_CARE_PROVIDER_SITE_OTHER): Payer: BC Managed Care – PPO | Admitting: Family Medicine

## 2011-09-01 ENCOUNTER — Encounter: Payer: Self-pay | Admitting: Family Medicine

## 2011-09-01 VITALS — BP 134/78 | HR 83 | Temp 98.7°F | Wt 182.0 lb

## 2011-09-01 DIAGNOSIS — M7710 Lateral epicondylitis, unspecified elbow: Secondary | ICD-10-CM

## 2011-09-01 DIAGNOSIS — R0789 Other chest pain: Secondary | ICD-10-CM

## 2011-09-01 DIAGNOSIS — R071 Chest pain on breathing: Secondary | ICD-10-CM

## 2011-09-01 NOTE — Progress Notes (Signed)
  Subjective:    Patient ID: Jon Richardson, male    DOB: 04/10/1961, 51 y.o.   MRN: [id number]  HPI Here with several issues. First he has been dealing with pain in the left elbow and forearm for the past 2 months. He has some neck pains as well, but they do not seem to radiate down the arm. No hx of trauma. 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 Also he describes occasional sharp pains in the chest area which last about 1-2 minutes each. These are not associated with exertion, and there is no SOB or nausea or sweats with them. He takes one Celebrex daily and adds Tramadol occasionally for pain.   Review of Systems  Constitutional: Negative.   Respiratory: Negative.   Cardiovascular: Positive for chest pain. Negative for palpitations and leg swelling.  Musculoskeletal: Positive for arthralgias.       Objective:   Physical Exam  Constitutional: He appears well-developed and well-nourished.  Neck: Neck supple. No thyromegaly present.  Cardiovascular: Normal rate, regular rhythm, normal heart sounds and intact distal pulses.  Exam reveals no gallop and no friction rub.   No murmur heard. Pulmonary/Chest: Effort normal and breath sounds normal. No respiratory distress. He has no wheezes. He has no rales.       Mildly tender along the sternal margins  Musculoskeletal:       Very tender over the left lateral epicondyle   Lymphadenopathy:    He has no cervical adenopathy.          Assessment & Plan:  Increase the Celebrex to bid. Use ice on the elbow prn

## 2011-09-05 ENCOUNTER — Other Ambulatory Visit: Payer: Self-pay | Admitting: Internal Medicine

## 2011-09-05 DIAGNOSIS — R002 Palpitations: Secondary | ICD-10-CM

## 2011-09-06 ENCOUNTER — Telehealth: Payer: Self-pay | Admitting: Internal Medicine

## 2011-09-06 NOTE — Telephone Encounter (Signed)
Having "erratic heart beats" and is being put on holter monitor and wanted to know if he should reschedule his colon.  Advised him to be clear by his caridiologist first and then reschedule his colon.

## 2011-09-09 ENCOUNTER — Encounter: Payer: BC Managed Care – PPO | Admitting: Internal Medicine

## 2011-09-20 ENCOUNTER — Telehealth: Payer: Self-pay | Admitting: Speech Pathology

## 2011-09-20 NOTE — Telephone Encounter (Signed)
Windell Moulding from [facility] called and said they do not have a 72 hour Holter Monitor. They have a 24 or 48 hour, only. She said anything over 48 hours is an Event Monitor.  Please advise. Thanks.

## 2011-09-20 NOTE — Telephone Encounter (Signed)
Left message on machine 48 hours holter monitor      Is ok

## 2011-09-21 ENCOUNTER — Telehealth: Payer: Self-pay

## 2011-09-23 ENCOUNTER — Other Ambulatory Visit: Payer: Self-pay | Admitting: *Deleted

## 2011-09-23 ENCOUNTER — Ambulatory Visit (INDEPENDENT_AMBULATORY_CARE_PROVIDER_SITE_OTHER): Payer: BC Managed Care – PPO | Admitting: Internal Medicine

## 2011-09-23 VITALS — BP 138/80 | HR 68 | Temp 98.2°F | Resp 16 | Ht 67.0 in | Wt 178.0 lb

## 2011-09-23 DIAGNOSIS — R002 Palpitations: Secondary | ICD-10-CM

## 2011-09-23 DIAGNOSIS — F411 Generalized anxiety disorder: Secondary | ICD-10-CM

## 2011-09-23 DIAGNOSIS — M542 Cervicalgia: Secondary | ICD-10-CM

## 2011-09-23 DIAGNOSIS — F419 Anxiety disorder, unspecified: Secondary | ICD-10-CM

## 2011-09-23 MED ORDER — TESTOSTERONE 30 MG/ACT TD SOLN: 1.0000 | Freq: Every day | TRANSDERMAL | Status: DC

## 2011-09-23 MED ORDER — TIZANIDINE HCL 2 MG PO CAPS: 2.0000 mg | ORAL_CAPSULE | Freq: Three times a day (TID) | ORAL | Status: AC | PRN

## 2011-09-23 NOTE — Progress Notes (Signed)
Subjective:    Patient ID: Jon Richardson, male    DOB: [birth year], 51 y.o.   MRN: [id number]  HPI  Follow up for palpitations and early awakenings with atypical chest pain Has a hx of cervical spurs and pain radiating to the arms Was diagnoses with tendonitis High anxiety over the chest pain. No diaphoresis, no SOB, no nausea, not exertional. "attack" like resulting in pacing the floor  Review of Systems  Constitutional: Negative for fever and fatigue.  HENT: Negative for hearing loss, congestion, neck pain and postnasal drip.   Eyes: Negative for discharge, redness and visual disturbance.  Respiratory: Negative for cough, shortness of breath and wheezing.   Cardiovascular: Negative for leg swelling.  Gastrointestinal: Negative for abdominal pain, constipation and abdominal distention.  Genitourinary: Negative for urgency and frequency.  Musculoskeletal: Negative for joint swelling and arthralgias.  Skin: Negative for color change and rash.  Neurological: Negative for weakness and light-headedness.  Hematological: Negative for adenopathy.  Psychiatric/Behavioral: Positive for sleep disturbance. Negative for behavioral problems. The patient is nervous/anxious.    Past Medical History  Diagnosis Date  . BENIGN PROSTATIC HYPERTROPHY, WITH URINARY OBSTRUCTION 07/22/2008  . GERD 11/12/2007  . HERPES SIMPLEX, UNCOMPLICATED 11/20/2006  . LUMBAR RADICULOPATHY, LEFT 07/22/2008  . MYALGIA 07/07/2009  . Other and unspecified hyperlipidemia 11/22/2006  . Other chronic sinusitis 05/30/2007  . Low testosterone 11/22/2006  . PANIC ATTACK, ACUTE 07/11/2007  . Cervical pain (neck)     History   Social History  . Marital Status: Single    Spouse Name: N/A    Number of Children: N/A  . Years of Education: N/A   Occupational History  . Not on file.   Social History Main Topics  . Smoking status: Never Smoker   . Smokeless tobacco: Never Used  . Alcohol Use: No  . Drug Use: No  .  Sexually Active: Yes   Other Topics Concern  . Not on file   Social History Narrative  . No narrative on file    Past Surgical History  Procedure Date  . Hernia repair     Inguinal  . Orchiectomy   . Back surgery 2008  . Lumbar laminectomy   . Ganglion cyst excision     finger    Family History  Problem Relation Age of Onset  . Heart disease Mother   . Prostate cancer Father   . Cancer Father     lung cancer  . Lung cancer Father   . Colon cancer Paternal Grandmother   . Heart attack Other   . Esophageal cancer Neg Hx   . Rectal cancer Neg Hx   . Stomach cancer Neg Hx     Allergies  Allergen Reactions  . Erythromycin Rash    Current Outpatient Prescriptions on File Prior to Visit  Medication Sig Dispense Refill  . ALPRAZolam (XANAX) 1 MG tablet TAKE 1/2 TO 1 TABLET BY MOUTH EVERY MORNING AS NEEDED  30 tablet  5  . co-enzyme Q-10 30 MG capsule Take 30 mg by mouth 2 (two) times daily.        . diazepam (VALIUM) 5 MG tablet Take 5 mg by mouth. i 3 time a days as needed for muscle relaxer       . KRILL OIL 1000 MG CAPS Take by mouth 2 (two) times daily.        . RABEprazole (ACIPHEX) 20 MG tablet       . Red Yeast Rice 600 MG CAPS  Take by mouth daily.        . Testosterone 30 MG/ACT SOLN Place 1 applicator onto the skin daily. Apply to each arm daily      . valACYclovir (VALTREX) 500 MG tablet TAKE ONE TABLET BY MOUTH EVERY DAY AS NEEDED  30 tablet  1  . zaleplon (SONATA) 10 MG capsule Take 10 mg by mouth at bedtime.          BP 138/80  Pulse 68  Temp 98.2 F (36.8 C)  Resp 16  Ht 5\' 7"  (1.702 m)  Wt 178 lb (80.74 kg)  BMI 27.88 kg/m2       Objective:   Physical Exam  Nursing note and vitals reviewed. Constitutional: He appears well-developed and well-nourished.  HENT:  Head: Normocephalic and atraumatic.  Eyes: Conjunctivae are normal. Pupils are equal, round, and reactive to light.  Neck: Normal range of motion. Neck supple.  Cardiovascular:  Normal rate and regular rhythm.   Pulmonary/Chest: Effort normal and breath sounds normal.  Abdominal: Soft. Bowel sounds are normal.          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 Reviewed multiple modalities including the use of steroid burst and taper and physical therapy using neck stretching exercises.  Patient's palpitations will be evaluated by a Holter monitor without evidence of significant dysrhythmia.  Discussed the role of anxiety and stress and hyperadrenergic state as a precipitant to many of his symptoms as well as the increased stress in the neck muscles creating increased neck pain. Use of anxiety reduction strategy discussed

## 2011-09-23 NOTE — Patient Instructions (Signed)
Practical paleo 

## 2011-09-28 NOTE — Telephone Encounter (Signed)
CALL PATIENT FOR MONITOR ,HE SAID HE WOULD TALK TO DR Lovell Sheehan AT HIS NEXT APPT ABOUT THIS MONITOR

## 2011-10-20 ENCOUNTER — Encounter: Payer: Self-pay | Admitting: Internal Medicine

## 2011-10-20 ENCOUNTER — Ambulatory Visit (INDEPENDENT_AMBULATORY_CARE_PROVIDER_SITE_OTHER): Payer: BC Managed Care – PPO | Admitting: Internal Medicine

## 2011-10-20 VITALS — BP 132/84 | HR 85 | Temp 98.0°F | Wt 186.0 lb

## 2011-10-20 DIAGNOSIS — M5412 Radiculopathy, cervical region: Secondary | ICD-10-CM

## 2011-10-20 DIAGNOSIS — K219 Gastro-esophageal reflux disease without esophagitis: Secondary | ICD-10-CM

## 2011-10-20 DIAGNOSIS — R079 Chest pain, unspecified: Secondary | ICD-10-CM

## 2011-10-20 NOTE — Progress Notes (Signed)
  Subjective:    Patient ID: Jon Richardson, male    DOB: 1960-07-17, 51 y.o.   MRN: [id number]  HPI  51 year old patient who has a history of cervical spurring and also a history of lumbar radiculopathy. He is seen today complaining of bilateral arm weakness and vague discomfort. This is associated with nonspecific anterior chest pain. He was seen by Dr. Ethelene Hal recently and apparently was referred for further testing he did see a neurologist or possible nerve conduction studies. He is quite anxious about the paroxysmal chest pain. He did see his primary care provider recently an EKG was normal  Review of Systems  Constitutional: Negative for fever, chills, appetite change and fatigue.  HENT: Negative for hearing loss, ear pain, congestion, sore throat, trouble swallowing, neck stiffness, dental problem, voice change and tinnitus.   Eyes: Negative for pain, discharge and visual disturbance.  Respiratory: Negative for cough, chest tightness, wheezing and stridor.   Cardiovascular: Positive for chest pain. Negative for palpitations and leg swelling.  Gastrointestinal: Negative for nausea, vomiting, abdominal pain, diarrhea, constipation, blood in stool and abdominal distention.  Genitourinary: Negative for urgency, hematuria, flank pain, discharge, difficulty urinating and genital sores.  Musculoskeletal: Negative for myalgias, back pain, joint swelling, arthralgias and gait problem.  Skin: Negative for rash.  Neurological: Positive for numbness. Negative for dizziness, syncope, speech difficulty, weakness and headaches.  Hematological: Negative for adenopathy. Does not bruise/bleed easily.  Psychiatric/Behavioral: Negative for behavioral problems and dysphoric mood. The patient is not nervous/anxious.        Objective:   Physical Exam  Constitutional: He is oriented to person, place, and time. He appears well-developed and well-nourished. No distress.       Anxious but no acute distress.  Blood pressure normal  HENT:  Head: Normocephalic.  Right Ear: External ear normal.  Left Ear: External ear normal.  Eyes: Conjunctivae and EOM are normal.  Neck: Normal range of motion.  Cardiovascular: Normal rate, regular rhythm and normal heart sounds.   Pulmonary/Chest: Effort normal and breath sounds normal.  Abdominal: Soft. Bowel sounds are normal.  Musculoskeletal: Normal range of motion. He exhibits no edema and no tenderness.  Neurological: He is alert and oriented to person, place, and time.       Biceps and triceps reflexes were brisk and equal Grip strength normal Flexion and extension of the arm  normal   Psychiatric: He has a normal mood and affect. His behavior is normal.          Assessment & Plan:   Nonspecific chest and arm discomfort. We'll proceed with evaluation as already scheduled. Patient was reassured. Certainly, nothing to suggest ischemic chest pain.

## 2011-10-20 NOTE — Patient Instructions (Signed)
Evaluation as already scheduled  Call or return to clinic prn if these symptoms worsen or fail to improve as anticipated.

## 2011-10-25 ENCOUNTER — Other Ambulatory Visit: Payer: Self-pay | Admitting: Internal Medicine

## 2011-11-15 ENCOUNTER — Ambulatory Visit (INDEPENDENT_AMBULATORY_CARE_PROVIDER_SITE_OTHER): Payer: BC Managed Care – PPO | Admitting: Internal Medicine

## 2011-11-15 VITALS — BP 110/70 | HR 72 | Temp 98.2°F | Resp 16 | Ht 67.0 in | Wt 183.0 lb

## 2011-11-15 DIAGNOSIS — C4441 Basal cell carcinoma of skin of scalp and neck: Secondary | ICD-10-CM

## 2011-11-15 NOTE — Progress Notes (Signed)
  Subjective:    Patient ID: Jon Richardson, male    DOB: 08-Sep-1960, 51 y.o.   MRN: [id number]  HPI    Review of Systems  Constitutional: Negative for fever and fatigue.  HENT: Negative for hearing loss, congestion, neck pain and postnasal drip.   Eyes: Negative for discharge, redness and visual disturbance.  Respiratory: Negative for cough, shortness of breath and wheezing.   Cardiovascular: Negative for leg swelling.  Gastrointestinal: Negative for abdominal pain, constipation and abdominal distention.  Genitourinary: Negative for urgency and frequency.  Musculoskeletal: Negative for joint swelling and arthralgias.  Skin: Negative for color change and rash.  Neurological: Negative for weakness and light-headedness.  Hematological: Negative for adenopathy.  Psychiatric/Behavioral: Negative for behavioral problems.       Objective:   Physical Exam  Lesion on the mid scalp      Assessment & Plan:  Patient presented with a lesion on his upper for had that presented as a nonhealing ulceration that would scab over then bleed and scab over and then bleed.  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

## 2011-12-12 ENCOUNTER — Other Ambulatory Visit: Payer: Self-pay | Admitting: Internal Medicine

## 2012-01-18 ENCOUNTER — Encounter: Payer: Self-pay | Admitting: Internal Medicine

## 2012-01-27 ENCOUNTER — Other Ambulatory Visit: Payer: BC Managed Care – PPO

## 2012-01-27 ENCOUNTER — Other Ambulatory Visit (INDEPENDENT_AMBULATORY_CARE_PROVIDER_SITE_OTHER): Payer: BC Managed Care – PPO

## 2012-01-27 DIAGNOSIS — Z0000 Encounter for general adult medical examination without abnormal findings: Secondary | ICD-10-CM

## 2012-01-27 LAB — TESTOSTERONE: Testosterone: 164.95 ng/dL — ABNORMAL LOW (ref 350.00–890.00)

## 2012-01-27 LAB — HEPATIC FUNCTION PANEL
ALT: 25 U/L (ref 0–53)
AST: 21 U/L (ref 0–37)
Alkaline Phosphatase: 41 U/L (ref 39–117)
Bilirubin, Direct: 0.1 mg/dL (ref 0.0–0.3)
Total Bilirubin: 0.8 mg/dL (ref 0.3–1.2)

## 2012-01-27 LAB — BASIC METABOLIC PANEL
CO2: 28 mEq/L (ref 19–32)
GFR: 74.87 mL/min (ref >60.00)
Glucose, Bld: 105 mg/dL — ABNORMAL HIGH (ref 70–99)
Potassium: 4.6 mEq/L (ref 3.5–5.1)
Sodium: 137 mEq/L (ref 135–145)

## 2012-01-27 LAB — CBC WITH DIFFERENTIAL/PLATELET
Basophils Absolute: 0.1 10*3/uL (ref 0.0–0.1)
Eosinophils Relative: 2.1 % (ref 0.0–5.0)
HCT: 42 % (ref 39.0–52.0)
Hemoglobin: 14.1 g/dL (ref 13.0–17.0)
Lymphocytes Relative: 43.5 % (ref 12.0–46.0)
Lymphs Abs: 2.4 10*3/uL (ref 0.7–4.0)
Monocytes Relative: 8.5 % (ref 3.0–12.0)
Neutro Abs: 2.4 10*3/uL (ref 1.4–7.7)
RBC: 4.58 Mil/uL (ref 4.22–5.81)
RDW: 13.4 % (ref 11.5–14.6)
WBC: 5.4 10*3/uL (ref 4.5–10.5)

## 2012-01-27 LAB — LIPID PANEL
Cholesterol: 240 mg/dL — ABNORMAL HIGH (ref 0–200)
Total CHOL/HDL Ratio: 6
VLDL: 64.2 mg/dL — ABNORMAL HIGH (ref 0.0–40.0)

## 2012-01-27 LAB — LDL CHOLESTEROL, DIRECT: Direct LDL: 126.6 mg/dL

## 2012-02-03 ENCOUNTER — Encounter: Payer: Self-pay | Admitting: Internal Medicine

## 2012-02-03 ENCOUNTER — Ambulatory Visit (INDEPENDENT_AMBULATORY_CARE_PROVIDER_SITE_OTHER): Payer: BC Managed Care – PPO | Admitting: Internal Medicine

## 2012-02-03 VITALS — BP 144/96 | HR 76 | Temp 98.3°F | Resp 16 | Ht 67.0 in | Wt 186.0 lb

## 2012-02-03 DIAGNOSIS — Z23 Encounter for immunization: Secondary | ICD-10-CM

## 2012-02-03 DIAGNOSIS — M62830 Muscle spasm of back: Secondary | ICD-10-CM

## 2012-02-03 DIAGNOSIS — M5380 Other specified dorsopathies, site unspecified: Secondary | ICD-10-CM

## 2012-02-03 MED ORDER — BACLOFEN 20 MG PO TABS: 20.0000 mg | ORAL_TABLET | Freq: Three times a day (TID) | ORAL | Status: DC

## 2012-02-03 NOTE — Patient Instructions (Signed)
Deep tissue

## 2012-02-03 NOTE — Progress Notes (Signed)
Subjective:    Patient ID: Jon Richardson, male    DOB: Apr 25, 1961, 51 y.o.   MRN: [id number]  HPI cpx Chronic neck and upper back pain with x-ray evidence of bone spurs   Review of Systems  Constitutional: Negative for fever and fatigue.  HENT: Negative for hearing loss, congestion, neck pain and postnasal drip.   Eyes: Negative for discharge, redness and visual disturbance.  Respiratory: Negative for cough, shortness of breath and wheezing.   Cardiovascular: Negative for leg swelling.  Gastrointestinal: Negative for abdominal pain, constipation and abdominal distention.  Genitourinary: Negative for urgency and frequency.  Musculoskeletal: Positive for myalgias, back pain, joint swelling and arthralgias.  Skin: Negative for color change and rash.  Neurological: Negative for weakness and light-headedness.  Hematological: Negative for adenopathy.  Psychiatric/Behavioral: Negative for behavioral problems.   Past Medical History  Diagnosis Date  . BENIGN PROSTATIC HYPERTROPHY, WITH URINARY OBSTRUCTION 07/22/2008  . GERD 11/12/2007  . HERPES SIMPLEX, UNCOMPLICATED 11/20/2006  . LUMBAR RADICULOPATHY, LEFT 07/22/2008  . MYALGIA 07/07/2009  . Other and unspecified hyperlipidemia 11/22/2006  . Other chronic sinusitis 05/30/2007  . 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 PANIC ATTACK, ACUTE 07/11/2007  . Cervical pain (neck)     History   Social History  . Marital Status: Single    Spouse Name: N/A    Number of Children: N/A  . Years of Education: N/A   Occupational History  . Not on file.   Social History Main Topics  . Smoking status: Never Smoker   . Smokeless tobacco: Never Used  . Alcohol Use: No  . Drug Use: No  . Sexually Active: Yes   Other Topics Concern  . Not on file   Social History Narrative  . No narrative on file    Past Surgical History  Procedure Date  . Hernia repair     Inguinal  . Orchiectomy   . Back surgery 2008  . Lumbar laminectomy   .  Ganglion cyst excision     finger    Family History  Problem Relation Age of Onset  . Heart disease Mother   . Prostate cancer Father   . Cancer Father     lung cancer  . Lung cancer Father   . Colon cancer Paternal Grandmother   . Heart attack Other   . Esophageal cancer Neg Hx   . Rectal cancer Neg Hx   . Stomach cancer Neg Hx     Allergies  Allergen Reactions  . Erythromycin Rash    Current Outpatient Prescriptions on File Prior to Visit  Medication Sig Dispense Refill  . ACIPHEX 20 MG tablet TAKE ONE TABLET BY MOUTH TWO TIMES A DAY  60 tablet  4  . ALPRAZolam (XANAX) 1 MG tablet TAKE 1/2 TO 1 TABLET BY MOUTH EVERY MORNING AS NEEDED.  30 tablet  5  . co-enzyme Q-10 30 MG capsule Take 30 mg by mouth 2 (two) times daily.        . diazepam (VALIUM) 5 MG tablet Take 5 mg by mouth. i 3 time a days as needed for muscle relaxer       . KRILL OIL 1000 MG CAPS Take by mouth 2 (two) times daily.        . RABEprazole (ACIPHEX) 20 MG tablet       . Red Yeast Rice 600 MG CAPS Take by mouth daily.        . Testosterone 30 MG/ACT SOLN Place 1 applicator onto  the skin daily. Apply to each arm daily  90 mL  3  . tizanidine (ZANAFLEX) 2 MG capsule Take 1 capsule (2 mg total) by mouth 3 (three) times daily as needed for muscle spasms.  90 capsule  3  . valACYclovir (VALTREX) 500 MG tablet TAKE ONE TABLET BY MOUTH EVERY DAY AS NEEDED  30 tablet  1  . zaleplon (SONATA) 10 MG capsule Take 10 mg by mouth at bedtime.          BP 144/96  Pulse 76  Temp 98.3 F (36.8 C)  Resp 16  Ht 5\' 7"  (1.702 m)  Wt 186 lb (84.369 kg)  BMI 29.13 kg/m2       Objective:   Physical Exam  Nursing note and vitals reviewed. Constitutional: He is oriented to person, place, and time. He appears well-developed and well-nourished.  HENT:  Head: Normocephalic and atraumatic.  Eyes: Conjunctivae normal are normal. Pupils are equal, round, and reactive to light.  Neck: Normal range of motion. Neck supple.    Cardiovascular: Normal rate and regular rhythm.   Pulmonary/Chest: Effort normal and breath sounds normal.  Abdominal: Soft. Bowel sounds are normal.  Genitourinary: Rectum normal and prostate normal.  Musculoskeletal:       Upper back spasms for alignment  Neurological: He is alert and oriented to person, place, and time.  Skin: Skin is warm and dry.          Assessment & Plan:   Patient presents for yearly preventative medicine examination.   all immunizations and health maintenance protocols were reviewed with the patient and they are up to date with these protocols.   screening laboratory values were reviewed with the patient including screening of hyperlipidemia PSA renal function and hepatic function.   There medications past medical history social history problem list and allergies were reviewed in detail.   Goals were established with regard to weight loss exercise diet in compliance with medications

## 2012-02-15 ENCOUNTER — Other Ambulatory Visit: Payer: Self-pay | Admitting: *Deleted

## 2012-02-15 MED ORDER — AZITHROMYCIN 250 MG PO TABS: ORAL_TABLET | ORAL | Status: DC

## 2012-05-04 ENCOUNTER — Encounter: Payer: Self-pay | Admitting: Internal Medicine

## 2012-05-04 ENCOUNTER — Ambulatory Visit (INDEPENDENT_AMBULATORY_CARE_PROVIDER_SITE_OTHER): Payer: BC Managed Care – PPO | Admitting: Internal Medicine

## 2012-05-04 VITALS — BP 136/90 | HR 76 | Temp 98.1°F | Resp 16 | Ht 67.0 in | Wt 190.0 lb

## 2012-05-04 DIAGNOSIS — T887XXA Unspecified adverse effect of drug or medicament, initial encounter: Secondary | ICD-10-CM

## 2012-05-04 DIAGNOSIS — IMO0002 Reserved for concepts with insufficient information to code with codable children: Secondary | ICD-10-CM

## 2012-05-04 DIAGNOSIS — M5410 Radiculopathy, site unspecified: Secondary | ICD-10-CM

## 2012-05-04 DIAGNOSIS — E291 Testicular hypofunction: Secondary | ICD-10-CM

## 2012-05-04 LAB — BASIC METABOLIC PANEL
Calcium: 9.7 mg/dL (ref 8.4–10.5)
Chloride: 102 mEq/L (ref 96–112)
Creatinine, Ser: 1.2 mg/dL (ref 0.4–1.5)
Sodium: 137 mEq/L (ref 135–145)

## 2012-05-04 MED ORDER — TESTOSTERONE 30 MG/ACT TD SOLN: 1.0000 | Freq: Every day | TRANSDERMAL | Status: DC

## 2012-05-04 NOTE — Progress Notes (Signed)
Subjective:    Patient ID: B Lane Hacker, male    DOB: 1960-08-09, 52 y.o.   MRN: [id number]  HPI Hx of laminectomy at 4-5 and has disease at the upper level 3-4 Has neck pain due to spurs Pain does go down right and left leg while driving    Review of Systems  Constitutional: Negative for fever and fatigue.  HENT: Negative for hearing loss, congestion, neck pain and postnasal drip.   Eyes: Negative for discharge, redness and visual disturbance.  Respiratory: Negative for cough, shortness of breath and wheezing.   Cardiovascular: Negative for leg swelling.  Gastrointestinal: Negative for abdominal pain, constipation and abdominal distention.  Genitourinary: Negative for urgency and frequency.  Musculoskeletal: Positive for myalgias, back pain and gait problem. Negative for joint swelling and arthralgias.  Skin: Negative for color change and rash.  Neurological: Negative for weakness and light-headedness.  Hematological: Negative for adenopathy.  Psychiatric/Behavioral: Positive for dysphoric mood. Negative for behavioral problems.   Past Medical History  Diagnosis Date  . BENIGN PROSTATIC HYPERTROPHY, WITH URINARY OBSTRUCTION 07/22/2008  . GERD 11/12/2007  . HERPES SIMPLEX, UNCOMPLICATED 11/20/2006  . LUMBAR RADICULOPATHY, LEFT 07/22/2008  . MYALGIA 07/07/2009  . Other and unspecified hyperlipidemia 11/22/2006  . Other chronic sinusitis 05/30/2007  . 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 PANIC ATTACK, ACUTE 07/11/2007  . Cervical pain (neck)     History   Social History  . Marital Status: Single    Spouse Name: N/A    Number of Children: N/A  . Years of Education: N/A   Occupational History  . Not on file.   Social History Main Topics  . Smoking status: Never Smoker   . Smokeless tobacco: Never Used  . Alcohol Use: No  . Drug Use: No  . Sexually Active: Yes   Other Topics Concern  . Not on file   Social History Narrative  . No narrative on file    Past  Surgical History  Procedure Date  . Hernia repair     Inguinal  . Orchiectomy   . Back surgery 2008  . Lumbar laminectomy   . Ganglion cyst excision     finger    Family History  Problem Relation Age of Onset  . Heart disease Mother   . Prostate cancer Father   . Cancer Father     lung cancer  . Lung cancer Father   . Colon cancer Paternal Grandmother   . Heart attack Other   . Esophageal cancer Neg Hx   . Rectal cancer Neg Hx   . Stomach cancer Neg Hx     Allergies  Allergen Reactions  . Erythromycin Rash    Current Outpatient Prescriptions on File Prior to Visit  Medication Sig Dispense Refill  . ACIPHEX 20 MG tablet TAKE ONE TABLET BY MOUTH TWO TIMES A DAY  60 tablet  4  . ALPRAZolam (XANAX) 1 MG tablet TAKE 1/2 TO 1 TABLET BY MOUTH EVERY MORNING AS NEEDED.  30 tablet  5  . baclofen (LIORESAL) 20 MG tablet Take 1 tablet (20 mg total) by mouth 3 (three) times daily.  30 each  0  . co-enzyme Q-10 30 MG capsule Take 30 mg by mouth 2 (two) times daily.        . diazepam (VALIUM) 5 MG tablet Take 5 mg by mouth. i 3 time a days as needed for muscle relaxer       . KRILL OIL 1000 MG CAPS Take by mouth  2 (two) times daily.        . RABEprazole (ACIPHEX) 20 MG tablet       . Red Yeast Rice 600 MG CAPS Take by mouth daily.        . Testosterone 30 MG/ACT SOLN Place 1 applicator onto the skin daily. Apply to each arm daily  90 mL  3  . tizanidine (ZANAFLEX) 2 MG capsule Take 1 capsule (2 mg total) by mouth 3 (three) times daily as needed for muscle spasms.  90 capsule  3  . valACYclovir (VALTREX) 500 MG tablet TAKE ONE TABLET BY MOUTH EVERY DAY AS NEEDED  30 tablet  1  . zaleplon (SONATA) 10 MG capsule Take 10 mg by mouth at bedtime.          BP 136/90  Pulse 76  Temp 98.1 F (36.7 C)  Resp 16  Ht 5\' 7"  (1.702 m)  Wt 190 lb (86.183 kg)  BMI 29.76 kg/m2        Objective:   Physical Exam  Nursing note reviewed. Constitutional: He appears well-developed and  well-nourished.  HENT:  Head: Normocephalic and atraumatic.  Eyes: Conjunctivae normal are normal. Pupils are equal, round, and reactive to light.  Neck: Normal range of motion. Neck supple.  Cardiovascular: Normal rate and regular rhythm.   Pulmonary/Chest: Effort normal and breath sounds normal.  Abdominal: Soft. Bowel sounds are normal.  Musculoskeletal: He exhibits edema and tenderness.   No weakness       Assessment & Plan:  Refer for consideration for epidural therapy at the L3-4 level Dr Ethelene Hal referred for ENG and NCV Refer to interventional [facility]ology at [facility] Refill of testosterone

## 2012-05-04 NOTE — Patient Instructions (Addendum)
The patient is instructed to continue all medications as prescribed. Schedule followup with check out clerk upon leaving the clinic  

## 2012-05-22 ENCOUNTER — Other Ambulatory Visit: Payer: Self-pay | Admitting: Internal Medicine

## 2012-07-01 ENCOUNTER — Other Ambulatory Visit: Payer: BC Managed Care – PPO

## 2012-07-02 ENCOUNTER — Telehealth: Payer: Self-pay | Admitting: Internal Medicine

## 2012-07-02 NOTE — Telephone Encounter (Signed)
Patient having a hard time w/[facility]. He will not go there for his spine MRI. States MRI is supposed to be w/contrast and that they are refusing. Please call pt ASAP to discuss. Thank you.

## 2012-07-02 NOTE — Telephone Encounter (Signed)
Just spoke to [facility] pt is scheduled there for 07/05/12 but refuses to have the MRI done without contrast stated he will only get it done if it is with and without. Please advise

## 2012-07-03 ENCOUNTER — Other Ambulatory Visit: Payer: Self-pay | Admitting: *Deleted

## 2012-07-03 DIAGNOSIS — M5410 Radiculopathy, site unspecified: Secondary | ICD-10-CM

## 2012-07-03 NOTE — Telephone Encounter (Signed)
Pt following up and would like a call from you. MRI scheduled for Thurs.

## 2012-07-03 NOTE — Telephone Encounter (Signed)
He has had surgery on his back therefore he needs with and without-referral sent in

## 2012-07-05 ENCOUNTER — Other Ambulatory Visit: Payer: BC Managed Care – PPO

## 2012-07-05 ENCOUNTER — Ambulatory Visit
Admission: RE | Admit: 2012-07-05 | Discharge: 2012-07-05 | Disposition: A | Discharge status: Home / Self Care | Payer: BC Managed Care – PPO | Source: Ambulatory Visit | Attending: Internal Medicine | Admitting: Internal Medicine

## 2012-07-05 DIAGNOSIS — M5410 Radiculopathy, site unspecified: Secondary | ICD-10-CM

## 2012-07-05 MED ORDER — GADOBENATE DIMEGLUMINE 529 MG/ML IV SOLN
16.0000 mL | Freq: Once | INTRAVENOUS | Status: AC | PRN
  Administered 2012-07-05: 16 mL via INTRAVENOUS

## 2012-07-13 ENCOUNTER — Telehealth: Payer: Self-pay | Admitting: Internal Medicine

## 2012-07-13 DIAGNOSIS — M5416 Radiculopathy, lumbar region: Secondary | ICD-10-CM

## 2012-07-13 DIAGNOSIS — M5136 Other intervertebral disc degeneration, lumbar region: Secondary | ICD-10-CM

## 2012-07-13 NOTE — Telephone Encounter (Signed)
Patient called stating that he would like a call back with MRI test results. Please assist.

## 2012-07-18 ENCOUNTER — Encounter: Payer: Self-pay | Admitting: Internal Medicine

## 2012-08-10 ENCOUNTER — Telehealth: Payer: Self-pay | Admitting: Internal Medicine

## 2012-08-10 NOTE — Telephone Encounter (Signed)
Rec'd from [facility] forward 5 pages to Dr. Lovell Sheehan 08/10/12

## 2012-08-13 ENCOUNTER — Other Ambulatory Visit: Payer: Self-pay | Admitting: Neurosurgery

## 2012-08-13 DIAGNOSIS — M5412 Radiculopathy, cervical region: Secondary | ICD-10-CM

## 2012-08-17 ENCOUNTER — Ambulatory Visit
Admission: RE | Admit: 2012-08-17 | Discharge: 2012-08-17 | Disposition: A | Discharge status: Home / Self Care | Payer: BC Managed Care – PPO | Source: Ambulatory Visit | Attending: Neurosurgery | Admitting: Neurosurgery

## 2012-08-17 DIAGNOSIS — M5412 Radiculopathy, cervical region: Secondary | ICD-10-CM

## 2012-08-29 ENCOUNTER — Ambulatory Visit (INDEPENDENT_AMBULATORY_CARE_PROVIDER_SITE_OTHER): Payer: BC Managed Care – PPO | Admitting: Internal Medicine

## 2012-08-29 ENCOUNTER — Encounter: Payer: Self-pay | Admitting: Internal Medicine

## 2012-08-29 VITALS — BP 112/78 | HR 60 | Temp 98.0°F | Resp 16 | Ht 67.0 in | Wt 194.0 lb

## 2012-08-29 DIAGNOSIS — M549 Dorsalgia, unspecified: Secondary | ICD-10-CM

## 2012-08-29 DIAGNOSIS — M5010 Cervical disc disorder with radiculopathy, unspecified cervical region: Secondary | ICD-10-CM | POA: Insufficient documentation

## 2012-08-29 DIAGNOSIS — G5630 Lesion of radial nerve, unspecified upper limb: Secondary | ICD-10-CM

## 2012-08-29 DIAGNOSIS — M5412 Radiculopathy, cervical region: Secondary | ICD-10-CM

## 2012-08-29 DIAGNOSIS — G5631 Lesion of radial nerve, right upper limb: Secondary | ICD-10-CM

## 2012-08-29 NOTE — Progress Notes (Signed)
Subjective:    Patient ID: Jon Richardson, male    DOB: 1960-08-22, 52 y.o.   MRN: [id number]  HPI Patient has neck and back pain and has been diagnosed with radial nerve entrapment symdrome Has follow up with neursurgeon. Stable blood pressure Weight increased    Review of Systems  Constitutional: Negative for fever and fatigue.  HENT: Negative for hearing loss, congestion, neck pain and postnasal drip.   Eyes: Negative for discharge, redness and visual disturbance.  Respiratory: Negative for cough, shortness of breath and wheezing.   Cardiovascular: Negative for leg swelling.  Gastrointestinal: Negative for abdominal pain, constipation and abdominal distention.  Genitourinary: Negative for urgency and frequency.  Musculoskeletal: Positive for myalgias, back pain and arthralgias. Negative for joint swelling.  Skin: Negative for color change and rash.  Neurological: Negative for weakness and light-headedness.  Hematological: Negative for adenopathy.  Psychiatric/Behavioral: Negative for behavioral problems.   Past Medical History  Diagnosis Date  . BENIGN PROSTATIC HYPERTROPHY, WITH URINARY OBSTRUCTION 07/22/2008  . GERD 11/12/2007  . HERPES SIMPLEX, UNCOMPLICATED 11/20/2006  . LUMBAR RADICULOPATHY, LEFT 07/22/2008  . MYALGIA 07/07/2009  . Other and unspecified hyperlipidemia 11/22/2006  . Other chronic sinusitis 05/30/2007  . 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 PANIC ATTACK, ACUTE 07/11/2007  . Cervical pain (neck)     History   Social History  . Marital Status: Single    Spouse Name: N/A    Number of Children: N/A  . Years of Education: N/A   Occupational History  . Not on file.   Social History Main Topics  . Smoking status: Never Smoker   . Smokeless tobacco: Never Used  . Alcohol Use: No  . Drug Use: No  . Sexually Active: Yes   Other Topics Concern  . Not on file   Social History Narrative  . No narrative on file    Past Surgical History   Procedure Laterality Date  . Hernia repair      Inguinal  . Orchiectomy    . Back surgery  2008  . Lumbar laminectomy    . Ganglion cyst excision      finger    Family History  Problem Relation Age of Onset  . Heart disease Mother   . Prostate cancer Father   . Cancer Father     lung cancer  . Lung cancer Father   . Colon cancer Paternal Grandmother   . Heart attack Other   . Esophageal cancer Neg Hx   . Rectal cancer Neg Hx   . Stomach cancer Neg Hx     Allergies  Allergen Reactions  . Erythromycin Rash    Current Outpatient Prescriptions on File Prior to Visit  Medication Sig Dispense Refill  . ACIPHEX 20 MG tablet TAKE ONE TABLET BY MOUTH TWO TIMES A DAY  60 tablet  4  . ALPRAZolam (XANAX) 1 MG tablet TAKE 1/2 TO 1 TABLET BY MOUTH EVERY MORNING AS NEEDED.  30 tablet  5  . baclofen (LIORESAL) 20 MG tablet Take 1 tablet (20 mg total) by mouth 3 (three) times daily.  30 each  0  . co-enzyme Q-10 30 MG capsule Take 30 mg by mouth 2 (two) times daily.        . diazepam (VALIUM) 5 MG tablet Take 5 mg by mouth. i 3 time a days as needed for muscle relaxer       . KRILL OIL 1000 MG CAPS Take by mouth 2 (two) times daily.        Marland Kitchen  RABEprazole (ACIPHEX) 20 MG tablet       . Red Yeast Rice 600 MG CAPS Take by mouth daily.        . Testosterone 30 MG/ACT SOLN Place 1 applicator onto the skin daily. Apply to each arm daily  90 mL  3  . tizanidine (ZANAFLEX) 2 MG capsule Take 1 capsule (2 mg total) by mouth 3 (three) times daily as needed for muscle spasms.  90 capsule  3  . zaleplon (SONATA) 10 MG capsule Take 10 mg by mouth at bedtime.        . valACYclovir (VALTREX) 500 MG tablet TAKE ONE TABLET BY MOUTH EVERY DAY AS NEEDED  30 tablet  1   No current facility-administered medications on file prior to visit.    BP 112/78  Pulse 60  Temp(Src) 98 F (36.7 C)  Resp 16  Ht 5\' 7"  (1.702 m)  Wt 194 lb (87.998 kg)  BMI 30.38 kg/m2       Objective:   Physical Exam   Nursing note and vitals reviewed. Constitutional: He appears well-developed and well-nourished.  HENT:  Head: Normocephalic and atraumatic.  Cardiovascular: Normal rate and regular rhythm.   Pulmonary/Chest: Effort normal and breath sounds normal.  Abdominal: Soft. Bowel sounds are normal.  Skin: Skin is warm and dry.          Assessment & Plan:  Gluten free diet to help arthritis and inflammation Discussion  About the radial nerve diagnosis  I have spent more than 30 minutes examining this patient face-to-face of which over half was spent in counseling  Anxiety and insomnia secondary to stress

## 2012-08-29 NOTE — Patient Instructions (Signed)
The patient is instructed to continue all medications as prescribed. Schedule followup with check out clerk upon leaving the clinic  

## 2012-09-18 ENCOUNTER — Other Ambulatory Visit: Payer: Self-pay | Admitting: Internal Medicine

## 2012-12-12 ENCOUNTER — Ambulatory Visit (AMBULATORY_SURGERY_CENTER): Payer: BC Managed Care – PPO | Admitting: *Deleted

## 2012-12-12 ENCOUNTER — Encounter: Payer: Self-pay | Admitting: Internal Medicine

## 2012-12-12 VITALS — Ht 67.0 in | Wt 189.2 lb

## 2012-12-12 DIAGNOSIS — Z1211 Encounter for screening for malignant neoplasm of colon: Secondary | ICD-10-CM

## 2012-12-12 MED ORDER — MOVIPREP 100 G PO SOLR: 1.0000 | Freq: Once | ORAL | Status: DC

## 2012-12-12 NOTE — Progress Notes (Signed)
No egg or soy allergy. ewm Last colon in 2003 with dr Juanda Chance normal. ewm No cpap or home 02 use. ewm Heart palpitations with novacaine but no other problems with past sedation. ewm Pt states he has a movi prep at home that has not expired so will not re prescribe that prep. ewm

## 2012-12-26 ENCOUNTER — Encounter: Payer: BC Managed Care – PPO | Admitting: Internal Medicine

## 2013-01-11 ENCOUNTER — Ambulatory Visit (AMBULATORY_SURGERY_CENTER): Payer: BC Managed Care – PPO | Admitting: Internal Medicine

## 2013-01-11 ENCOUNTER — Encounter: Payer: Self-pay | Admitting: *Deleted

## 2013-01-11 ENCOUNTER — Encounter: Payer: Self-pay | Admitting: Internal Medicine

## 2013-01-11 ENCOUNTER — Telehealth: Payer: Self-pay | Admitting: Gastroenterology

## 2013-01-11 VITALS — BP 136/82 | HR 69 | Temp 96.6°F | Resp 16 | Ht 67.0 in | Wt 189.0 lb

## 2013-01-11 DIAGNOSIS — R197 Diarrhea, unspecified: Secondary | ICD-10-CM

## 2013-01-11 DIAGNOSIS — Z1211 Encounter for screening for malignant neoplasm of colon: Secondary | ICD-10-CM

## 2013-01-11 MED ORDER — DICYCLOMINE HCL 20 MG PO TABS: 20.0000 mg | ORAL_TABLET | Freq: Every day | ORAL | Status: DC

## 2013-01-11 MED ORDER — DICYCLOMINE HCL 10 MG PO CAPS: 20.0000 mg | ORAL_CAPSULE | Freq: Every day | ORAL | Status: DC

## 2013-01-11 MED ORDER — SODIUM CHLORIDE 0.9 % IV SOLN: 500.0000 mL | INTRAVENOUS | Status: DC

## 2013-01-11 NOTE — Telephone Encounter (Signed)
Her script was called into the wrong pharmacy today.  i corrected it

## 2013-01-11 NOTE — Patient Instructions (Addendum)
Discharge instructions given with verbal understanding. Handouts on diverticulosis and a high fiber diet given. Resume previous medications.YOU HAD AN ENDOSCOPIC PROCEDURE TODAY AT THE [facility]: Refer to the procedure report that was given to you for any specific questions about what was found during the examination.  If the procedure report does not answer your questions, please call your gastroenterologist to clarify.  If you requested that your care partner not be given the details of your procedure findings, then the procedure report has been included in a sealed envelope for you to review at your convenience later.  YOU SHOULD EXPECT: Some feelings of bloating in the abdomen. Passage of more gas than usual.  Walking can help get rid of the air that was put into your GI tract during the procedure and reduce the bloating. If you had a lower endoscopy (such as a colonoscopy or flexible sigmoidoscopy) you may notice spotting of blood in your stool or on the toilet paper. If you underwent a bowel prep for your procedure, then you may not have a normal bowel movement for a few days.  DIET: Your first meal following the procedure should be a light meal and then it is ok to progress to your normal diet.  A half-sandwich or bowl of soup is an example of a good first meal.  Heavy or fried foods are harder to digest and may make you feel nauseous or bloated.  Likewise meals heavy in dairy and vegetables can cause extra gas to form and this can also increase the bloating.  Drink plenty of fluids but you should avoid alcoholic beverages for 24 hours.  ACTIVITY: Your care partner should take you home directly after the procedure.  You should plan to take it easy, moving slowly for the rest of the day.  You can resume normal activity the day after the procedure however you should NOT DRIVE or use heavy machinery for 24 hours (because of the sedation medicines used during the test).    SYMPTOMS TO  REPORT IMMEDIATELY: A gastroenterologist can be reached at any hour.  During normal business hours, 8:30 AM to 5:00 PM Monday through Friday, call [phone].  After hours and on weekends, please call the GI answering service at [phone] who will take a message and have the physician on call contact you.   Following lower endoscopy (colonoscopy or flexible sigmoidoscopy):  Excessive amounts of blood in the stool  Significant tenderness or worsening of abdominal pains  Swelling of the abdomen that is new, acute  Fever of 100F or higher  FOLLOW UP: If any biopsies were taken you will be contacted by phone or by letter within the next 1-3 weeks.  Call your gastroenterologist if you have not heard about the biopsies in 3 weeks.  Our staff will call the home number listed on your records the next business day following your procedure to check on you and address any questions or concerns that you may have at that time regarding the information given to you following your procedure. This is a courtesy call and so if there is no answer at the home number and we have not heard from you through the emergency physician on call, we will assume that you have returned to your regular daily activities without incident.  SIGNATURES/CONFIDENTIALITY: You and/or your care partner have signed paperwork which will be entered into your electronic medical record.  These signatures attest to the fact that that the information above on your   After Visit Summary has been reviewed and is understood.  Full responsibility of the confidentiality of this discharge information lies with you and/or your care-partner.   

## 2013-01-11 NOTE — Op Note (Signed)
[facility] [address] [place], [address]   COLONOSCOPY PROCEDURE REPORT  PATIENT: Jon Richardson, Jon Richardson  MR#: [id number] BIRTHDATE: 03/08/1961 , 52  yrs. old GENDER: Male ENDOSCOPIST: Hart Carwin, MD REFERRED AO:ZHYQ Carolynn Sayers, M.D. PROCEDURE DATE:  01/11/2013 PROCEDURE:   Colonoscopy, screening First Screening Colonoscopy - Avg.  risk and is 50 yrs.  old or older - No.  Prior Negative Screening - Now for repeat screening. 10 or more years since last screening  History of Adenoma - Now for follow-up colonoscopy & has been > or = to 3 yrs.  N/A  Polyps Removed Today? No.  Recommend repeat exam, <10 yrs? No. ASA CLASS:   Class II INDICATIONS:Average risk patient for colon cancer and last colon 2003- GP with colon cancer. MEDICATIONS: MAC sedation, administered by CRNA and Propofol (Diprivan) 180 mg IV  DESCRIPTION OF PROCEDURE:   After the risks benefits and alternatives of the procedure were thoroughly explained, informed consent was obtained.  A digital rectal exam revealed no abnormalities of the rectum.   The LB [id number]  endoscope was introduced through the anus and advanced to the cecum, which was identified by both the appendix and ileocecal valve. No adverse events experienced.   The quality of the prep was good, using MoviPrep  The instrument was then slowly withdrawn as the colon was fully examined.      COLON FINDINGS: There was mild diverticulosis noted in the sigmoid colon with associated muscular hypertrophy.  Retroflexed views revealed no abnormalities. The time to cecum=3 minutes 43 seconds. Withdrawal time=6 minutes 48 seconds.  The scope was withdrawn and the procedure completed. COMPLICATIONS: There were no complications.  ENDOSCOPIC IMPRESSION: There was mild diverticulosis noted in the sigmoid colon  RECOMMENDATIONS: 1.  High fiber diet 2.   Bentyl 20mg , #30 1 po qd, 3 trefills for IBS/diarrhea, follow up OV to  titrate the dose, or consider Librax   eSigned:  Hart Carwin, MD 01/11/2013 12:37 PM   cc:

## 2013-01-11 NOTE — Progress Notes (Signed)
Patient did not experience any of the following events: a burn prior to discharge; a fall within the facility; wrong site/side/patient/procedure/implant event; or a hospital transfer or hospital admission upon discharge from the facility. (G8907) Patient did not have preoperative order for IV antibiotic SSI prophylaxis. (G8918)  

## 2013-01-11 NOTE — Progress Notes (Signed)
A/ox3 pleased with MAC, report to Celia RN 

## 2013-01-14 ENCOUNTER — Telehealth: Payer: Self-pay

## 2013-01-14 ENCOUNTER — Encounter: Payer: Self-pay | Admitting: *Deleted

## 2013-01-14 NOTE — Telephone Encounter (Signed)
  Follow up Call-  Call back number 01/11/2013  Post procedure Call Back phone  # [phone]  Permission to leave phone message Yes     Patient questions:  Do you have a fever, pain , or abdominal swelling? no Pain Score  0 *  Have you tolerated food without any problems? yes  Have you been able to return to your normal activities? yes  Do you have any questions about your discharge instructions: Diet   no Medications  no Follow up visit  no  Do you have questions or concerns about your Care? no  Actions: * If pain score is 4 or above: No action needed, pain <4.

## 2013-01-28 ENCOUNTER — Telehealth: Payer: Self-pay | Admitting: Internal Medicine

## 2013-01-28 NOTE — Telephone Encounter (Signed)
Spoke with patient and told him this was scheduled off of his procedure report to discuss Bentyl dose and possible medication change.

## 2013-01-28 NOTE — Telephone Encounter (Signed)
Left a message for patient to call me. 

## 2013-01-29 ENCOUNTER — Encounter: Payer: Self-pay | Admitting: Internal Medicine

## 2013-01-29 ENCOUNTER — Other Ambulatory Visit: Payer: BC Managed Care – PPO

## 2013-01-29 ENCOUNTER — Ambulatory Visit (INDEPENDENT_AMBULATORY_CARE_PROVIDER_SITE_OTHER): Payer: BC Managed Care – PPO | Admitting: Internal Medicine

## 2013-01-29 VITALS — BP 100/70 | HR 80 | Ht 67.0 in | Wt 189.2 lb

## 2013-01-29 DIAGNOSIS — R197 Diarrhea, unspecified: Secondary | ICD-10-CM

## 2013-01-29 DIAGNOSIS — K589 Irritable bowel syndrome without diarrhea: Secondary | ICD-10-CM

## 2013-01-29 MED ORDER — LOPERAMIDE HCL 2 MG PO TABS: 2.0000 mg | ORAL_TABLET | Freq: Every day | ORAL | Status: DC

## 2013-01-29 MED ORDER — SERTRALINE HCL 50 MG PO TABS: ORAL_TABLET | ORAL | Status: DC

## 2013-01-29 NOTE — Patient Instructions (Addendum)
Your physician has requested that you go to the basement for the following lab work before leaving today: Celiac 10 profile  We have sent the following medications to your pharmacy for you to pick up at your convenience: Zoloft Imodium  Please purchase the following medications over the counter and take as directed: Pepto Bismol 1-2 tablets as needed for gas  CC: Dr Darryll Capers

## 2013-01-29 NOTE — Progress Notes (Signed)
Jon Richardson. April 19, 1961 MRN [id number]   History of Present Illness:  This is a 52 year old white male with diarrhea predominant irritable bowel syndrome who is complaining of bloating. He also wakes up in the morning with diarrhea and gas and has  At least 3 bowel movements before he goes to work. He denies any rectal bleeding. A recent colonoscopy on 01/11/2013 showed mild diverticulosis of the left colon. We started him on Bentyl, but he reports no improvement. He admits to being under a lot of stress, he works on a commission.. He has had a poor sleep pattern, mostly because of his chronic low back pain. An upper abdominal ultrasound in April 2004 showed fatty liver.   Past Medical History  Diagnosis Date  . BENIGN PROSTATIC HYPERTROPHY, WITH URINARY OBSTRUCTION   . GERD   . HERPES SIMPLEX, UNCOMPLICATED   . LUMBAR RADICULOPATHY, LEFT   . Other and unspecified hyperlipidemia   . Other chronic sinusitis   . Other testicular hypofunction   . PANIC ATTACK, ACUTE   . Cervical pain (neck)   . Fatty liver   . Diverticulosis    Past Surgical History  Procedure Laterality Date  . Hernia repair      Inguinal  . Orchiectomy    . Back surgery  2008  . Lumbar laminectomy    . Ganglion cyst excision      finger  . Colonoscopy    . Tonsillectomy    . Birth mark removal      as baby    reports that he has never smoked. He has never used smokeless tobacco. He reports that he does not drink alcohol or use illicit drugs. family history includes Cancer in his father; Colon cancer in his paternal grandmother; Heart attack in his paternal grandfather; Heart disease in his maternal grandmother and mother; Lung cancer in his father; Prostate cancer in his father and paternal uncle. There is no history of Esophageal cancer, Rectal cancer, or Stomach cancer. Allergies  Allergen Reactions  . Erythromycin Diarrhea    abd cramps        Review of Systems: Negative for heartburn  dysphagia  The remainder of the 10 point ROS is negative except as outlined in H&P   Physical Exam: General appearance  Well developed, in no distress. Neurological alert and oriented x 3. Psychological normal mood and affect.  Assessment and Plan:  Problem #61 52 year old white male who is post screening colonoscopy. He has irritable bowel syndrome with predominant diarrhea. We will put him on Imodium, one at bedtime and Pepto-Bismol 1-2 tablets in the morning for gas. We will check a sprue profile. Because of the stress and anxiety, we will start him on Zoloft 50 mg a day for 2 weeks then 75 mg daily. He will return in 6 months.   01/29/2013 Lina Sar

## 2013-01-30 LAB — CELIAC PANEL 10
Endomysial Screen: NEGATIVE
Tissue Transglut Ab: 6.9 U/mL (ref <20)

## 2013-03-06 ENCOUNTER — Other Ambulatory Visit: Payer: BC Managed Care – PPO

## 2013-03-13 ENCOUNTER — Encounter: Payer: BC Managed Care – PPO | Admitting: Internal Medicine

## 2013-03-25 DIAGNOSIS — C4491 Basal cell carcinoma of skin, unspecified: Secondary | ICD-10-CM

## 2013-03-25 HISTORY — DX: Basal cell carcinoma of skin, unspecified: C44.91

## 2013-05-13 ENCOUNTER — Telehealth: Payer: Self-pay | Admitting: Internal Medicine

## 2013-05-13 ENCOUNTER — Other Ambulatory Visit: Payer: Self-pay | Admitting: *Deleted

## 2013-05-13 MED ORDER — AZITHROMYCIN 250 MG PO TABS: ORAL_TABLET | ORAL | Status: DC

## 2013-05-13 NOTE — Telephone Encounter (Signed)
Pt states he has a sinus inf and dr Arnoldo Morale has rx him an antibiotic in the past would like to know if he could send another. Congestion, drainage /One week. Refused triage, wanted to leave VM [facility]/ [facility]

## 2013-05-13 NOTE — Telephone Encounter (Signed)
May have z pack per dr Arnoldo Morale

## 2013-07-08 ENCOUNTER — Encounter: Payer: Self-pay | Admitting: Internal Medicine

## 2013-07-08 ENCOUNTER — Ambulatory Visit (INDEPENDENT_AMBULATORY_CARE_PROVIDER_SITE_OTHER): Payer: BC Managed Care – PPO | Admitting: Internal Medicine

## 2013-07-08 VITALS — BP 120/80 | HR 83 | Temp 98.1°F | Resp 20 | Ht 67.0 in | Wt 191.0 lb

## 2013-07-08 DIAGNOSIS — B9689 Other specified bacterial agents as the cause of diseases classified elsewhere: Secondary | ICD-10-CM

## 2013-07-08 DIAGNOSIS — M549 Dorsalgia, unspecified: Secondary | ICD-10-CM

## 2013-07-08 DIAGNOSIS — J329 Chronic sinusitis, unspecified: Secondary | ICD-10-CM

## 2013-07-08 DIAGNOSIS — IMO0002 Reserved for concepts with insufficient information to code with codable children: Secondary | ICD-10-CM

## 2013-07-08 DIAGNOSIS — M792 Neuralgia and neuritis, unspecified: Secondary | ICD-10-CM

## 2013-07-08 DIAGNOSIS — A499 Bacterial infection, unspecified: Secondary | ICD-10-CM

## 2013-07-08 MED ORDER — GABAPENTIN 100 MG PO CAPS: 100.0000 mg | ORAL_CAPSULE | Freq: Three times a day (TID) | ORAL | Status: DC

## 2013-07-08 MED ORDER — GABAPENTIN 300 MG PO CAPS: 300.0000 mg | ORAL_CAPSULE | Freq: Two times a day (BID) | ORAL | Status: DC

## 2013-07-08 MED ORDER — LEVOFLOXACIN 500 MG PO TABS: 500.0000 mg | ORAL_TABLET | Freq: Every day | ORAL | Status: DC

## 2013-07-08 NOTE — Patient Instructions (Signed)
Take two 100 mg at bedtime and one in the morning

## 2013-07-08 NOTE — Progress Notes (Signed)
Subjective:    Patient ID: Jon Richardson., male    DOB: Sep 27, 1960, 53 y.o.   MRN: [id number]  Sore Throat  Associated symptoms include ear pain and headaches. Pertinent negatives include no abdominal pain, congestion, coughing, neck pain or shortness of breath.  Otalgia  Associated symptoms include headaches. Pertinent negatives include no abdominal pain, coughing, hearing loss, neck pain or rash.    basal cell carcinoma of the scalp and underwent 5 biopsies at the [facility] with final margins clear. Reason is followed for chronic lumbar radiculopathy and cervical disc radiculopathy. Has has a history of BPH Acute problems of back pain that interferes with sleep.      Review of Systems  Constitutional: Negative for fever and fatigue.  HENT: Positive for ear pain. Negative for congestion, hearing loss and postnasal drip.   Eyes: Negative for discharge, redness and visual disturbance.  Respiratory: Negative for cough, shortness of breath and wheezing.   Cardiovascular: Negative for leg swelling.  Gastrointestinal: Negative for abdominal pain, constipation and abdominal distention.  Genitourinary: Negative for urgency and frequency.  Musculoskeletal: Positive for arthralgias and back pain. Negative for joint swelling and neck pain.  Skin: Negative for color change and rash.  Neurological: Positive for headaches. Negative for weakness and light-headedness.  Hematological: Negative for adenopathy.  Psychiatric/Behavioral: Negative for behavioral problems.   Past Medical History  Diagnosis Date  . BENIGN PROSTATIC HYPERTROPHY, WITH URINARY OBSTRUCTION   . GERD   . HERPES SIMPLEX, UNCOMPLICATED   . LUMBAR RADICULOPATHY, LEFT   . Other and unspecified hyperlipidemia   . Other chronic sinusitis   . Other testicular hypofunction   . PANIC ATTACK, ACUTE   . Cervical pain (neck)   . Fatty liver   . Diverticulosis     History   Social History  . Marital Status:  Single    Spouse Name: N/A    Number of Children: N/A  . Years of Education: N/A   Occupational History  . Not on file.   Social History Main Topics  . Smoking status: Never Smoker   . Smokeless tobacco: Never Used  . Alcohol Use: No  . Drug Use: No  . Sexual Activity: Yes   Other Topics Concern  . Not on file   Social History Narrative  . No narrative on file    Past Surgical History  Procedure Laterality Date  . Hernia repair      Inguinal  . Orchiectomy    . Back surgery  2008  . Lumbar laminectomy    . Ganglion cyst excision      finger  . Colonoscopy    . Tonsillectomy    . Birth mark removal      as baby    Family History  Problem Relation Age of Onset  . Heart disease Mother   . Prostate cancer Father   . Cancer Father     lung cancer  . Lung cancer Father   . Colon cancer Paternal Grandmother   . Heart attack Paternal Grandfather   . Esophageal cancer Neg Hx   . Rectal cancer Neg Hx   . Stomach cancer Neg Hx   . Prostate cancer Paternal Uncle   . Heart disease Maternal Grandmother     Allergies  Allergen Reactions  . Erythromycin Diarrhea    abd cramps    Current Outpatient Prescriptions on File Prior to Visit  Medication Sig Dispense Refill  . ACIPHEX 20 MG tablet TAKE ONE  TABLET BY MOUTH TWO TIMES A DAY  60 tablet  4  . ALPRAZolam (XANAX) 1 MG tablet TAKE 1/2 TO 1 TABLET BY MOUTH EVERY MORNING AS NEEDED.  30 tablet  5  . diazepam (VALIUM) 5 MG tablet Take 5 mg by mouth. i 3 time a days as needed for muscle relaxer       . HYDROcodone-acetaminophen (NORCO) 10-325 MG per tablet Take 1 tablet by mouth every 6 (six) hours as needed for pain.      . valACYclovir (VALTREX) 500 MG tablet TAKE ONE TABLET BY MOUTH EVERY DAY AS NEEDED  30 tablet  1   No current facility-administered medications on file prior to visit.    BP 120/80  Pulse 83  Temp(Src) 98.1 F (36.7 C) (Oral)  Resp 20  Ht 5\' 7"  (1.702 m)  Wt 191 lb (86.637 kg)  BMI 29.91  kg/m2        Objective:   Physical Exam  Nursing note and vitals reviewed. Constitutional: He appears well-developed and well-nourished.  HENT:  Head: Normocephalic and atraumatic.  Eyes: Conjunctivae are normal. Pupils are equal, round, and reactive to light.  Neck: Normal range of motion. Neck supple.  Cardiovascular: Normal rate and regular rhythm.   Pulmonary/Chest: Effort normal and breath sounds normal.  Abdominal: Soft. Bowel sounds are normal.  Musculoskeletal: He exhibits tenderness.  Psychiatric: He has a normal mood and affect. His behavior is normal.          Assessment & Plan:  Neurosurgeon has told hm that pain management is the best option  Has not seen [facility] for injection Chronic pain management  Neck and back issues Chronic nerve damage  Feet burning and other signs of neuropathic pain  gabapentin  trial

## 2013-07-08 NOTE — Progress Notes (Signed)
Pre-visit discussion using our clinic review tool. No additional management support is needed unless otherwise documented below in the visit note.  

## 2013-07-24 ENCOUNTER — Other Ambulatory Visit (INDEPENDENT_AMBULATORY_CARE_PROVIDER_SITE_OTHER): Payer: BC Managed Care – PPO

## 2013-07-24 DIAGNOSIS — Z0000 Encounter for general adult medical examination without abnormal findings: Secondary | ICD-10-CM

## 2013-07-24 LAB — LIPID PANEL
Cholesterol: 254 mg/dL — ABNORMAL HIGH (ref 0–200)
HDL: 40.5 mg/dL
LDL Cholesterol: 106 mg/dL — ABNORMAL HIGH (ref 0–99)
Total CHOL/HDL Ratio: 6
Triglycerides: 536 mg/dL — ABNORMAL HIGH (ref 0.0–149.0)
VLDL: 107.2 mg/dL — ABNORMAL HIGH (ref 0.0–40.0)

## 2013-07-24 LAB — HEPATIC FUNCTION PANEL
ALT: 45 U/L (ref 0–53)
AST: 25 U/L (ref 0–37)
Albumin: 4.1 g/dL (ref 3.5–5.2)
Alkaline Phosphatase: 46 U/L (ref 39–117)
Bilirubin, Direct: 0 mg/dL (ref 0.0–0.3)
Total Bilirubin: 0.5 mg/dL (ref 0.3–1.2)
Total Protein: 6.7 g/dL (ref 6.0–8.3)

## 2013-07-24 LAB — BASIC METABOLIC PANEL
BUN: 18 mg/dL (ref 6–23)
CO2: 28 mEq/L (ref 19–32)
Calcium: 10 mg/dL (ref 8.4–10.5)
Chloride: 104 mEq/L (ref 96–112)
Creatinine, Ser: 1.1 mg/dL (ref 0.4–1.5)
GFR: 72.91 mL/min (ref >60.00)
Glucose, Bld: 112 mg/dL — ABNORMAL HIGH (ref 70–99)
POTASSIUM: 5.1 meq/L (ref 3.5–5.1)
Sodium: 140 mEq/L (ref 135–145)

## 2013-07-24 LAB — POCT URINALYSIS DIPSTICK
Bilirubin, UA: NEGATIVE
Blood, UA: NEGATIVE
Glucose, UA: NEGATIVE
Ketones, UA: NEGATIVE
Leukocytes, UA: NEGATIVE
Nitrite, UA: NEGATIVE
Protein, UA: NEGATIVE
Spec Grav, UA: >=1.03
Urobilinogen, UA: 0.2
pH, UA: 5

## 2013-07-24 LAB — PSA: PSA: 0.72 ng/mL (ref 0.10–4.00)

## 2013-07-24 LAB — CBC WITH DIFFERENTIAL/PLATELET
Basophils Absolute: 0.1 10*3/uL (ref 0.0–0.1)
Basophils Relative: 0.7 % (ref 0.0–3.0)
Eosinophils Absolute: 0.2 10*3/uL (ref 0.0–0.7)
Eosinophils Relative: 2.5 % (ref 0.0–5.0)
HCT: 44.3 % (ref 39.0–52.0)
Hemoglobin: 14.8 g/dL (ref 13.0–17.0)
Lymphocytes Relative: 36.7 % (ref 12.0–46.0)
Lymphs Abs: 2.8 10*3/uL (ref 0.7–4.0)
MCHC: 33.3 g/dL (ref 30.0–36.0)
MCV: 91.3 fl (ref 78.0–100.0)
Monocytes Absolute: 0.6 10*3/uL (ref 0.1–1.0)
Monocytes Relative: 7.8 % (ref 3.0–12.0)
Neutro Abs: 4 10*3/uL (ref 1.4–7.7)
Neutrophils Relative %: 52.3 % (ref 43.0–77.0)
Platelets: 322 10*3/uL (ref 150.0–400.0)
RBC: 4.86 Mil/uL (ref 4.22–5.81)
RDW: 13.6 % (ref 11.5–14.6)
WBC: 7.7 10*3/uL (ref 4.5–10.5)

## 2013-07-24 LAB — TSH: TSH: 4.45 u[IU]/mL (ref 0.35–5.50)

## 2013-07-31 ENCOUNTER — Encounter: Payer: BC Managed Care – PPO | Admitting: Internal Medicine

## 2013-08-02 ENCOUNTER — Encounter: Payer: Self-pay | Admitting: Internal Medicine

## 2013-08-02 ENCOUNTER — Ambulatory Visit (INDEPENDENT_AMBULATORY_CARE_PROVIDER_SITE_OTHER): Payer: BC Managed Care – PPO | Admitting: Internal Medicine

## 2013-08-02 VITALS — BP 130/90 | Temp 98.1°F | Wt 192.0 lb

## 2013-08-02 DIAGNOSIS — Z0000 Encounter for general adult medical examination without abnormal findings: Secondary | ICD-10-CM

## 2013-08-02 DIAGNOSIS — R7309 Other abnormal glucose: Secondary | ICD-10-CM

## 2013-08-02 DIAGNOSIS — R7303 Prediabetes: Secondary | ICD-10-CM

## 2013-08-02 MED ORDER — HYDROCODONE-ACETAMINOPHEN 10-325 MG PO TABS: 1.0000 | ORAL_TABLET | Freq: Four times a day (QID) | ORAL | Status: DC | PRN

## 2013-08-02 MED ORDER — VALACYCLOVIR HCL 500 MG PO TABS: ORAL_TABLET | ORAL | Status: DC

## 2013-08-02 MED ORDER — DIAZEPAM 5 MG PO TABS: ORAL_TABLET | ORAL | Status: DC

## 2013-08-02 NOTE — Progress Notes (Signed)
Subjective:    Patient ID: Jon Hipp., male    DOB: [birth year], 53 y.o.   MRN: [id number]  HPI CPX Anxiety, chronic neuropathic pain and GERD Had a presyncopal episode Pre diabetes Weight gain and prediabetes   Review of Systems  Constitutional: Negative for fever and fatigue.  HENT: Negative for congestion, hearing loss and postnasal drip.   Eyes: Negative for discharge, redness and visual disturbance.  Respiratory: Negative for cough, shortness of breath and wheezing.   Cardiovascular: Negative for leg swelling.  Gastrointestinal: Negative for abdominal pain, constipation and abdominal distention.  Genitourinary: Negative for urgency and frequency.  Musculoskeletal: Negative for arthralgias, joint swelling and neck pain.  Skin: Negative for color change and rash.  Neurological: Negative for weakness and light-headedness.  Hematological: Negative for adenopathy.  Psychiatric/Behavioral: Negative for behavioral problems.       Past Medical History  Diagnosis Date  . BENIGN PROSTATIC HYPERTROPHY, WITH URINARY OBSTRUCTION   . GERD   . HERPES SIMPLEX, UNCOMPLICATED   . LUMBAR RADICULOPATHY, LEFT   . Other and unspecified hyperlipidemia   . Other chronic sinusitis   . Other testicular hypofunction   . PANIC ATTACK, ACUTE   . Cervical pain (neck)   . Fatty liver   . Diverticulosis     History   Social History  . Marital Status: Single    Spouse Name: N/A    Number of Children: N/A  . Years of Education: N/A   Occupational History  . Not on file.   Social History Main Topics  . Smoking status: Never Smoker   . Smokeless tobacco: Never Used  . Alcohol Use: No  . Drug Use: No  . Sexual Activity: Yes   Other Topics Concern  . Not on file   Social History Narrative  . No narrative on file    Past Surgical History  Procedure Laterality Date  . Hernia repair      Inguinal  . Orchiectomy    . Back surgery  2008  . Lumbar laminectomy    .  Ganglion cyst excision      finger  . Colonoscopy    . Tonsillectomy    . Birth mark removal      as baby    Family History  Problem Relation Age of Onset  . Heart disease Mother   . Prostate cancer Father   . Cancer Father     lung cancer  . Lung cancer Father   . Colon cancer Paternal Grandmother   . Heart attack Paternal Grandfather   . Esophageal cancer Neg Hx   . Rectal cancer Neg Hx   . Stomach cancer Neg Hx   . Prostate cancer Paternal Uncle   . Heart disease Maternal Grandmother   . Cancer Maternal Uncle     prostate cancer    Allergies  Allergen Reactions  . Erythromycin Diarrhea    abd cramps    Current Outpatient Prescriptions on File Prior to Visit  Medication Sig Dispense Refill  . ACIPHEX 20 MG tablet TAKE ONE TABLET BY MOUTH TWO TIMES A DAY  60 tablet  4  . ALPRAZolam (XANAX) 1 MG tablet TAKE 1/2 TO 1 TABLET BY MOUTH EVERY MORNING AS NEEDED.  30 tablet  5  . diazepam (VALIUM) 5 MG tablet Take 5 mg by mouth. i 3 time a days as needed for muscle relaxer       . gabapentin (NEURONTIN) 100 MG capsule Take 1 capsule (100  mg total) by mouth 3 (three) times daily.  90 capsule  0  . gabapentin (NEURONTIN) 300 MG capsule Take 1 capsule (300 mg total) by mouth 2 (two) times daily.  60 capsule  6  . HYDROcodone-acetaminophen (NORCO) 10-325 MG per tablet Take 1 tablet by mouth every 6 (six) hours as needed for pain.      . valACYclovir (VALTREX) 500 MG tablet TAKE ONE TABLET BY MOUTH EVERY DAY AS NEEDED  30 tablet  1   No current facility-administered medications on file prior to visit.    BP 130/90  Temp(Src) 98.1 F (36.7 C) (Oral)  Wt 192 lb (87.091 kg)    Objective:   Physical Exam  Constitutional: He is oriented to person, place, and time. He appears well-developed and well-nourished.  HENT:  Head: Normocephalic and atraumatic.  Eyes: Conjunctivae are normal. Pupils are equal, round, and reactive to light.  Neck: Normal range of motion. Neck supple.    Cardiovascular: Normal rate and regular rhythm.   Pulmonary/Chest: Effort normal and breath sounds normal.  Abdominal: Soft. Bowel sounds are normal.  Genitourinary: Rectum normal and prostate normal.  Musculoskeletal: He exhibits edema and tenderness.  Neurological: He is alert and oriented to person, place, and time.  Skin: Skin is warm and dry.  Psychiatric: He has a normal mood and affect. His behavior is normal.          Assessment & Plan:  Patient presents for yearly preventative medicine examination.ROS questionnaire was completed  All immunizations and health maintenance protocols were reviewed with the patient and needed orders were placed.  Appropriate screening laboratory values were ordered for the patient including screening of hyperlipidemia, renal function and hepatic function. If indicated by BPH, a PSA was ordered.  Medication reconciliation,  past medical history, social history, problem list and allergies were reviewed in detail with the patient  Goals were established with regard to weight loss, exercise, and  diet in compliance with medications  End of life planning was discussed.  Prediabetes is new dx and risk stratification  Follow up in 3 months with MATT

## 2013-08-02 NOTE — Progress Notes (Signed)
Pre visit review using our clinic review tool, if applicable. No additional management support is needed unless otherwise documented below in the visit note. 

## 2013-08-02 NOTE — Patient Instructions (Signed)
weight loss and exercize

## 2013-08-02 NOTE — Addendum Note (Signed)
Addended by: Westley Hummer B on: 08/02/2013 02:43 PM   Modules accepted: Orders

## 2013-08-20 ENCOUNTER — Encounter: Payer: Self-pay | Admitting: Internal Medicine

## 2013-12-11 ENCOUNTER — Telehealth: Payer: Self-pay | Admitting: Internal Medicine

## 2013-12-11 MED ORDER — ALPRAZOLAM 1 MG PO TABS: ORAL_TABLET | ORAL | Status: DC

## 2013-12-11 NOTE — Telephone Encounter (Signed)
[place] is requesting re-fill on ALPRAZolam (XANAX) 1 MG tablet Pt is scheduled to see Matt on 12/19/13

## 2013-12-11 NOTE — Telephone Encounter (Signed)
Pt called today bc he was upset he needed to make an appt to see [facility] for his Xanax refill . Pt states dr Arnoldo Morale was to set up his appt.  Pt hung up on me and was unable to finish the conversation. Advised Alisha pt had an appt. this thurs.

## 2013-12-11 NOTE — Telephone Encounter (Signed)
Rx called in to pharmacy. 

## 2013-12-12 NOTE — Telephone Encounter (Signed)
Left a message for return call.  

## 2013-12-16 NOTE — Telephone Encounter (Signed)
Given to [place]

## 2013-12-19 ENCOUNTER — Ambulatory Visit (INDEPENDENT_AMBULATORY_CARE_PROVIDER_SITE_OTHER): Payer: BC Managed Care – PPO | Admitting: Physician Assistant

## 2013-12-19 ENCOUNTER — Encounter: Payer: Self-pay | Admitting: Physician Assistant

## 2013-12-19 VITALS — BP 128/82 | HR 60 | Temp 98.2°F | Resp 18 | Ht 67.0 in | Wt 190.8 lb

## 2013-12-19 DIAGNOSIS — R7303 Prediabetes: Secondary | ICD-10-CM

## 2013-12-19 DIAGNOSIS — F411 Generalized anxiety disorder: Secondary | ICD-10-CM

## 2013-12-19 DIAGNOSIS — M549 Dorsalgia, unspecified: Secondary | ICD-10-CM

## 2013-12-19 DIAGNOSIS — G8929 Other chronic pain: Secondary | ICD-10-CM

## 2013-12-19 DIAGNOSIS — R7309 Other abnormal glucose: Secondary | ICD-10-CM

## 2013-12-19 MED ORDER — ESCITALOPRAM OXALATE 10 MG PO TABS: 10.0000 mg | ORAL_TABLET | Freq: Every day | ORAL | Status: DC

## 2013-12-19 NOTE — Patient Instructions (Addendum)
Lexapro once daily for . This will take several weeks to build up in your system before you can expect to see results, so don't get discouraged if you don't see immediate relief from anxiety.  Dispose of your Valium at a pharmacy since we won't be taking this anymore.  Continue trying to manage your prediabetes with diet and exercise.  If emergency symptoms discussed during visit developed, seek medical attention immediately.  Followup in about 1 month to reassess, or for worsening or persistent symptoms despite treatment.     Generalized Anxiety Disorder Generalized anxiety disorder (GAD) is a mental disorder. It interferes with life functions, including relationships, work, and school. GAD is different from normal anxiety, which everyone experiences at some point in their lives in response to specific life events and activities. Normal anxiety actually helps [place] prepare for and get through these life events and activities. Normal anxiety goes away after the event or activity is over.  GAD causes anxiety that is not necessarily related to specific events or activities. It also causes excess anxiety in proportion to specific events or activities. The anxiety associated with GAD is also difficult to control. GAD can vary from mild to severe. People with severe GAD can have intense waves of anxiety with physical symptoms (panic attacks).  SYMPTOMS The anxiety and worry associated with GAD are difficult to control. This anxiety and worry are related to many life events and activities and also occur more days than not for 6 months or longer. People with GAD also have three or more of the following symptoms (one or more in children):  Restlessness.   Fatigue.  Difficulty concentrating.   Irritability.  Muscle tension.  Difficulty sleeping or unsatisfying sleep. DIAGNOSIS GAD is diagnosed through an assessment by your health care provider. Your health care provider will ask you questions  aboutyour mood,physical symptoms, and events in your life. Your health care provider may ask you about your medical history and use of alcohol or drugs, including prescription medicines. Your health care provider may also do a physical exam and blood tests. Certain medical conditions and the use of certain substances can cause symptoms similar to those associated with GAD. Your health care provider may refer you to a mental health specialist for further evaluation. TREATMENT The following therapies are usually used to treat GAD:   Medication. Antidepressant medication usually is prescribed for long-term daily control. Antianxiety medicines may be added in severe cases, especially when panic attacks occur.   Talk therapy (psychotherapy). Certain types of talk therapy can be helpful in treating GAD by providing support, education, and guidance. A form of talk therapy called cognitive behavioral therapy can teach you healthy ways to think about and react to daily life events and activities.  Stress managementtechniques. These include yoga, meditation, and exercise and can be very helpful when they are practiced regularly. A mental health specialist can help determine which treatment is best for you. Some people see improvement with one therapy. However, other people require a combination of therapies. Document Released: 08/06/2012 Document Revised: 08/26/2013 Document Reviewed: 08/06/2012 [facility] Patient Information 2015 [place], [place]. This information is not intended to replace advice given to you by your health care provider. Make sure you discuss any questions you have with your health care provider. Diabetes, Eating Away From Home Sometimes, you might eat in a restaurant or have meals that are prepared by someone else. You can enjoy eating out. However, the portions in restaurants may be much larger than needed.  Listed below are some ideas to help you choose foods that will keep your blood  glucose (sugar) in better control.  TIPS FOR EATING OUT  Know your meal plan and how many carbohydrate servings you should have at each meal. You may wish to carry a copy of your meal plan in your purse or wallet. Learn the foods included in each food group.  Make a list of restaurants near you that offer healthy choices. Take a copy of the carry-out menus to see what they offer. Then, you can plan what you will order ahead of time.  Become familiar with serving sizes by practicing them at home using measuring cups and spoons. Once you learn to recognize portion sizes, you will be able to correctly estimate the amount of total carbohydrate you are allowed to eat at the restaurant. Ask for a takeout box if the portion is more than you should have. When your food comes, leave the amount you should have on the plate, and put the rest in the takeout box before you start eating.  Plan ahead if your mealtime will be different from usual. Check with your caregiver to find out how to time meals and medicine if you are taking insulin.  Avoid high-fat foods, such as fried foods, cream sauces, high-fat salad dressings, or any added butter or margarine.  Do not be afraid to ask questions. Ask your server about the portion size, cooking methods, ingredients and if items can be substituted. Restaurants do not list all available items on the menu. You can ask for your main entree to be prepared using skim milk, oil instead of butter or margarine, and without gravy or sauces. Ask your waiter or waitress to serve salad dressings, gravy, sauces, margarine, and sour cream on the side. You can then add the amount your meal plan suggests.  Add more vegetables whenever possible.  Avoid items that are labeled "jumbo," "giant," "deluxe," or "supersized."  You may want to split an entre with someone and order an extra side salad.  Watch for hidden calories in foods like croutons, bacon, or cheese.  Ask your server to  take away the bread basket or chips from your table.  Order a dinner salad as an appetizer. You can eat most foods served in a restaurant. Some foods are better choices than others. Breads and Starches  Recommended: All kinds of bread (wheat, rye, white, oatmeal, [place], [place], raisin), hard or soft dinner rolls, frankfurter or hamburger buns, small bagels, small corn or whole-wheat flour tortillas.  Avoid: Frosted or glazed breads, butter rolls, egg or cheese breads, croissants, sweet rolls, pastries, coffee cake, glazed or frosted doughnuts, muffins. Crackers  Recommended: Animal crackers, graham, rye, saltine, oyster, and matzoth crackers. Bread sticks, melba toast, rusks, pretzels, popcorn (without fat), zwieback toast.  Avoid: High-fat snack crackers or chips. Buttered popcorn. Cereals  Recommended: Hot and cold cereals. Whole grains such as oatmeal or shredded wheat are good choices.  Avoid: Sugar-coated or granola type cereals. Potatoes/Pasta/Rice/Beans  Recommended: Order baked, boiled, or mashed potatoes, rice or noodles without added fat, whole beans. Order gravies, butter, margarine, or sauces on the side so you can control the amount you add.  Avoid: Hash browns or fried potatoes. Potatoes, pasta, or rice prepared with cream or cheese sauce. Potato or pasta salads prepared with large amounts of dressing. Fried beans or fried rice. Vegetables  Recommended: Order steamed, baked, boiled, or stewed vegetables without sauces or extra fat. Ask that sauce be  served on the side. If vegetables are not listed on the menu, ask what is available.  Avoid: Vegetables prepared with cream, butter, or cheese sauce. Fried vegetables. Salad Bars  Recommended: Many of the vegetables at a salad bar are considered "free." Use lemon juice, vinegar, or low-calorie salad dressing (fewer than 20 calories per serving) as "free" dressings for your salad. Look for salad bar ingredients that have no  added fat or sugar such as tomatoes, lettuce, cucumbers, broccoli, carrots, onions, and mushrooms.  Avoid: Prepared salads with large amounts of dressing, such as coleslaw, caesar salad, macaroni salad, bean salad, or carrot salad. Fruit  Recommended: Eat fresh fruit or fresh fruit salad without added dressing. A salad bar often offers fresh fruit choices, but canned fruit at a restaurant is usually packed in sugar or syrup.  Avoid: Sweetened canned or frozen fruits, plain or sweetened fruit juice. Fruit salads with dressing, sour cream, or sugar added to them. Meat and Meat Substitutes  Recommended: Order broiled, baked, roasted, or grilled meat, poultry, or fish. Trim off all visible fat. Do not eat the skin of poultry. The size stated on the menu is the raw weight. Meat shrinks by  in cooking (for example, 4 oz raw equals 3 oz cooked meat).  Avoid: Deep-fat fried meat, poultry, or fish. Breaded meats. Eggs  Recommended: Order soft, hard-cooked, poached, or scrambled eggs. Omelets may be okay, depending on what ingredients are added. Egg substitutes are also a good choice.  Avoid: Fried eggs, eggs prepared with cream or cheese sauce. Milk  Recommended: Order low-fat or fat-free milk according to your meal plan. Plain, nonfat yogurt or flavored yogurt with no sugar added may be used as a substitute for milk. Soy milk may also be used.  Avoid: Milk shakes or sweetened milk beverages. Soups and Combination Foods  Recommended: Clear broth or consomm are "free" foods and may be used as an appetizer. Broth-based soups with fat removed count as a starch serving and are preferred over cream soups. Soups made with beans or split peas may be eaten but count as a starch.  Avoid: Fatty soups, soup made with cream, cheese soup. Combination foods prepared with excessive amounts of fat or with cream or cheese sauces. Desserts and Sweets  Recommended: Ask for fresh fruit. Sponge or angel food cake  without icing, ice milk, no sugar added ice cream, sherbet, or frozen yogurt may fit into your meal plan occasionally.  Avoid: Pastries, puddings, pies, cakes with icing, custard, gelatin desserts. Fats and Oils  Recommended: Choose healthy fats such as olive oil, canola oil, or tub margarine, reduced fat or fat-free sour cream, cream cheese, avocado, or nuts.  Avoid: Any fats in excess of your allowed portion. Deep-fried foods or any food with a large amount of fat. Note: Ask for all fats to be served on the side, and limit your portion sizes according to your meal plan. Document Released: 04/11/2005 Document Revised: 07/04/2011 Document Reviewed: 07/09/2013 [facility] Patient Information 2015 [place], [place]. This information is not intended to replace advice given to you by your health care provider. Make sure you discuss any questions you have with your health care provider.

## 2013-12-19 NOTE — Progress Notes (Signed)
Subjective:    Patient ID: Jon Hipp., male    DOB: [birth year], 53 y.o.   MRN: [id number]  HPI Patient presents to clinic today to establish care.  Acute Concerns: Anxiety- Has taken Xanax as needed for this, as he has had a history of panic attacks. States that Xanax makes him feel exhausted, and so he does not like taking it. He states he takes this very rarely, and has taken a half pill of this in the last week. He states that he has not had panic attacks recently, however has been noticing increased stress, which he thinks maybe work related. He states he tolerates the Xanax well despite the drowsiness, and denies any other adverse effects. He denies hopelessness, helplessness, suicidal ideation, homicidal ideation.  Chronic Issues: Prediabetic- States his last PCP was concerned that he was prediabetic. He has never had an A1c. He is trying to manage this with diet and exercise.   Chronic Pain- Has back pain with history back surgery in 2007. Seen by Dr. Governor Rooks for chronic back pain. Has not seen in a while. Currently uses hydrocodone-acetaminophen for this as needed. He states he does not remember the last time he took this. He states that he just has it on hand incase his back pain becomes severe. He states currently he is on a regimen of glucosamine/chondroitin, which he believes has been helping his musculoskeletal complaints. He states that he tolerates the hydrocodone acetaminophen well, with the exception of mild itchiness which he takes Benadryl to relieve. He denies any throat swelling with taking this medication. He denies any other adverse effects of taking this medication.  He also has a prescription of Valium which he believes he was put on for muscle relaxation instead of flexeril, but he does not know why. He states he has never tried a muscle relaxant such as Flexeril to his knowledge. He also denies taking this medication, as he does not need it, and does not wish  to have this refilled or try any muscle relaxants at this time.   Health Maintenance: Dental -- Dr. Ronnald Ramp, 3 times per year. Vision -- Has glasses and contacts, seen yearly. [facility]. Immunizations -- UTD. Colonoscopy -- Just had colonoscopy last year, normal, return in 10 years.     Review of Systems Patient denies fevers, chills, nausea, vomiting, diarrhea, chest pain, shortness of breath, orthopnea, headache, syncope. Denies lower extremity edema, abdominal pain, change in appetite, change in bowel movements. Patient denies rashes, musculoskeletal complaints. No other specific complaints in a complete review of systems.     Past Medical History  Diagnosis Date  . BENIGN PROSTATIC HYPERTROPHY, WITH URINARY OBSTRUCTION   . GERD   . HERPES SIMPLEX, UNCOMPLICATED   . LUMBAR RADICULOPATHY, LEFT   . Other and unspecified hyperlipidemia   . Other chronic sinusitis   . Other testicular hypofunction   . PANIC ATTACK, ACUTE   . Cervical pain (neck)   . Fatty liver   . Diverticulosis     History   Social History  . Marital Status: Single    Spouse Name: N/A    Number of Children: N/A  . Years of Education: N/A   Occupational History  . Not on file.   Social History Main Topics  . Smoking status: Never Smoker   . Smokeless tobacco: Never Used  . Alcohol Use: No  . Drug Use: No  . Sexual Activity: Yes   Other Topics Concern  . Not on  file   Social History Narrative  . No narrative on file    Past Surgical History  Procedure Laterality Date  . Hernia repair      Inguinal  . Orchiectomy    . Back surgery  2008  . Lumbar laminectomy    . Ganglion cyst excision      finger  . Colonoscopy    . Tonsillectomy    . Birth mark removal      as baby    Family History  Problem Relation Age of Onset  . Heart disease Mother   . Prostate cancer Father   . Cancer Father     lung cancer  . Lung cancer Father   . Colon cancer Paternal Grandmother   . Heart attack  Paternal Grandfather   . Esophageal cancer Neg Hx   . Rectal cancer Neg Hx   . Stomach cancer Neg Hx   . Prostate cancer Paternal Uncle   . Heart disease Maternal Grandmother   . Cancer Maternal Uncle     prostate cancer    Allergies  Allergen Reactions  . Erythromycin Diarrhea    abd cramps    Current Outpatient Prescriptions on File Prior to Visit  Medication Sig Dispense Refill  . ALPRAZolam (XANAX) 1 MG tablet TAKE 1/2 TO 1 TABLET BY MOUTH EVERY MORNING AS NEEDED.  30 tablet  5  . diazepam (VALIUM) 5 MG tablet i 3 time a days as needed for muscle relaxer  30 tablet  5  . gabapentin (NEURONTIN) 300 MG capsule Take 1 capsule (300 mg total) by mouth 2 (two) times daily.  60 capsule  6  . HYDROcodone-acetaminophen (NORCO) 10-325 MG per tablet Take 1 tablet by mouth every 6 (six) hours as needed.  30 tablet  0  . valACYclovir (VALTREX) 500 MG tablet TAKE ONE TABLET BY MOUTH EVERY DAY AS NEEDED  30 tablet  5   No current facility-administered medications on file prior to visit.   The PFS history was reviewed with the patient at time of visit.  EXAM: BP 128/82  Pulse 60  Temp(Src) 98.2 F (36.8 C) (Oral)  Resp 18  Ht 5\' 7"  (1.702 m)  Wt 190 lb 12.8 oz (86.546 kg)  BMI 29.88 kg/m2     Objective:   Physical Exam  Nursing note and vitals reviewed. Constitutional: He is oriented to person, place, and time. He appears well-developed and well-nourished. No distress.  HENT:  Head: Normocephalic and atraumatic.  Eyes: Conjunctivae and EOM are normal. Pupils are equal, round, and reactive to light.  Neck: Normal range of motion.  Cardiovascular: Normal rate, regular rhythm and intact distal pulses.   Pulmonary/Chest: Effort normal and breath sounds normal. No respiratory distress. He has no wheezes. He has no rales. He exhibits no tenderness.  Musculoskeletal: Normal range of motion.  Neurological: He is alert and oriented to person, place, and time.  Skin: Skin is warm and  dry. He is not diaphoretic.  Psychiatric: He has a normal mood and affect. His behavior is normal. Judgment and thought content normal.     Lab Results  Component Value Date   WBC 7.7 07/24/2013   HGB 14.8 07/24/2013   HCT 44.3 07/24/2013   PLT 322.0 07/24/2013   GLUCOSE 112* 07/24/2013   CHOL 254* 07/24/2013   TRIG 536.0* 07/24/2013   HDL 40.50 07/24/2013   LDLDIRECT 126.6 01/27/2012   LDLCALC 106* 07/24/2013   ALT 45 07/24/2013   AST 25 07/24/2013  NA 140 07/24/2013   K 5.1 07/24/2013   CL 104 07/24/2013   CREATININE 1.1 07/24/2013   BUN 18 07/24/2013   CO2 28 07/24/2013   TSH 4.45 07/24/2013   PSA 0.72 07/24/2013         Assessment & Plan:  Jon Richardson was seen today for establish care.  Diagnoses and associated orders for this visit:  Anxiety state, unspecified Comments: More anxiety recently, work related, trial lexapro daily. Xanax for panic symptoms only. - escitalopram (LEXAPRO) 10 MG tablet; Take 1 tablet (10 mg total) by mouth daily.  Chronic back pain Comments: Controlled with hydrocodone as needed, used infrequently, tolerating well, will continue for now. Will refer to pain management if needed in the future.  Prediabetes Comments: Never had A1c, last fasting sugar 112. will continue trying to control with diet and exercise.     Will also have patient completely discontinue use of any Valium, and dispose of this at the nearest pharmacy, as the patient is no longer needing to use this medication.  Return precautions provided, and patient handout on anxiety and food choices for diabetes.  Plan to follow up in 1 month to reassess, or for worsening or persistent symptoms despite treatment.  Patient Instructions  Lexapro once daily for . This will take several weeks to build up in your system before you can expect to see results, so don't get discouraged if you don't see immediate relief from anxiety.  Dispose of your Valium at a pharmacy since we won't be taking this anymore.  Continue trying  to manage your prediabetes with diet and exercise.  If emergency symptoms discussed during visit developed, seek medical attention immediately.  Followup in about 1 month to reassess, or for worsening or persistent symptoms despite treatment.

## 2014-01-23 ENCOUNTER — Ambulatory Visit: Payer: BC Managed Care – PPO | Admitting: Physician Assistant

## 2014-02-15 ENCOUNTER — Ambulatory Visit (INDEPENDENT_AMBULATORY_CARE_PROVIDER_SITE_OTHER): Payer: BC Managed Care – PPO | Admitting: Family Medicine

## 2014-02-15 ENCOUNTER — Encounter: Payer: Self-pay | Admitting: Family Medicine

## 2014-02-15 VITALS — BP 120/80 | HR 86 | Temp 97.9°F | Resp 16 | Wt 187.8 lb

## 2014-02-15 DIAGNOSIS — J328 Other chronic sinusitis: Secondary | ICD-10-CM

## 2014-02-15 MED ORDER — AMOXICILLIN 875 MG PO TABS: 875.0000 mg | ORAL_TABLET | Freq: Two times a day (BID) | ORAL | Status: DC

## 2014-02-15 MED ORDER — PROMETHAZINE-DM 6.25-15 MG/5ML PO SYRP: 5.0000 mL | ORAL_SOLUTION | Freq: Four times a day (QID) | ORAL | Status: DC | PRN

## 2014-02-15 NOTE — Progress Notes (Signed)
   Subjective:    Patient ID: Jon Hipp., male    DOB: November 05, 1960, 53 y.o.   MRN: [id number]  HPI URI- sxs started Monday.  Had similar sxs a few weeks ago 'that I fought off but then it got me'.  + HA, sinus pressure, 'heavy congestion', cough.  No fevers.  Hx of similar.  Has been out of work Tues-Thurs, had to leave early on Friday.  Mucinex w/ some relief.  Nasal spray.  Some dizziness.   Review of Systems For ROS see HPI     Objective:   Physical Exam  Vitals reviewed. Constitutional: He appears well-developed and well-nourished. No distress.  HENT:  Head: Normocephalic and atraumatic.  Right Ear: Tympanic membrane normal.  Left Ear: Tympanic membrane normal.  Nose: Mucosal edema and rhinorrhea present. Right sinus exhibits frontal sinus tenderness. Right sinus exhibits no maxillary sinus tenderness. Left sinus exhibits frontal sinus tenderness. Left sinus exhibits no maxillary sinus tenderness.  Mouth/Throat: Mucous membranes are normal. Oropharyngeal exudate and posterior oropharyngeal erythema present. No posterior oropharyngeal edema.  + PND  Eyes: Conjunctivae and EOM are normal. Pupils are equal, round, and reactive to light.  Neck: Normal range of motion. Neck supple.  Cardiovascular: Normal rate, regular rhythm and normal heart sounds.   Pulmonary/Chest: Effort normal and breath sounds normal. No respiratory distress. He has no wheezes.  + hacking cough  Lymphadenopathy:    He has no cervical adenopathy.  Skin: Skin is warm and dry.          Assessment & Plan:

## 2014-02-15 NOTE — Patient Instructions (Signed)
Follow up as needed Start the Amoxicillin twice daily- take w/ food Drink plenty of fluids Use the cough syrup as needed- will cause drowsiness Mucinex DM for daytime cough REST! Call with any questions or concerns Hang in there!!!

## 2014-02-15 NOTE — Progress Notes (Signed)
Pre visit review using our clinic review tool, if applicable. No additional management support is needed unless otherwise documented below in the visit note. 

## 2014-02-16 NOTE — Assessment & Plan Note (Signed)
Pt has hx of similar.  Pt's sxs- including 2nd sickening- and PE consistent w/ infxn.  Start abx.  Cough meds prn.  Reviewed supportive care and red flags that should prompt return.  Pt expressed understanding and is in agreement w/ plan.

## 2014-05-09 ENCOUNTER — Ambulatory Visit (INDEPENDENT_AMBULATORY_CARE_PROVIDER_SITE_OTHER): Payer: BLUE CROSS/BLUE SHIELD | Admitting: Internal Medicine

## 2014-05-09 ENCOUNTER — Encounter: Payer: Self-pay | Admitting: Internal Medicine

## 2014-05-09 VITALS — BP 120/79 | HR 87 | Temp 98.1°F | Ht 67.0 in | Wt 188.5 lb

## 2014-05-09 DIAGNOSIS — M545 Low back pain, unspecified: Secondary | ICD-10-CM

## 2014-05-09 DIAGNOSIS — N400 Enlarged prostate without lower urinary tract symptoms: Secondary | ICD-10-CM

## 2014-05-09 DIAGNOSIS — R350 Frequency of micturition: Secondary | ICD-10-CM

## 2014-05-09 DIAGNOSIS — K589 Irritable bowel syndrome without diarrhea: Secondary | ICD-10-CM

## 2014-05-09 DIAGNOSIS — F419 Anxiety disorder, unspecified: Secondary | ICD-10-CM

## 2014-05-09 LAB — POCT URINALYSIS DIPSTICK
Bilirubin, UA: NEGATIVE
GLUCOSE UA: NEGATIVE
Ketones, UA: NEGATIVE
Leukocytes, UA: NEGATIVE
NITRITE UA: NEGATIVE
PROTEIN UA: NEGATIVE
RBC UA: NEGATIVE
UROBILINOGEN UA: 0.2
pH, UA: 5.5

## 2014-05-09 MED ORDER — ALPRAZOLAM 1 MG PO TABS: ORAL_TABLET | ORAL | Status: DC

## 2014-05-09 MED ORDER — TAMSULOSIN HCL 0.4 MG PO CAPS: 0.4000 mg | ORAL_CAPSULE | Freq: Every day | ORAL | Status: DC

## 2014-05-09 NOTE — Patient Instructions (Signed)
Take a probiotic such as align OTC 1 tablet daily  Flonase or Nasacort  2 sprays  on each side of the nose daily Vaseline at the nostrils at night  Come back fasting for a physical in 4 months

## 2014-05-09 NOTE — Assessment & Plan Note (Signed)
history of IBS, predominantly w/  Loose stools, complaining of increase flatus  for 6-8 months. Recommend a trial with probiotics Primary GI is Dr. Olevia Perches

## 2014-05-09 NOTE — Assessment & Plan Note (Signed)
Was recently prescribed Lexapro, decided not to take it, didn't like the way he felt. On Xanax, needs a refill, will do a UDS on return to the office

## 2014-05-09 NOTE — Progress Notes (Signed)
Subjective:    Patient ID: Jon Hipp., male    DOB: [birth year], 54 y.o.   MRN: [id number]  DOS:  05/09/2014 Type of visit - description : new pt , tranferring from Dr Arnoldo Morale Interval history: Chart reviewed. Anxiety, on Xanax, did not tolerate Lexapro Chronic back pain, takes Vicodin occasionally. See assessment and plan Skin ca--about a year ago went to see dermatology, they bx a skin lesion @ scalp , ended up being BCC. History of BPH, complains of slow flow,   at night, slightly getting worse over the last few months, taking saw palmetto which helps. Denies problems with urinary flow during the daytime Also, the left nostril feels burning and dry, vaseline helps, has noted some postnasal dripping but no nosebleeds.   ROS Denies nausea, vomiting, diarrhea or blood in the stools. No dysuria or gross hematuria percent. Admits to some urinary frequency but mostly at night.  Past Medical History  Diagnosis Date  . BPH (benign prostatic hyperplasia)   . GERD   . HERPES SIMPLEX, UNCOMPLICATED   . LUMBAR RADICULOPATHY, LEFT   . Other and unspecified hyperlipidemia   . Other chronic sinusitis   . Other testicular hypofunction   . PANIC ATTACK, ACUTE   . Cervical pain (neck)   . Fatty liver   . Diverticulosis   . BCC (basal cell carcinoma of skin) 03-2013    scalp  . Irritable bowel syndrome 11/10/2006    Qualifier: Diagnosis of  By: Arnoldo Morale MD, Balinda Quails     Past Surgical History  Procedure Laterality Date  . Hernia repair      Inguinal  . Orchiectomy Right ~1995    benign  . Back surgery  2008  . Ganglion cyst excision      finger  . Colonoscopy    . Tonsillectomy    . Birth mark removal      as baby    History   Social History  . Marital Status: Single    Spouse Name: N/A    Number of Children: 0  . Years of Education: N/A   Occupational History  . furniture sales     Social History Main Topics  . Smoking status: Never Smoker   . Smokeless  tobacco: Never Used  . Alcohol Use: No  . Drug Use: No  . Sexual Activity: Yes   Other Topics Concern  . Not on file   Social History Narrative   Lives w/ partner Arville Go        Medication List       This list is accurate as of: 05/09/14 11:59 PM.  Always use your most recent med list.               ALPRAZolam 1 MG tablet  Commonly known as:  XANAX  TAKE 1/2 TO 1 TABLET BY MOUTH EVERY DAILY AS NEEDED .     GLUCOSAMINE 1500 COMPLEX PO  Take 1 tablet by mouth daily.     HYDROcodone-acetaminophen 10-325 MG per tablet  Commonly known as:  NORCO  Take 1 tablet by mouth every 6 (six) hours as needed.     NEXIUM 24HR PO  Take 1 tablet by mouth daily.     NIACINAMIDE PO  Take 750 mg by mouth daily.     RED YEAST RICE PO  Take 2,400 mg by mouth daily.     SAW PALMETTO PO  Take 900 mg by mouth daily.     SUPER  B COMPLEX PO  Take 2 tablets by mouth daily.     tamsulosin 0.4 MG Caps capsule  Commonly known as:  FLOMAX  Take 1 capsule (0.4 mg total) by mouth daily.     valACYclovir 500 MG tablet  Commonly known as:  VALTREX  Take 500 mg by mouth daily.     Vitamin D3 5000 UNITS Caps  Take 1 capsule by mouth daily.           Objective:   Physical Exam BP 120/79 mmHg  Pulse 87  Temp(Src) 98.1 F (36.7 C) (Oral)  Ht 5\' 7"  (1.702 m)  Wt 188 lb 8 oz (85.503 kg)  BMI 29.52 kg/m2  SpO2 95% General -- alert, well-developed, NAD.  HEENT--  Nostrils: Left normal, right with some erythema medially. Both with a small amount of dried discharge, white. Lungs -- normal respiratory effort, no intercostal retractions, no accessory muscle use, and normal breath sounds.  Heart-- normal rate, regular rhythm, no murmur.  Abdomen-- Not distended, good bowel sounds,soft, non-tender. Extremities-- no pretibial edema bilaterally  Neurologic--  alert & oriented X3. Speech normal, gait appropriate for age, strength symmetric and appropriate for age.    Psych-- Cognition  and judgment appear intact. Cooperative with normal attention span and concentration. No anxious or depressed appearing.      Assessment & Plan:    Partner has HIV, they are sexually active, wonders if he needs to be taking any preventive medication. Will discuss with ID, safe sex encourage.

## 2014-05-09 NOTE — Assessment & Plan Note (Addendum)
Chronic back pain, status post surgery, take occasionally Vicodin. Denies rash but sometimes he itches with Vicodin, itching  well-controlled with concomitant use of Benadryl

## 2014-05-09 NOTE — Progress Notes (Signed)
Pre visit review using our clinic review tool, if applicable. No additional management support is needed unless otherwise documented below in the visit note. 

## 2014-05-09 NOTE — Assessment & Plan Note (Addendum)
Difficulty urinating at night only. DRE and PSA normal 07-2013 udip negative today Plan: UA, urine culture, trial with Flomax

## 2014-05-10 LAB — URINALYSIS, ROUTINE W REFLEX MICROSCOPIC
BILIRUBIN URINE: NEGATIVE
GLUCOSE, UA: NEGATIVE mg/dL
HGB URINE DIPSTICK: NEGATIVE
Ketones, ur: NEGATIVE mg/dL
LEUKOCYTES UA: NEGATIVE
Nitrite: NEGATIVE
PH: 5.5 (ref 5.0–8.0)
PROTEIN: NEGATIVE mg/dL
Specific Gravity, Urine: 1.025 (ref 1.005–1.030)
UROBILINOGEN UA: 0.2 mg/dL (ref 0.0–1.0)

## 2014-05-11 LAB — URINE CULTURE
Colony Count: NO GROWTH
ORGANISM ID, BACTERIA: NO GROWTH

## 2014-06-17 ENCOUNTER — Encounter: Payer: Self-pay | Admitting: Internal Medicine

## 2014-06-17 ENCOUNTER — Ambulatory Visit (INDEPENDENT_AMBULATORY_CARE_PROVIDER_SITE_OTHER): Payer: BLUE CROSS/BLUE SHIELD | Admitting: Internal Medicine

## 2014-06-17 VITALS — BP 143/88 | HR 54 | Temp 97.7°F | Wt 190.0 lb

## 2014-06-17 DIAGNOSIS — Z7253 High risk bisexual behavior: Secondary | ICD-10-CM | POA: Diagnosis not present

## 2014-06-17 DIAGNOSIS — Z7252 High risk homosexual behavior: Secondary | ICD-10-CM | POA: Diagnosis not present

## 2014-06-17 DIAGNOSIS — Z7251 High risk heterosexual behavior: Secondary | ICD-10-CM

## 2014-06-17 NOTE — Progress Notes (Signed)
Patient ID: Jon Hipp., male   DOB: May 15, 1960, 54 y.o.   MRN: [id number]       Patient ID: Jon Mihalik Silvio Pate., male   DOB: December 26, 1960, 54 y.o.   MRN: [id number]  HPI Jon Richardson is a 54yo M who is in a committed, long term relationship with HIV infected partner. They are a discordant couple. His partner is one of my patients who has had well controlled HIV disease for 3 years. Jon Richardson comes to clinic after hearing about PrEP and wondering if he would be a candidate for it. They do not have any other partners in their relationship. They have been together for 14 years. No hx of STI in their relationship. They use condoms when they have sex, which is not often.  Outpatient Encounter Prescriptions as of 06/17/2014  Medication Sig  . ALPRAZolam (XANAX) 1 MG tablet TAKE 1/2 TO 1 TABLET BY MOUTH EVERY DAILY AS NEEDED .  . B Complex-C (SUPER B COMPLEX PO) Take 2 tablets by mouth daily.  . Cholecalciferol (VITAMIN D3) 5000 UNITS CAPS Take 1 capsule by mouth daily.  . Esomeprazole Magnesium (NEXIUM 24HR PO) Take 1 tablet by mouth daily.   . Glucosamine-Chondroit-Vit C-Mn (GLUCOSAMINE 1500 COMPLEX PO) Take 1 tablet by mouth daily.  Marland Kitchen HYDROcodone-acetaminophen (NORCO) 10-325 MG per tablet Take 1 tablet by mouth every 6 (six) hours as needed.  Marland Kitchen NIACINAMIDE PO Take 750 mg by mouth daily.  . Red Yeast Rice Extract (RED YEAST RICE PO) Take 2,400 mg by mouth daily.  . Saw Palmetto, Serenoa repens, (SAW PALMETTO PO) Take 900 mg by mouth daily.  . tamsulosin (FLOMAX) 0.4 MG CAPS capsule Take 1 capsule (0.4 mg total) by mouth daily.  . valACYclovir (VALTREX) 500 MG tablet Take 500 mg by mouth daily.     Patient Active Problem List   Diagnosis Date Noted  . Cervical disc disorder with radiculopathy of cervical region 08/29/2012  . 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 MYALGIA 07/07/2009  . BPH (benign prostatic hyperplasia) 07/22/2008  . Back pain-chronic-status post surgery 07/22/2008  . GERD  11/12/2007  . Anxiety- h/o panic attack 07/11/2007  . PERSISTENT DISORDER INITIATING/MAINTAINING SLEEP 05/30/2007  . OTHER CHRONIC SINUSITIS 05/30/2007  . Other Testicular Hypofunction 11/22/2006  . Other and Unspecified Hyperlipidemia 11/22/2006  . HERPES SIMPLEX, UNCOMPLICATED 27/74/1287  . IRRITABLE BOWEL SYNDROME 11/10/2006     Health Maintenance Due  Topic Date Due  . INFLUENZA VACCINE  11/23/2013     Review of Systems  Constitutional: Negative for fever, chills, diaphoresis, activity change, appetite change, fatigue and unexpected weight change.  HENT: Negative for congestion, sore throat, rhinorrhea, sneezing, trouble swallowing and sinus pressure.  Eyes: Negative for photophobia and visual disturbance.  Respiratory: Negative for cough, chest tightness, shortness of breath, wheezing and stridor.  Cardiovascular: Negative for chest pain, palpitations and leg swelling.  Gastrointestinal: Negative for nausea, vomiting, abdominal pain, diarrhea, constipation, blood in stool, abdominal distention and anal bleeding.  Genitourinary: Negative for dysuria, hematuria, flank pain and difficulty urinating.  Musculoskeletal: Negative for myalgias, back pain, joint swelling, arthralgias and gait problem.  Skin: Negative for color change, pallor, rash and wound.  Neurological: Negative for dizziness, tremors, weakness and light-headedness.  Hematological: Negative for adenopathy. Does not bruise/bleed easily.  Psychiatric/Behavioral: Negative for behavioral problems, confusion, sleep disturbance, dysphoric mood, decreased concentration and agitation.    Physical Exam   BP 143/88 mmHg  Pulse 54  Temp(Src) 97.7 F (36.5 C) (Oral)  Wt 190  lb (86.183 kg) Physical Exam  Constitutional: He is oriented to person, place, and time. He appears well-developed and well-nourished. No distress.  HENT:  Mouth/Throat: Oropharynx is clear and moist. No oropharyngeal exudate.  Skin: Skin is warm and  dry. No rash noted. No erythema.  Psychiatric: He has a normal mood and affect. His behavior is normal.    CBC Lab Results  Component Value Date   WBC 7.7 07/24/2013   RBC 4.86 07/24/2013   HGB 14.8 07/24/2013   HCT 44.3 07/24/2013   PLT 322.0 07/24/2013   MCV 91.3 07/24/2013   MCHC 33.3 07/24/2013   RDW 13.6 07/24/2013   LYMPHSABS 2.8 07/24/2013   MONOABS 0.6 07/24/2013   EOSABS 0.2 07/24/2013   BASOSABS 0.1 07/24/2013   BMET Lab Results  Component Value Date   NA 140 07/24/2013   K 5.1 07/24/2013   CL 104 07/24/2013   CO2 28 07/24/2013   GLUCOSE 112* 07/24/2013   BUN 18 07/24/2013   CREATININE 1.1 07/24/2013   CALCIUM 10.0 07/24/2013   GFRNONAA 62.21 03/12/2010   GFRAA 93 11/15/2006    Assessment and Plan  Spent 25 min with patient, face to face time. discussing PrEP, studies supporting its use for people at risk of acquiring hiv disease. Currently, Jon Richardson is in long term relationship with HIV infected person who is well controlled. They have protected sex, but do not have any other partners/or open relationship.  Since patient is not interested in taking medication daily, and also not really risk for getting HIV disease due to current sexual practice, and no other partners, and his current partner being well controlled. Recommend to come back to clinic if he would like to start PrEP or if there relationship changes.  [facility] [phone]

## 2014-07-15 ENCOUNTER — Encounter: Payer: Self-pay | Admitting: Physician Assistant

## 2014-07-15 ENCOUNTER — Ambulatory Visit (INDEPENDENT_AMBULATORY_CARE_PROVIDER_SITE_OTHER): Payer: BLUE CROSS/BLUE SHIELD | Admitting: Physician Assistant

## 2014-07-15 VITALS — BP 111/80 | HR 70 | Temp 98.5°F | Resp 16 | Ht 67.0 in | Wt 187.0 lb

## 2014-07-15 DIAGNOSIS — K219 Gastro-esophageal reflux disease without esophagitis: Secondary | ICD-10-CM

## 2014-07-15 LAB — H. PYLORI ANTIBODY, IGG: H PYLORI IGG: NEGATIVE

## 2014-07-15 MED ORDER — PANTOPRAZOLE SODIUM 40 MG PO TBEC: 40.0000 mg | DELAYED_RELEASE_TABLET | Freq: Every day | ORAL | Status: DC

## 2014-07-15 NOTE — Progress Notes (Signed)
Patient presents to clinic today c/o continued acid reflux with globus and mild intermittent epigastric discomfort and nausea.  Patient previously on Nexium and endorses worsening of symptoms while on medication. Denies NSAID use or history of GI bleed. Denies tenesmus, melena or hematochezia.  Past Medical History  Diagnosis Date  . BPH (benign prostatic hyperplasia)   . GERD   . HERPES SIMPLEX, UNCOMPLICATED   . LUMBAR RADICULOPATHY, LEFT   . Other and unspecified hyperlipidemia   . Other chronic sinusitis   . Other testicular hypofunction   . PANIC ATTACK, ACUTE   . Cervical pain (neck)   . Fatty liver   . Diverticulosis   . BCC (basal cell carcinoma of skin) 03-2013    scalp  . Irritable bowel syndrome (IBS)     WITH DIARRHEA    Current Outpatient Prescriptions on File Prior to Visit  Medication Sig Dispense Refill  . ALPRAZolam (XANAX) 1 MG tablet TAKE 1/2 TO 1 TABLET BY MOUTH EVERY DAILY AS NEEDED . 30 tablet 3  . B Complex-C (SUPER B COMPLEX PO) Take 2 tablets by mouth daily.    . Cholecalciferol (VITAMIN D3) 5000 UNITS CAPS Take 1 capsule by mouth daily.    . Glucosamine-Chondroit-Vit C-Mn (GLUCOSAMINE 1500 COMPLEX PO) Take 1 tablet by mouth daily.    Marland Kitchen HYDROcodone-acetaminophen (NORCO) 10-325 MG per tablet Take 1 tablet by mouth every 6 (six) hours as needed. 30 tablet 0  . NIACINAMIDE PO Take 750 mg by mouth daily.    . Red Yeast Rice Extract (RED YEAST RICE PO) Take 2,400 mg by mouth daily.    . Saw Palmetto, Serenoa repens, (SAW PALMETTO PO) Take 900 mg by mouth daily.    . valACYclovir (VALTREX) 500 MG tablet Take 500 mg by mouth daily.     No current facility-administered medications on file prior to visit.    Allergies  Allergen Reactions  . Erythromycin Diarrhea    abd cramps    Family History  Problem Relation Age of Onset  . Heart disease Mother   . Prostate cancer Father   . Cancer Father     lung cancer  . Lung cancer Father   . Colon cancer  Paternal Grandmother   . Heart attack Paternal Grandfather   . Esophageal cancer Neg Hx   . Rectal cancer Neg Hx   . Stomach cancer Neg Hx   . Prostate cancer Paternal Uncle   . Heart disease Maternal Grandmother   . Cancer Maternal Uncle     prostate cancer    History   Social History  . Marital Status: Single    Spouse Name: N/A  . Number of Children: 0  . Years of Education: N/A   Occupational History  . furniture sales     Social History Main Topics  . Smoking status: Never Smoker   . Smokeless tobacco: Never Used  . Alcohol Use: No  . Drug Use: No  . Sexual Activity: Yes   Other Topics Concern  . None   Social History Narrative   Lives w/ partner Arville Go    Review of Systems - See HPI.  All other ROS are negative.  BP 111/80 mmHg  Pulse 70  Temp(Src) 98.5 F (36.9 C) (Oral)  Resp 16  Ht 5\' 7"  (1.702 m)  Wt 187 lb (84.823 kg)  BMI 29.28 kg/m2  SpO2 97%  Physical Exam  Constitutional: He is oriented to person, place, and time and well-developed, well-nourished, and  in no distress.  HENT:  Head: Normocephalic and atraumatic.  Cardiovascular: Normal rate, regular rhythm, normal heart sounds and intact distal pulses.   Pulmonary/Chest: Effort normal and breath sounds normal. No respiratory distress. He has no wheezes. He has no rales. He exhibits no tenderness.  Abdominal: Soft. Bowel sounds are normal. He exhibits no distension and no mass. There is no tenderness. There is no rebound and no guarding.  Neurological: He is alert and oriented to person, place, and time.  Skin: Skin is warm and dry. No rash noted.  Psychiatric: Affect normal.  Vitals reviewed.   Recent Results (from the past 2160 hour(s))  POCT Urinalysis Dipstick     Status: None   Collection Time: 05/09/14  4:22 PM  Result Value Ref Range   Color, UA Yellow    Clarity, UA Clear    Glucose, UA Negative    Bilirubin, UA Negative    Ketones, UA Negative    Spec Grav, UA >=1.030     Blood, UA Negative    pH, UA 5.5    Protein, UA Negative    Urobilinogen, UA 0.2    Nitrite, UA Negative    Leukocytes, UA Negative   Urine Culture     Status: None   Collection Time: 05/09/14  5:20 PM  Result Value Ref Range   Colony Count NO GROWTH    Organism ID, Bacteria NO GROWTH   Urinalysis, Routine w reflex microscopic     Status: None   Collection Time: 05/09/14  5:22 PM  Result Value Ref Range   Color, Urine YELLOW YELLOW   APPearance CLEAR CLEAR   Specific Gravity, Urine 1.025 1.005 - 1.030   pH 5.5 5.0 - 8.0   Glucose, UA NEG NEG mg/dL   Bilirubin Urine NEG NEG   Ketones, ur NEG NEG mg/dL   Hgb urine dipstick NEG NEG   Protein, ur NEG NEG mg/dL   Urobilinogen, UA 0.2 0.0 - 1.0 mg/dL   Nitrite NEG NEG   Leukocytes, UA NEG NEG  H. pylori antibody, IgG     Status: None   Collection Time: 07/15/14  3:20 PM  Result Value Ref Range   H Pylori IgG Negative Negative    Assessment/Plan: GERD Will begin Protonix daily.  Nighttime Zantac.  GERD diet discussed.  Handout given.  Will do lab workup today to assess for H. Pylori.  Will treat if positive.  Follow-up in 2 weeks.

## 2014-07-15 NOTE — Assessment & Plan Note (Signed)
Will begin Protonix daily.  Nighttime Zantac.  GERD diet discussed.  Handout given.  Will do lab workup today to assess for H. Pylori.  Will treat if positive.  Follow-up in 2 weeks.

## 2014-07-15 NOTE — Progress Notes (Signed)
Pre visit review using our clinic review tool, if applicable. No additional management support is needed unless otherwise documented below in the visit note/SLS  

## 2014-07-15 NOTE — Patient Instructions (Signed)
Please stop by the lab for blood work. I will call you with your results. Take the Protonix daily. Continue a bedtime Zantac.  We will alter your regimen based on results.  Food Choices for Gastroesophageal Reflux Disease When you have gastroesophageal reflux disease (GERD), the foods you eat and your eating habits are very important. Choosing the right foods can help ease the discomfort of GERD. WHAT GENERAL GUIDELINES DO I NEED TO FOLLOW?  Choose fruits, vegetables, whole grains, low-fat dairy products, and low-fat meat, fish, and poultry.  Limit fats such as oils, salad dressings, butter, nuts, and avocado.  Keep a food diary to identify foods that cause symptoms.  Avoid foods that cause reflux. These may be different for different people.  Eat frequent small meals instead of three large meals each day.  Eat your meals slowly, in a relaxed setting.  Limit fried foods.  Cook foods using methods other than frying.  Avoid drinking alcohol.  Avoid drinking large amounts of liquids with your meals.  Avoid bending over or lying down until 2-3 hours after eating. WHAT FOODS ARE NOT RECOMMENDED? The following are some foods and drinks that may worsen your symptoms: Vegetables Tomatoes. Tomato juice. Tomato and spaghetti sauce. Chili peppers. Onion and garlic. Horseradish. Fruits Oranges, grapefruit, and lemon (fruit and juice). Meats High-fat meats, fish, and poultry. This includes hot dogs, ribs, ham, sausage, salami, and bacon. Dairy Whole milk and chocolate milk. Sour cream. Cream. Butter. Ice cream. Cream cheese.  Beverages Coffee and tea, with or without caffeine. Carbonated beverages or energy drinks. Condiments Hot sauce. Barbecue sauce.  Sweets/Desserts Chocolate and cocoa. Donuts. Peppermint and spearmint. Fats and Oils High-fat foods, including [place] fries and potato chips. Other Vinegar. Strong spices, such as black pepper, white pepper, red pepper, cayenne,  curry powder, cloves, ginger, and chili powder. The items listed above may not be a complete list of foods and beverages to avoid. Contact your dietitian for more information. Document Released: 04/11/2005 Document Revised: 04/16/2013 Document Reviewed: 02/13/2013 [facility] Patient Information 2015 [place], [place]. This information is not intended to replace advice given to you by your health care provider. Make sure you discuss any questions you have with your health care provider.

## 2014-07-30 ENCOUNTER — Other Ambulatory Visit: Payer: Self-pay

## 2014-08-13 ENCOUNTER — Telehealth: Payer: Self-pay | Admitting: Internal Medicine

## 2014-08-13 NOTE — Telephone Encounter (Signed)
Pre Visit letter sent  °

## 2014-09-03 ENCOUNTER — Telehealth: Payer: Self-pay | Admitting: *Deleted

## 2014-09-03 ENCOUNTER — Encounter: Payer: Self-pay | Admitting: *Deleted

## 2014-09-03 NOTE — Telephone Encounter (Signed)
Pre-Visit Call completed with patient and chart updated.   Pre-Visit Info documented in Specialty Comments under [employer].    

## 2014-09-04 ENCOUNTER — Encounter: Payer: Self-pay | Admitting: Internal Medicine

## 2014-09-04 ENCOUNTER — Ambulatory Visit (INDEPENDENT_AMBULATORY_CARE_PROVIDER_SITE_OTHER): Payer: BLUE CROSS/BLUE SHIELD | Admitting: Internal Medicine

## 2014-09-04 VITALS — BP 124/68 | HR 51 | Temp 98.1°F | Ht 67.0 in | Wt 188.2 lb

## 2014-09-04 DIAGNOSIS — K219 Gastro-esophageal reflux disease without esophagitis: Secondary | ICD-10-CM

## 2014-09-04 DIAGNOSIS — F419 Anxiety disorder, unspecified: Secondary | ICD-10-CM

## 2014-09-04 DIAGNOSIS — Z0000 Encounter for general adult medical examination without abnormal findings: Secondary | ICD-10-CM | POA: Diagnosis not present

## 2014-09-04 DIAGNOSIS — M5010 Cervical disc disorder with radiculopathy, unspecified cervical region: Secondary | ICD-10-CM

## 2014-09-04 MED ORDER — FLUOXETINE HCL 20 MG PO TABS: 20.0000 mg | ORAL_TABLET | Freq: Every day | ORAL | Status: DC

## 2014-09-04 MED ORDER — PANTOPRAZOLE SODIUM 40 MG PO TBEC: 40.0000 mg | DELAYED_RELEASE_TABLET | Freq: Every day | ORAL | Status: DC

## 2014-09-04 NOTE — Assessment & Plan Note (Signed)
Tdap-- 06/01/08    CCS: 01/11/13 with Delfin Edis, MD - diverticulosis   prostate cancer screening, most recently a negative, PSA stable for years, reassess next year. (addendum, check a psa per pt request)  Diet and exercise discussed  Labs

## 2014-09-04 NOTE — Progress Notes (Signed)
Pre visit review using our clinic review tool, if applicable. No additional management support is needed unless otherwise documented below in the visit note. 

## 2014-09-04 NOTE — Patient Instructions (Signed)
Go to the lab today  for a UDS (urine sample)    Please schedule labs to be done within few days (fasting)  Start fluoxetine 20 mg: Half tablet daily for 2 weeks, then 1 tablet daily. Continue with Xanax as needed    Come back to the office in 2 months   for a routine check up

## 2014-09-04 NOTE — Assessment & Plan Note (Signed)
Anxiety, described as getting frustrated easily, currently on Xanax with help only temporarily. Previously Lexapro make him feel "weird". At this point symptoms are more persistent than episodic I think a trial with another SSRI is indicated. Plan: Trial with Prozac, continue with Xanax, if not better consider switch to Effexor or similar SNRI, also consider a  trial with clonazepam which is longer acting Xanax. Continue psychotherapy with Dr. Mare Ferrari

## 2014-09-04 NOTE — Assessment & Plan Note (Signed)
Complain of bilateral left arm weakness, for the last 2 years, neurological exam normal, reports that he went to [place] 2 years ago and nerve conduction study was normal. Recommend observation.

## 2014-09-04 NOTE — Progress Notes (Signed)
Subjective:    Patient ID: Jon Hipp., male    DOB: 1960-10-04, 54 y.o.   MRN: [id number]  DOS:  09/04/2014 Type of visit - description : cpx Interval history: Has several issues he need to talk about, see review of systems   Review of Systems Constitutional: No fever, chills. No unexplained wt changes. No unusual sweats HEENT: No dental problems, ear discharge, facial swelling, voice changes. No eye discharge, redness or intolerance to light Respiratory: No wheezing or difficulty breathing. No cough , mucus production Cardiovascular: No CP, leg swelling or palpitations GI: no nausea, vomiting, diarrhea or abdominal pain.  No blood in the stools. No dysphagia . GERD symptoms well controlled as long as he takes his medication Endocrine: No polyphagia, polyuria or polydipsia GU: No dysuria, gross hematuria, difficulty urinating. No urinary urgency or frequency. Musculoskeletal: No joint swellings  Random pain at the left knee, sharp, last few seconds. History of neck pain, currently not an issue, has not taking painkillers in a while Skin: No change in the color of the skin, palor or rash Allergic, immunologic: No environmental allergies or food allergies Neurological: No dizziness or syncope. No headaches. No diplopia, slurred speech, motor deficits, facial numbness Hematological: No enlarged lymph nodes, easy bruising or bleeding Psychiatry: No suicidal ideas, hallucinations, behavior problems or confusion. History of anxiety described as "frustration, little things bother me, angry at times". Not classic feeling nervous or anxious. Denies depression.     Past Medical History  Diagnosis Date  . BPH (benign prostatic hyperplasia)   . GERD   . HERPES SIMPLEX, UNCOMPLICATED   . LUMBAR RADICULOPATHY, LEFT   . Other and unspecified hyperlipidemia   . Other chronic sinusitis   . Other testicular hypofunction   . PANIC ATTACK, ACUTE   . Cervical pain (neck)   .  Fatty liver   . Diverticulosis   . BCC (basal cell carcinoma of skin) 03-2013    scalp  . Irritable bowel syndrome (IBS)     WITH DIARRHEA    Past Surgical History  Procedure Laterality Date  . Hernia repair      Inguinal  . Orchiectomy Right ~1995    benign  . Back surgery  2008  . Ganglion cyst excision      finger  . Colonoscopy    . Tonsillectomy    . Birth mark removal      as baby    History   Social History  . Marital Status: Single    Spouse Name: N/A  . Number of Children: 0  . Years of Education: N/A   Occupational History  . furniture sales     Social History Main Topics  . Smoking status: Never Smoker   . Smokeless tobacco: Never Used  . Alcohol Use: 0.0 oz/week    0 Standard drinks or equivalent per week  . Drug Use: No  . Sexual Activity: Yes   Other Topics Concern  . Not on file   Social History Narrative   Lives w/ partner Arville Go     Family History  Problem Relation Age of Onset  . Heart disease Mother   . Prostate cancer Father   . Lung cancer Father   . Colon cancer Paternal Grandmother   . Heart attack Paternal Grandfather   . Esophageal cancer Neg Hx   . Rectal cancer Neg Hx   . Stomach cancer Neg Hx   . Prostate cancer Paternal Uncle   . Heart  disease Maternal Grandmother   . Cancer Maternal Uncle     prostate cancer       Medication List       This list is accurate as of: 09/04/14  5:46 PM.  Always use your most recent med list.               ALPRAZolam 1 MG tablet  Commonly known as:  XANAX  TAKE 1/2 TO 1 TABLET BY MOUTH EVERY DAILY AS NEEDED .     cimetidine 200 MG tablet  Commonly known as:  TAGAMET  Take 200 mg by mouth 2 (two) times daily.     FLUoxetine 20 MG tablet  Commonly known as:  PROZAC  Take 1 tablet (20 mg total) by mouth daily.     GLUCOSAMINE 1500 COMPLEX PO  Take 1 tablet by mouth daily.     HYDROcodone-acetaminophen 10-325 MG per tablet  Commonly known as:  NORCO  Take 1 tablet by  mouth every 6 (six) hours as needed.     NIACINAMIDE PO  Take 750 mg by mouth daily.     pantoprazole 40 MG tablet  Commonly known as:  PROTONIX  Take 1 tablet (40 mg total) by mouth daily.     RED YEAST RICE PO  Take 2,400 mg by mouth daily.     SAW PALMETTO PO  Take 900 mg by mouth daily.     SUPER B COMPLEX PO  Take 2 tablets by mouth daily.     valACYclovir 500 MG tablet  Commonly known as:  VALTREX  Take 500 mg by mouth daily.     Vitamin D3 5000 UNITS Caps  Take 1 capsule by mouth daily.           Objective:   Physical Exam BP 124/68 mmHg  Pulse 51  Temp(Src) 98.1 F (36.7 C) (Oral)  Ht 5\' 7"  (1.702 m)  Wt 188 lb 4 oz (85.39 kg)  BMI 29.48 kg/m2  SpO2 97% General:   Well developed, well nourished . NAD.  HEENT:  Normocephalic . Face symmetric, atraumatic Lungs:  CTA B Normal respiratory effort, no intercostal retractions, no accessory muscle use. Heart: RRR,  no murmur.  no pretibial edema bilaterally  Abdomen:  Not distended, soft, non-tender. No rebound or rigidity. No mass,organomegaly Skin: Not pale. Not jaundice Neurologic:  alert & oriented X3.  Speech normal, gait appropriate for age and unassisted DTRs symmetric, motor symmetric Psych--  Cognition and judgment appear intact.  Cooperative with normal attention span and concentration.  Behavior appropriate. No anxious or depressed appearing.       Assessment & Plan:     GERD, symptoms well-controlled

## 2014-09-05 ENCOUNTER — Other Ambulatory Visit (INDEPENDENT_AMBULATORY_CARE_PROVIDER_SITE_OTHER): Payer: BLUE CROSS/BLUE SHIELD

## 2014-09-05 DIAGNOSIS — R7989 Other specified abnormal findings of blood chemistry: Secondary | ICD-10-CM | POA: Diagnosis not present

## 2014-09-05 DIAGNOSIS — Z0000 Encounter for general adult medical examination without abnormal findings: Secondary | ICD-10-CM

## 2014-09-05 LAB — LIPID PANEL
CHOL/HDL RATIO: 6
Cholesterol: 246 mg/dL — ABNORMAL HIGH (ref 0–200)
HDL: 41.2 mg/dL (ref >39.00)
NonHDL: 204.8
Triglycerides: 323 mg/dL — ABNORMAL HIGH (ref 0.0–149.0)
VLDL: 64.6 mg/dL — AB (ref 0.0–40.0)

## 2014-09-05 LAB — BASIC METABOLIC PANEL
BUN: 16 mg/dL (ref 6–23)
CHLORIDE: 103 meq/L (ref 96–112)
CO2: 29 meq/L (ref 19–32)
CREATININE: 1.17 mg/dL (ref 0.40–1.50)
Calcium: 10 mg/dL (ref 8.4–10.5)
GFR: 69.03 mL/min (ref >60.00)
Glucose, Bld: 111 mg/dL — ABNORMAL HIGH (ref 70–99)
Potassium: 5.3 mEq/L — ABNORMAL HIGH (ref 3.5–5.1)
Sodium: 139 mEq/L (ref 135–145)

## 2014-09-05 LAB — TSH: TSH: 3.47 u[IU]/mL (ref 0.35–4.50)

## 2014-09-05 LAB — LDL CHOLESTEROL, DIRECT: Direct LDL: 121 mg/dL

## 2014-09-05 LAB — PSA: PSA: 0.75 ng/mL (ref 0.10–4.00)

## 2014-09-05 LAB — HEMOGLOBIN A1C: HEMOGLOBIN A1C: 6 % (ref 4.6–6.5)

## 2014-09-12 ENCOUNTER — Telehealth: Payer: Self-pay

## 2014-09-12 NOTE — Telephone Encounter (Signed)
UDS: 09/04/2014  Negative for Alprazolam: PRN   Low risk per Dr. Larose Kells 09/12/2014

## 2014-10-01 ENCOUNTER — Telehealth: Payer: Self-pay

## 2014-10-01 MED ORDER — VALACYCLOVIR HCL 500 MG PO TABS: 500.0000 mg | ORAL_TABLET | Freq: Every day | ORAL | Status: DC

## 2014-10-01 NOTE — Telephone Encounter (Signed)
Ok 30 and 5 

## 2014-10-01 NOTE — Telephone Encounter (Signed)
Rx sent to [facility].  

## 2014-10-01 NOTE — Telephone Encounter (Signed)
Pt is requesting refill on Valtrex 500 mg tabs.   Last OV: 09/04/2014 Last Fill: 08/03/2014 #30 5RF Last Filled by Dr. Arnoldo Morale   Please advise.

## 2014-11-04 ENCOUNTER — Ambulatory Visit (INDEPENDENT_AMBULATORY_CARE_PROVIDER_SITE_OTHER): Payer: BLUE CROSS/BLUE SHIELD | Admitting: Internal Medicine

## 2014-11-04 ENCOUNTER — Encounter: Payer: Self-pay | Admitting: Internal Medicine

## 2014-11-04 VITALS — BP 118/64 | HR 60 | Temp 98.0°F | Ht 67.0 in | Wt 187.1 lb

## 2014-11-04 DIAGNOSIS — E875 Hyperkalemia: Secondary | ICD-10-CM

## 2014-11-04 DIAGNOSIS — K589 Irritable bowel syndrome without diarrhea: Secondary | ICD-10-CM

## 2014-11-04 DIAGNOSIS — E119 Type 2 diabetes mellitus without complications: Secondary | ICD-10-CM | POA: Insufficient documentation

## 2014-11-04 DIAGNOSIS — R7309 Other abnormal glucose: Secondary | ICD-10-CM | POA: Diagnosis not present

## 2014-11-04 DIAGNOSIS — F419 Anxiety disorder, unspecified: Secondary | ICD-10-CM

## 2014-11-04 DIAGNOSIS — R7303 Prediabetes: Secondary | ICD-10-CM

## 2014-11-04 DIAGNOSIS — N400 Enlarged prostate without lower urinary tract symptoms: Secondary | ICD-10-CM

## 2014-11-04 MED ORDER — TAMSULOSIN HCL 0.4 MG PO CAPS: 0.4000 mg | ORAL_CAPSULE | Freq: Every day | ORAL | Status: DC

## 2014-11-04 NOTE — Assessment & Plan Note (Signed)
Anxiety: Tried Prozac, self discontinued because he felt "weird" currently doing okay, denies any of any other medication except Xanax as needed

## 2014-11-04 NOTE — Progress Notes (Signed)
Subjective:    Patient ID: Jon Ee., male    DOB: 05-19-1960, 54 y.o.   MRN: [id number]  DOS:  11/04/2014 Type of visit - description : Routine visit Interval history: Likes to discuss all his previous labs --> elevated K+, TG, A1C Anxiety, tried fluoxetine and felt weird, self discontinued it. Currently doing okay IBS, has on and off gas and mild lower abdominal discomfort, wonders if he has episodes of diverticulitis. No fever chills or left-sided sharp pain.    Review of Systems   Past Medical History  Diagnosis Date  . BPH (benign prostatic hyperplasia)   . GERD   . HERPES SIMPLEX, UNCOMPLICATED   . LUMBAR RADICULOPATHY, LEFT   . Other and unspecified hyperlipidemia   . Other chronic sinusitis   . Other testicular hypofunction   . PANIC ATTACK, ACUTE   . Cervical pain (neck)   . Fatty liver   . Diverticulosis   . BCC (basal cell carcinoma of skin) 03-2013    scalp  . Irritable bowel syndrome (IBS)     WITH DIARRHEA    Past Surgical History  Procedure Laterality Date  . Hernia repair      Inguinal  . Orchiectomy Right ~1995    benign  . Back surgery  2008  . Ganglion cyst excision      finger  . Colonoscopy    . Tonsillectomy    . Birth mark removal      as baby    History   Social History  . Marital Status: Single    Spouse Name: N/A  . Number of Children: 0  . Years of Education: N/A   Occupational History  . furniture sales     Social History Main Topics  . Smoking status: Never Smoker   . Smokeless tobacco: Never Used  . Alcohol Use: 0.0 oz/week    0 Standard drinks or equivalent per week  . Drug Use: No  . Sexual Activity: Yes   Other Topics Concern  . Not on file   Social History Narrative   Lives w/ partner Arville Go        Medication List       This list is accurate as of: 11/04/14  4:59 PM.  Always use your most recent med list.               ALPRAZolam 1 MG tablet  Commonly known as:  XANAX  TAKE 1/2  TO 1 TABLET BY MOUTH EVERY DAILY AS NEEDED .     cimetidine 200 MG tablet  Commonly known as:  TAGAMET  Take 200 mg by mouth 2 (two) times daily.     GLUCOSAMINE 1500 COMPLEX PO  Take 1 tablet by mouth daily.     HYDROcodone-acetaminophen 10-325 MG per tablet  Commonly known as:  NORCO  Take 1 tablet by mouth every 6 (six) hours as needed.     NIACINAMIDE PO  Take 750 mg by mouth daily.     pantoprazole 40 MG tablet  Commonly known as:  PROTONIX  Take 1 tablet (40 mg total) by mouth daily.     RED YEAST RICE PO  Take 2,400 mg by mouth daily.     SAW PALMETTO PO  Take 900 mg by mouth daily.     SUPER B COMPLEX PO  Take 2 tablets by mouth daily.     tamsulosin 0.4 MG Caps capsule  Commonly known as:  FLOMAX  Take 1 capsule (  0.4 mg total) by mouth daily.     valACYclovir 500 MG tablet  Commonly known as:  VALTREX  Take 1 tablet (500 mg total) by mouth daily.     Vitamin D3 5000 UNITS Caps  Take 1 capsule by mouth daily.           Objective:   Physical Exam BP 118/64 mmHg  Pulse 60  Temp(Src) 98 F (36.7 C) (Oral)  Ht 5\' 7"  (1.702 m)  Wt 187 lb 2 oz (84.879 kg)  BMI 29.30 kg/m2  SpO2 97% General:   Well developed, well nourished . NAD.  HEENT:  Normocephalic . Face symmetric, atraumatic  Skin: Not pale. Not jaundice Neurologic:  alert & oriented X3.  Speech normal, gait appropriate for age and unassisted Psych--  Cognition and judgment appear intact.  Cooperative with normal attention span and concentration.  Behavior appropriate. No anxious or depressed appearing.       Assessment & Plan:    Mild hyperkalemia, check a BMP  BPH: Having nocturia on and off, would like to have a prescription for Flomax

## 2014-11-04 NOTE — Assessment & Plan Note (Signed)
IBS, Diarrhea predominantly and "gas", previously probiotics increased symptoms. Recommend Metamucil daily

## 2014-11-04 NOTE — Progress Notes (Signed)
Pre visit review using our clinic review tool, if applicable. No additional management support is needed unless otherwise documented below in the visit note. 

## 2014-11-04 NOTE — Patient Instructions (Signed)
Get your blood work before you leave    

## 2014-11-05 ENCOUNTER — Other Ambulatory Visit: Payer: BLUE CROSS/BLUE SHIELD

## 2014-12-25 DIAGNOSIS — D039 Melanoma in situ, unspecified: Secondary | ICD-10-CM

## 2014-12-25 HISTORY — DX: Melanoma in situ, unspecified: D03.9

## 2015-01-19 ENCOUNTER — Telehealth: Payer: Self-pay | Admitting: Internal Medicine

## 2015-01-19 DIAGNOSIS — I10 Essential (primary) hypertension: Secondary | ICD-10-CM

## 2015-01-19 NOTE — Telephone Encounter (Signed)
Letter printed and mailed to Pt, informing him he is overdue for his BMP from [place] w/ Dr. Larose Kells on 11/04/2014. Instructed him to call office to schedule non-fasting lab appt. BMP ordered.

## 2015-01-19 NOTE — Telephone Encounter (Signed)
Needed a BMP at the time of last visit, I don't think that ever happened. Please schedule a BMP DX hypertension

## 2015-03-29 ENCOUNTER — Other Ambulatory Visit: Payer: Self-pay | Admitting: Physician Assistant

## 2015-03-29 NOTE — Telephone Encounter (Signed)
Will defer further refills of patient's medications to PCP  

## 2015-04-02 ENCOUNTER — Telehealth: Payer: Self-pay

## 2015-04-02 MED ORDER — ALPRAZOLAM 1 MG PO TABS: 0.5000 mg | ORAL_TABLET | Freq: Every day | ORAL | Status: DC | PRN

## 2015-04-02 NOTE — Telephone Encounter (Signed)
Rx printed, awaiting MD signature.  

## 2015-04-02 NOTE — Telephone Encounter (Signed)
Pt is requesting refill on Alprazolam.  Last OV: 11/04/2014, F/U scheduled 05/07/2015 at [facility]: 05/09/2014 #30 and 3RF UDS: 09/04/2014 Low risk  Please advise.

## 2015-04-02 NOTE — Telephone Encounter (Signed)
Rx faxed to [facility] pharmacy.  

## 2015-04-02 NOTE — Telephone Encounter (Signed)
Okay 30 and 3 refills 

## 2015-04-22 ENCOUNTER — Ambulatory Visit: Payer: BLUE CROSS/BLUE SHIELD | Admitting: Internal Medicine

## 2015-04-23 ENCOUNTER — Other Ambulatory Visit: Payer: Self-pay | Admitting: Family Medicine

## 2015-04-23 ENCOUNTER — Ambulatory Visit (HOSPITAL_BASED_OUTPATIENT_CLINIC_OR_DEPARTMENT_OTHER)
Admission: RE | Admit: 2015-04-23 | Discharge: 2015-04-23 | Disposition: A | Discharge status: Home / Self Care | Payer: BLUE CROSS/BLUE SHIELD | Source: Ambulatory Visit | Attending: Family Medicine | Admitting: Family Medicine

## 2015-04-23 ENCOUNTER — Telehealth: Payer: Self-pay | Admitting: Internal Medicine

## 2015-04-23 ENCOUNTER — Encounter: Payer: Self-pay | Admitting: Family Medicine

## 2015-04-23 ENCOUNTER — Ambulatory Visit (INDEPENDENT_AMBULATORY_CARE_PROVIDER_SITE_OTHER): Payer: BLUE CROSS/BLUE SHIELD | Admitting: Family Medicine

## 2015-04-23 VITALS — BP 110/78 | HR 77 | Temp 97.7°F | Resp 16 | Ht 67.0 in | Wt 183.4 lb

## 2015-04-23 DIAGNOSIS — H539 Unspecified visual disturbance: Secondary | ICD-10-CM | POA: Insufficient documentation

## 2015-04-23 LAB — CBC WITH DIFFERENTIAL/PLATELET
BASOS PCT: 0.6 % (ref 0.0–3.0)
Basophils Absolute: 0 10*3/uL (ref 0.0–0.1)
EOS ABS: 0.1 10*3/uL (ref 0.0–0.7)
Eosinophils Relative: 1.5 % (ref 0.0–5.0)
HCT: 44.2 % (ref 39.0–52.0)
HEMOGLOBIN: 14.6 g/dL (ref 13.0–17.0)
Lymphocytes Relative: 35.9 % (ref 12.0–46.0)
Lymphs Abs: 2.3 10*3/uL (ref 0.7–4.0)
MCHC: 33 g/dL (ref 30.0–36.0)
MCV: 91.6 fl (ref 78.0–100.0)
MONO ABS: 0.4 10*3/uL (ref 0.1–1.0)
Monocytes Relative: 6.1 % (ref 3.0–12.0)
NEUTROS PCT: 55.9 % (ref 43.0–77.0)
Neutro Abs: 3.5 10*3/uL (ref 1.4–7.7)
PLATELETS: 314 10*3/uL (ref 150.0–400.0)
RBC: 4.83 Mil/uL (ref 4.22–5.81)
RDW: 13.7 % (ref 11.5–15.5)
WBC: 6.3 10*3/uL (ref 4.0–10.5)

## 2015-04-23 LAB — BASIC METABOLIC PANEL
BUN: 13 mg/dL (ref 6–23)
CALCIUM: 9.7 mg/dL (ref 8.4–10.5)
CO2: 30 meq/L (ref 19–32)
CREATININE: 1.07 mg/dL (ref 0.40–1.50)
Chloride: 103 mEq/L (ref 96–112)
GFR: 76.35 mL/min (ref >60.00)
Glucose, Bld: 100 mg/dL — ABNORMAL HIGH (ref 70–99)
Potassium: 4.1 mEq/L (ref 3.5–5.1)
Sodium: 139 mEq/L (ref 135–145)

## 2015-04-23 LAB — TSH: TSH: 3.49 u[IU]/mL (ref 0.35–4.50)

## 2015-04-23 LAB — HEMOGLOBIN A1C: HEMOGLOBIN A1C: 6.1 % (ref 4.6–6.5)

## 2015-04-23 NOTE — Telephone Encounter (Signed)
Pt was sent results via MyChart- please review w/ him

## 2015-04-23 NOTE — Assessment & Plan Note (Signed)
New.  Pt is concerned w/ possible TIA due to family hx of carotid artery stenosis.  Pt had no other neurologic sxs during this self limited event.  Sister has hx of visual migraines that she describes as similar.  Pt is completely asymptomatic in office today and neuro exam WNL.  Will get carotid dopplers, MRI/MRA, refer to neuro, and check labs to r/o metabolic disturbance that would account for visual changes.  Reviewed supportive care and red flags that should prompt return.  Pt expressed understanding and is in agreement w/ plan.

## 2015-04-23 NOTE — Telephone Encounter (Signed)
Relation to [id number] Call back number:[phone]   Reason for call:  patient inquiring about ultrasound results taken today

## 2015-04-23 NOTE — Telephone Encounter (Signed)
Spoke with Pt, informed him of US Carotid results, informed him that referral has been placed to neuro and he should receive a call in several days. Pt verbalized understanding. Informed to F/U with Dr. Larose Kells as needed. Pt verbalized understanding.

## 2015-04-23 NOTE — Progress Notes (Signed)
Pre visit review using our clinic review tool, if applicable. No additional management support is needed unless otherwise documented below in the visit note. 

## 2015-04-23 NOTE — Telephone Encounter (Signed)
Please advise 

## 2015-04-23 NOTE — Progress Notes (Signed)
   Subjective:    Patient ID: Jon Ee., male    DOB: [birth year], 54 y.o.   MRN: [id number]  HPI Cloudy vision- pt reports family hx of carotid artery disease.  Sister has hx of 'optic migraines' and told him that this sounds similar.  On 12/25, pt had just got up and noticed that all metallic items were excessively shiny.  Then noticed spinning 'spot' in center of R vision which migrated to the peripherery of the eye.  He was attempting to read his phone during this time and words were 'incomplete'.  sxs resolved completely w/in 30-60 minutes.  No hx of similar.  No HA at time of occurrence.  No weakness/numbness, dizziness, or difficulty w/ speech at time of event.  Pt had eye exam in July.   Review of Systems For ROS see HPI     Objective:   Physical Exam  Constitutional: He is oriented to person, place, and time. He appears well-developed and well-nourished. No distress.  HENT:  Head: Normocephalic and atraumatic.  Nose: Nose normal.  Mouth/Throat: Oropharynx is clear and moist.  TMs WNL bilaterally  Eyes: Conjunctivae and EOM are normal. Pupils are equal, round, and reactive to light. Right eye exhibits no discharge. Left eye exhibits no discharge.  Neck: Normal range of motion. Neck supple. No thyromegaly present.  Cardiovascular: Normal rate, regular rhythm and normal heart sounds.   Pulmonary/Chest: Effort normal and breath sounds normal. No respiratory distress. He has no wheezes. He has no rales.  Lymphadenopathy:    He has no cervical adenopathy.  Neurological: He is alert and oriented to person, place, and time. He has normal reflexes. No cranial nerve deficit. Coordination normal.  Skin: Skin is warm and dry.  Psychiatric: He has a normal mood and affect. His behavior is normal. Thought content normal.  Vitals reviewed.         Assessment & Plan:

## 2015-04-23 NOTE — Patient Instructions (Signed)
Follow up as needed We'll notify you of your lab results and make any changes if needed We'll notify you of your imaging reports once they are available If you again have symptoms, please go to the ER for complete evaluation We'll call you with your neuro appt Call with any questions or concerns Hang in there!! Happy New Year!!!

## 2015-05-07 ENCOUNTER — Ambulatory Visit: Payer: BLUE CROSS/BLUE SHIELD | Admitting: Internal Medicine

## 2015-05-11 ENCOUNTER — Encounter: Payer: Self-pay | Admitting: Internal Medicine

## 2015-05-11 ENCOUNTER — Ambulatory Visit (INDEPENDENT_AMBULATORY_CARE_PROVIDER_SITE_OTHER): Payer: BLUE CROSS/BLUE SHIELD | Admitting: Internal Medicine

## 2015-05-11 VITALS — BP 118/74 | HR 50 | Temp 98.3°F | Ht 67.0 in | Wt 183.5 lb

## 2015-05-11 DIAGNOSIS — F419 Anxiety disorder, unspecified: Secondary | ICD-10-CM

## 2015-05-11 DIAGNOSIS — M549 Dorsalgia, unspecified: Secondary | ICD-10-CM | POA: Diagnosis not present

## 2015-05-11 DIAGNOSIS — G8929 Other chronic pain: Secondary | ICD-10-CM

## 2015-05-11 DIAGNOSIS — E785 Hyperlipidemia, unspecified: Secondary | ICD-10-CM | POA: Diagnosis not present

## 2015-05-11 DIAGNOSIS — G43909 Migraine, unspecified, not intractable, without status migrainosus: Secondary | ICD-10-CM

## 2015-05-11 DIAGNOSIS — R7303 Prediabetes: Secondary | ICD-10-CM

## 2015-05-11 LAB — LIPID PANEL
CHOL/HDL RATIO: 5
CHOLESTEROL: 211 mg/dL — AB (ref 0–200)
HDL: 39.4 mg/dL (ref >39.00)
NonHDL: 171.7
TRIGLYCERIDES: 323 mg/dL — AB (ref 0.0–149.0)
VLDL: 64.6 mg/dL — AB (ref 0.0–40.0)

## 2015-05-11 LAB — LDL CHOLESTEROL, DIRECT: Direct LDL: 121 mg/dL

## 2015-05-11 LAB — SEDIMENTATION RATE: Sed Rate: 16 mm/hr (ref 0–22)

## 2015-05-11 MED ORDER — CELECOXIB 100 MG PO CAPS: 100.0000 mg | ORAL_CAPSULE | Freq: Two times a day (BID) | ORAL | Status: DC

## 2015-05-11 NOTE — Progress Notes (Signed)
Subjective:    Patient ID: Jon Ee., male    DOB: [birth year], 55 y.o.   MRN: [id number]  DOS:  05/11/2015 Type of visit - description : Routine visit   Interval history:  Since the last time I saw him, he came 04/23/2015 , saw Dr T. D/t visual disturbances:the  Room felt very bright, had a shinny spot at the center of the visual field that migrated laterally. Symptoms were very short shortly after he developed a mild nausea and headache that lasted a few hours. No confusion, no involuntary movements. 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  Chronic back pain- takes hydrocodone sporadically, wonders if Celebrex is appropriate, he took it before w/ relatively good results   Prediabetes, likes to check a A1c   Review of Systems Denies fever chills. Occasional dizziness when he moves his head and sometimes associated with ocular symptoms. Denies chest pain or difficulty breathing Denies slurred  speech, motor deficits, diplopia per se. GERD symptoms well controlled  Past Medical History  Diagnosis Date  . BPH (benign prostatic hyperplasia)   . GERD   . HERPES SIMPLEX, UNCOMPLICATED   . LUMBAR RADICULOPATHY, LEFT   . Other and unspecified hyperlipidemia   . Other chronic sinusitis   . Other testicular hypofunction   . PANIC ATTACK, ACUTE   . Cervical pain (neck)   . Fatty liver   . Diverticulosis   . BCC (basal cell carcinoma of skin) 03-2013    scalp  . Irritable bowel syndrome (IBS)     WITH DIARRHEA  . 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 Delice Lesch    Past Surgical History  Procedure Laterality Date  . Hernia repair      Inguinal  . Orchiectomy Right ~1995    benign  . Back surgery  2008  . Ganglion cyst excision      finger  . Colonoscopy    . Tonsillectomy    . Birth  mark removal      as baby    Social History   Social History  . Marital Status: Single    Spouse Name: N/A  . Number of Children: 0  . Years of Education: N/A   Occupational History  . furniture sales     Social History Main Topics  . Smoking status: Never Smoker   . Smokeless tobacco: Never Used  . Alcohol Use: 0.0 oz/week    0 Standard drinks or equivalent per week  . Drug Use: No  . Sexual Activity: Yes   Other Topics Concern  . Not on file   Social History Narrative   Lives w/ partner Arville Go        Medication List       This list is accurate as of: 05/11/15  6:00 PM.  Always use your most recent med list.               ALPRAZolam 1 MG tablet  Commonly known as:  XANAX  Take 0.5-1 tablets (0.5-1 mg total) by mouth daily as needed.     celecoxib 100 MG capsule  Commonly known as:  CELEBREX  Take 1 capsule (100 mg total) by mouth 2 (two) times daily.     cimetidine 200 MG tablet  Commonly known  as:  TAGAMET  Take 200 mg by mouth 2 (two) times daily.     GLUCOSAMINE 1500 COMPLEX PO  Take 1 tablet by mouth daily.     HYDROcodone-acetaminophen 10-325 MG tablet  Commonly known as:  NORCO  Take 1 tablet by mouth every 6 (six) hours as needed.     pantoprazole 40 MG tablet  Commonly known as:  PROTONIX  Take 1 tablet (40 mg total) by mouth daily.     RED YEAST RICE PO  Take 2,400 mg by mouth daily.     SUPER B COMPLEX PO  Take 2 tablets by mouth daily.     valACYclovir 500 MG tablet  Commonly known as:  VALTREX  Take 1 tablet (500 mg total) by mouth daily.     Vitamin D3 5000 units Caps  Take 1 capsule by mouth daily.           Objective:   Physical Exam BP 118/74 mmHg  Pulse 50  Temp(Src) 98.3 F (36.8 C) (Oral)  Ht 5\' 7"  (1.702 m)  Wt 183 lb 8 oz (83.235 kg)  BMI 28.73 kg/m2  SpO2 97% General:   Well developed, well nourished . NAD.  HEENT:  Normocephalic . Face symmetric, atraumatic Lungs:  CTA B Normal respiratory  effort, no intercostal retractions, no accessory muscle use. Heart: RRR,  no murmur.  No pretibial edema bilaterally  Skin: Not pale. Not jaundice Neurologic:  alert & oriented X3.  Speech normal, gait appropriate for age and unassisted. DTRs symmetric, motor symmetric, EOMI. Pupils equal and reactive Psych--  Cognition and judgment appear intact.  Cooperative with normal attention span and concentration.  Behavior appropriate. No anxious or depressed appearing.       Assessment & Plan:   Assessment prediabetes, A1c 6.0 (08/2014) Anxiety, panic attacks (self dc prozac 10-2014 dc feeling weird") GI: --IBS --GERD --Fatty liver GU: --BPH --H/o low testosterone IBS MSK: Chronic back pain after surgery SKIN: --BCC, scalp 2013 --Melanoma, right diet, 12-2014 H/o of testosterone  Plan: Prediabetes: A1c few days ago stable at 6.1. Diet and exercise discussed. Mild dyslipidemia with increase triglycerides: Check FLP Visual disturbances: 3 similar episodes in the last 2 weeks. 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 Already saw an eye doctor and a carotid ultrasound was (-). Reluctant to get MRI. Recommend sed rate and neuro referral  Back pain : Occasionally uses a hydrocodone, contract signed. Okay to try Celebrex 100 twice a day as needed. Anxiety: Well-controlled with Xanax as needed RTC 08/2015

## 2015-05-11 NOTE — Progress Notes (Signed)
Pre visit review using our clinic review tool, if applicable. No additional management support is needed unless otherwise documented below in the visit note. 

## 2015-05-11 NOTE — Patient Instructions (Signed)
BEFORE YOU LEAVE THE OFFICE:  GO TO THE LAB  Get the blood work    GO TO THE FRONT DESK Schedule a complete physical exam to be done in 4 months  Please be fasting     If your ocular migraines get more severe or frequent please let me know

## 2015-05-12 ENCOUNTER — Telehealth: Payer: Self-pay | Admitting: *Deleted

## 2015-05-12 ENCOUNTER — Ambulatory Visit: Payer: BLUE CROSS/BLUE SHIELD | Admitting: Neurology

## 2015-05-12 NOTE — Telephone Encounter (Signed)
Received fax from pharmacy requesting PA for Celecoxib 100 mg; Initiated via Cover My Meds, received "Favorable" response, faxed to pharmacy/SLS

## 2015-05-14 ENCOUNTER — Telehealth: Payer: Self-pay | Admitting: Internal Medicine

## 2015-05-14 NOTE — Telephone Encounter (Signed)
Have not been reviewed by Dr. Larose Kells yet. Will release to MyChart or call when reviewed.

## 2015-05-14 NOTE — Telephone Encounter (Signed)
Relation to [id number] Call back number:[phone]   Reason for call:  Patient inquiring about lab results patient states you can respond thru My Chart or call

## 2015-05-15 ENCOUNTER — Telehealth: Payer: Self-pay | Admitting: Internal Medicine

## 2015-05-15 NOTE — Telephone Encounter (Signed)
Spoke with patient, we are going to try and see if [facility] can get him in sooner

## 2015-05-15 NOTE — Telephone Encounter (Signed)
Relation to [id number] Call back number:[phone]   Reason for call:  Patient was unable to keep his neurologist appointment due to work conflict and when he tried to [facility] the next appointment is in March, patient would like to know if there is anything we can do or refer him to another neuro with a sooner appointment. Please advise.

## 2015-05-18 ENCOUNTER — Encounter (HOSPITAL_BASED_OUTPATIENT_CLINIC_OR_DEPARTMENT_OTHER): Payer: Self-pay | Admitting: *Deleted

## 2015-05-18 ENCOUNTER — Emergency Department (HOSPITAL_BASED_OUTPATIENT_CLINIC_OR_DEPARTMENT_OTHER)
Admission: EM | Admit: 2015-05-18 | Discharge: 2015-05-18 | Disposition: A | Discharge status: Home / Self Care | Payer: BLUE CROSS/BLUE SHIELD | Attending: Emergency Medicine | Admitting: Emergency Medicine

## 2015-05-18 DIAGNOSIS — Z8619 Personal history of other infectious and parasitic diseases: Secondary | ICD-10-CM | POA: Insufficient documentation

## 2015-05-18 DIAGNOSIS — Z79899 Other long term (current) drug therapy: Secondary | ICD-10-CM | POA: Diagnosis not present

## 2015-05-18 DIAGNOSIS — Z87438 Personal history of other diseases of male genital organs: Secondary | ICD-10-CM | POA: Insufficient documentation

## 2015-05-18 DIAGNOSIS — F410 Panic disorder [episodic paroxysmal anxiety] without agoraphobia: Secondary | ICD-10-CM | POA: Diagnosis not present

## 2015-05-18 DIAGNOSIS — E785 Hyperlipidemia, unspecified: Secondary | ICD-10-CM | POA: Diagnosis not present

## 2015-05-18 DIAGNOSIS — Z8709 Personal history of other diseases of the respiratory system: Secondary | ICD-10-CM | POA: Insufficient documentation

## 2015-05-18 DIAGNOSIS — K219 Gastro-esophageal reflux disease without esophagitis: Secondary | ICD-10-CM | POA: Insufficient documentation

## 2015-05-18 DIAGNOSIS — R030 Elevated blood-pressure reading, without diagnosis of hypertension: Secondary | ICD-10-CM | POA: Diagnosis not present

## 2015-05-18 DIAGNOSIS — Z85828 Personal history of other malignant neoplasm of skin: Secondary | ICD-10-CM | POA: Insufficient documentation

## 2015-05-18 LAB — BASIC METABOLIC PANEL
ANION GAP: 10 (ref 5–15)
BUN: 19 mg/dL (ref 6–20)
CALCIUM: 9.2 mg/dL (ref 8.9–10.3)
CO2: 23 mmol/L (ref 22–32)
Chloride: 107 mmol/L (ref 101–111)
Creatinine, Ser: 1.06 mg/dL (ref 0.61–1.24)
GFR calc Af Amer: >60 mL/min (ref >60)
GFR calc non Af Amer: >60 mL/min (ref >60)
GLUCOSE: 125 mg/dL — AB (ref 65–99)
Potassium: 4 mmol/L (ref 3.5–5.1)
Sodium: 140 mmol/L (ref 135–145)

## 2015-05-18 NOTE — ED Provider Notes (Signed)
CSN: [id number]     Arrival date & time 05/18/15  0441 History   First MD Initiated Contact with Patient 05/18/15 0510     Chief Complaint  Patient presents with  . Hypertension     (Consider location/radiation/quality/duration/timing/severity/associated sxs/prior Treatment) HPI  This is a 55 year old male who has been having some vague muscle "heaviness" that he noticed about midnight. He subsequently took his blood pressure and found it to be elevated at 195/100. This made him concerned because he normally has normal blood pressures and is requesting evaluation. It is noted that on his most recent doctor visit on the 16th of this month his blood pressure was 118/74. His blood pressure on arrival was 159/84 and on recheck was 141/87. He denies headache, acute change in vision, numbness, paresthesias, nausea, vomiting, diarrhea, chest pain, shortness of breath or cold symptoms. He has been seeing his physician, Dr. Larose Kells, for almost daily headaches for the past month. He has also been seen for dry mouth. He admits to the elevated blood pressure making him feel anxious.  Past Medical History  Diagnosis Date  . BPH (benign prostatic hyperplasia)   . GERD   . HERPES SIMPLEX, UNCOMPLICATED   . LUMBAR RADICULOPATHY, LEFT   . Other and unspecified hyperlipidemia   . Other chronic sinusitis   . Other testicular hypofunction   . PANIC ATTACK, ACUTE   . Cervical pain (neck)   . Fatty liver   . Diverticulosis   . BCC (basal cell carcinoma of skin) 03-2013    scalp  . Irritable bowel syndrome (IBS)     WITH DIARRHEA  . Melanoma in situ ([place]) 12/2014    On lateral right thigh, Dr. Delice Lesch   Past Surgical History  Procedure Laterality Date  . Hernia repair      Inguinal  . Orchiectomy Right ~1995    benign  . Back surgery  2008  . Ganglion cyst excision      finger  . Colonoscopy    . Tonsillectomy    . Birth mark removal      as baby   Family History  Problem Relation Age of Onset  .  Heart disease Mother   . Prostate cancer Father   . Lung cancer Father   . Colon cancer Paternal Grandmother   . Heart attack Paternal Grandfather   . Esophageal cancer Neg Hx   . Rectal cancer Neg Hx   . Stomach cancer Neg Hx   . Prostate cancer Paternal Uncle   . Heart disease Maternal Grandmother   . Cancer Maternal Uncle     prostate cancer   Social History  Substance Use Topics  . Smoking status: Never Smoker   . Smokeless tobacco: Never Used  . Alcohol Use: 0.0 oz/week    0 Standard drinks or equivalent per week    Review of Systems  All other systems reviewed and are negative.   Allergies  Erythromycin  Home Medications   Prior to Admission medications   Medication Sig Start Date End Date Taking? Authorizing Provider  ALPRAZolam Duanne Moron) 1 MG tablet Take 0.5-1 tablets (0.5-1 mg total) by mouth daily as needed. 04/02/15   Colon Branch, MD  B Complex-C (SUPER B COMPLEX PO) Take 2 tablets by mouth daily.    Historical Provider, MD  celecoxib (CELEBREX) 100 MG capsule Take 1 capsule (100 mg total) by mouth 2 (two) times daily. 05/11/15   Colon Branch, MD  Cholecalciferol (VITAMIN D3) 5000 UNITS CAPS  Take 1 capsule by mouth daily.    Historical Provider, MD  cimetidine (TAGAMET) 200 MG tablet Take 200 mg by mouth 2 (two) times daily.    Historical Provider, MD  Glucosamine-Chondroit-Vit C-Mn (GLUCOSAMINE 1500 COMPLEX PO) Take 1 tablet by mouth daily.    Historical Provider, MD  HYDROcodone-acetaminophen (NORCO) 10-325 MG tablet Take 1 tablet by mouth every 6 (six) hours as needed.    Historical Provider, MD  pantoprazole (PROTONIX) 40 MG tablet Take 1 tablet (40 mg total) by mouth daily. 09/04/14   Colon Branch, MD  Red Yeast Rice Extract (RED YEAST RICE PO) Take 2,400 mg by mouth daily.    Historical Provider, MD  valACYclovir (VALTREX) 500 MG tablet Take 1 tablet (500 mg total) by mouth daily. 10/01/14   Colon Branch, MD   BP 141/87 mmHg  Pulse 87  Temp(Src) 98.1 F (36.7 C)  (Oral)  Resp 20  Ht 5\' 7"  (1.702 m)  Wt 184 lb (83.462 kg)  BMI 28.81 kg/m2  SpO2 95%   Physical Exam  General: Well-developed, well-nourished male in no acute distress; appearance consistent with age of record HENT: normocephalic; atraumatic Eyes: pupils equal, round and reactive to light; extraocular muscles intact Neck: supple Heart: regular rate and rhythm Lungs: clear to auscultation bilaterally Abdomen: soft; nondistended; nontender; no masses or hepatosplenomegaly; bowel sounds present Extremities: No deformity; full range of motion; pulses normal Neurologic: Awake, alert and oriented; motor function intact in all extremities and symmetric; no facial droop; normal coordination speech Skin: Warm and dry Psychiatric: Normal mood and affect    ED Course  Procedures (including critical care time)   MDM  Nursing notes and vitals signs, including pulse oximetry, reviewed.  Summary of this visit's results, reviewed by myself:  Labs:  Results for orders placed or performed during the hospital encounter of 05/18/15 (from the past 24 hour(s))  Basic metabolic panel     Status: Abnormal   Collection Time: 05/18/15  5:20 AM  Result Value Ref Range   Sodium 140 135 - 145 mmol/L   Potassium 4.0 3.5 - 5.1 mmol/L   Chloride 107 101 - 111 mmol/L   CO2 23 22 - 32 mmol/L   Glucose, Bld 125 (H) 65 - 99 mg/dL   BUN 19 6 - 20 mg/dL   Creatinine, Ser 1.06 0.61 - 1.24 mg/dL   Calcium 9.2 8.9 - 10.3 mg/dL   GFR calc non Af Amer >60 >60 mL/min   GFR calc Af Amer >60 >60 mL/min   Anion gap 10 5 - 15   5:53 AM Blood pressure now 126/81. Suspect anxiety with significant contributor to his elevated blood pressure earlier. Patient's sugar is mildly elevated; his PCP is already following his hemoglobin A1c.    Shanon Rosser, MD 05/18/15 [phone]

## 2015-05-18 NOTE — ED Notes (Addendum)
Dr. Molpus into room, at BS. 

## 2015-05-18 NOTE — ED Notes (Addendum)
Reports NO h/o htn. Here tonight b/c BP was high at home tonight. Onset tonight. Pt of Dr. Larose Kells.  Took celebrex today at 1500.  Denies pain. Reports trunk "muscles feel heavy". (denies: HA, CP, nvd, fever, dizziness, cough, congestion, cold sx, numbness, tingling or other sx). Pt lists past recent problems not experienced tonight: mentions since 12/25 being seen for occular migraines & dry mouth. Suppose to see an ENT. Alert, NAD, calm. States, "I also have anxiety".

## 2015-05-20 ENCOUNTER — Ambulatory Visit (INDEPENDENT_AMBULATORY_CARE_PROVIDER_SITE_OTHER): Payer: BLUE CROSS/BLUE SHIELD | Admitting: Neurology

## 2015-05-20 ENCOUNTER — Encounter: Payer: Self-pay | Admitting: Neurology

## 2015-05-20 VITALS — BP 123/72 | HR 60 | Ht 67.0 in | Wt 185.0 lb

## 2015-05-20 DIAGNOSIS — F411 Generalized anxiety disorder: Secondary | ICD-10-CM | POA: Diagnosis not present

## 2015-05-20 DIAGNOSIS — G43109 Migraine with aura, not intractable, without status migrainosus: Secondary | ICD-10-CM

## 2015-05-20 DIAGNOSIS — G43909 Migraine, unspecified, not intractable, without status migrainosus: Secondary | ICD-10-CM | POA: Insufficient documentation

## 2015-05-20 MED ORDER — PROPRANOLOL HCL 80 MG PO TABS: 80.0000 mg | ORAL_TABLET | Freq: Two times a day (BID) | ORAL | Status: DC

## 2015-05-20 NOTE — Progress Notes (Signed)
PATIENT: Jon Richardson. DOB: 12/17/1960  Chief Complaint  Patient presents with  . Migraine    He is here to have his migraines evaluated. His first event occurred 04/19/15.  He has recently had a normal evaluation by ENT.  He has also had a normal US carotid.       HISTORICAL  Jon Richardson. is a 55 years old right-handed male, alone at visit, seen in refer by his primary care doctor Colon Branch in May 20 2015 for evaluation of migraine.  He denied previous history of migraine headache, suffered 3 very similar episode since December 2016, initial episode was April 19 2015, he noticed that onset floating blurry vision at the center of his visual field, then gradually moved to his right visual field, lasting for few minutes, followed by slight holocranial headaches,mild nausea for 1 day, second very similar episode was in December 28, then May 10 2015,  He also developed bilateral chest tightness, choking sensation in January 20 third 2017, noted to have elevated blood pressure at home 190 over 100, presented to the emergency room, cardiac evaluation was negative,  He now complains of lightheadedness with sudden positional change, mild neck low back pain.  Ultrasound of carotid arteryin December 2016 showed no significantnarrowing, I have personally reviewed MRI of cervical spine: degenerative disc disease at C5-6 and C6-7 with mild mass effect on the ventral thecal sac and mild bilateral foraminal stenosis.  MRI lumbar spine: mild impingement at L4-5 and L5 S1 secondary to degenerative disc disease and facet arthropathy.  Postoperative findings on the right at L5-S1 are similar to the prior exam, with epidural fibrosis in the right lateral recess.   REVIEW OF SYSTEMS: Full 14 system review of systems performed and notable only for confusion, dizziness, anxiety, allergies, sleepiness.  ALLERGIES: Allergies  Allergen Reactions  . Erythromycin Diarrhea      abd cramps    HOME MEDICATIONS: Current Outpatient Prescriptions  Medication Sig Dispense Refill  . ALPRAZolam (XANAX) 1 MG tablet Take 0.5-1 tablets (0.5-1 mg total) by mouth daily as needed. 30 tablet 3  . B Complex-C (SUPER B COMPLEX PO) Take 2 tablets by mouth daily.    . celecoxib (CELEBREX) 100 MG capsule Take 1 capsule (100 mg total) by mouth 2 (two) times daily. 60 capsule 1  . Cholecalciferol (VITAMIN D3) 5000 UNITS CAPS Take 1 capsule by mouth daily.    . cimetidine (TAGAMET) 200 MG tablet Take 200 mg by mouth 2 (two) times daily.    . Glucosamine-Chondroit-Vit C-Mn (GLUCOSAMINE 1500 COMPLEX PO) Take 1 tablet by mouth daily.    Marland Kitchen HYDROcodone-acetaminophen (NORCO) 10-325 MG tablet Take 1 tablet by mouth every 6 (six) hours as needed.    . pantoprazole (PROTONIX) 40 MG tablet Take 1 tablet (40 mg total) by mouth daily. 30 tablet 12  . Red Yeast Rice Extract (RED YEAST RICE PO) Take 2,400 mg by mouth daily.    . valACYclovir (VALTREX) 500 MG tablet Take 1 tablet (500 mg total) by mouth daily. 30 tablet 5   No current facility-administered medications for this visit.    PAST MEDICAL HISTORY: Past Medical History  Diagnosis Date  . BPH (benign prostatic hyperplasia)   . GERD   . HERPES SIMPLEX, UNCOMPLICATED   . LUMBAR RADICULOPATHY, LEFT   . Other and unspecified hyperlipidemia   . Other chronic sinusitis   . Other testicular hypofunction   . PANIC ATTACK, ACUTE   .  Cervical pain (neck)   . Fatty liver   . Diverticulosis   . BCC (basal cell carcinoma of skin) 03-2013    scalp  . Irritable bowel syndrome (IBS)     WITH DIARRHEA  . Melanoma in situ ([place]) 12/2014    On lateral right thigh, Dr. Delice Lesch  . Headache     PAST SURGICAL HISTORY: Past Surgical History  Procedure Laterality Date  . Hernia repair      Inguinal  . Orchiectomy Right ~1995    benign  . Back surgery  2008  . Ganglion cyst excision      finger  . Colonoscopy    . Tonsillectomy    . Birth  mark removal      as baby    FAMILY HISTORY: Family History  Problem Relation Age of Onset  . Heart disease Mother   . Prostate cancer Father   . Lung cancer Father   . Colon cancer Paternal Grandmother   . Heart attack Paternal Grandfather   . Esophageal cancer Neg Hx   . Rectal cancer Neg Hx   . Stomach cancer Neg Hx   . Prostate cancer Paternal Uncle   . Heart disease Maternal Grandmother   . Cancer Maternal Uncle     prostate cancer    SOCIAL HISTORY:  Social History   Social History  . Marital Status: Single    Spouse Name: N/A  . Number of Children: 0  . Years of Education: HS   Occupational History  . furniture sales     Social History Main Topics  . Smoking status: Never Smoker   . Smokeless tobacco: Never Used  . Alcohol Use: 0.0 oz/week    0 Standard drinks or equivalent per week     Comment: Approximately once a month  . Drug Use: No  . Sexual Activity: Yes   Other Topics Concern  . Not on file   Social History Narrative   Lives w/ partner Arville Go.   Right-handed.   2 cups coffee daily.     PHYSICAL EXAM   Filed Vitals:   05/20/15 0932  BP: 123/72  Pulse: 60  Height: 5\' 7"  (1.702 m)  Weight: 185 lb (83.915 kg)    Not recorded      Body mass index is 28.97 kg/(m^2).  PHYSICAL EXAMNIATION:  Gen: NAD, conversant, well nourised, obese, well groomed                     Cardiovascular: Regular rate rhythm, no peripheral edema, warm, nontender. Eyes: Conjunctivae clear without exudates or hemorrhage Neck: Supple, no carotid bruise. Pulmonary: Clear to auscultation bilaterally   NEUROLOGICAL EXAM:  MENTAL STATUS: Speech:    Speech is normal; fluent and spontaneous with normal comprehension.  Cognition:     Orientation to time, place and person     Normal recent and remote memory     Normal Attention span and concentration     Normal Language, naming, repeating,spontaneous speech     Fund of knowledge   CRANIAL NERVES: CN  II: Visual fields are full to confrontation. Fundoscopic exam is normal with sharp discs and no vascular changes. Pupils are round equal and briskly reactive to light. CN III, IV, VI: extraocular movement are normal. No ptosis. CN V: Facial sensation is intact to pinprick in all 3 divisions bilaterally. Corneal responses are intact.  CN VII: Face is symmetric with normal eye closure and smile. CN VIII: Hearing is normal  to rubbing fingers CN IX, X: Palate elevates symmetrically. Phonation is normal. CN XI: Head turning and shoulder shrug are intact CN XII: Tongue is midline with normal movements and no atrophy.  MOTOR: There is no pronator drift of out-stretched arms. Muscle bulk and tone are normal. Muscle strength is normal.  REFLEXES: Reflexes are 2+ and symmetric at the biceps, triceps, knees, and ankles. Plantar responses are flexor.  SENSORY: Intact to light touch, pinprick, position sense, and vibration sense are intact in fingers and toes.  COORDINATION: Rapid alternating movements and fine finger movements are intact. There is no dysmetria on finger-to-nose and heel-knee-shin.    GAIT/STANCE: Posture is normal. Gait is steady with normal steps, base, arm swing, and turning. Heel and toe walking are normal. Tandem gait is normal.  Romberg is absent.   DIAGNOSTIC DATA (LABS, IMAGING, TESTING) - I reviewed patient records, labs, notes, testing and imaging myself where available.   ASSESSMENT AND PLAN  Jon Richardson. is a 55 y.o. male   History is most consistent with ocular migraine  Proceed with Inderal 80 mg twice a day as preventive medications   new onset in 55 years old, will proceed with MRI of the brain to rule out structural lesion   He also complains of anxiety symptoms  Will continue follow-up with his primary care physician, he is taking Ativan as needed   Marcial Pacas, M.D. Ph.D.  [facility] [address] [place], [place] [address] Ph: [phone] Fax: [phone]  CC: Colon Branch, MD

## 2015-05-26 ENCOUNTER — Ambulatory Visit (INDEPENDENT_AMBULATORY_CARE_PROVIDER_SITE_OTHER): Payer: BLUE CROSS/BLUE SHIELD | Admitting: Family Medicine

## 2015-05-26 VITALS — BP 150/88 | HR 76 | Temp 98.6°F | Resp 16 | Ht 67.0 in | Wt 185.0 lb

## 2015-05-26 DIAGNOSIS — F411 Generalized anxiety disorder: Secondary | ICD-10-CM

## 2015-05-26 DIAGNOSIS — R0789 Other chest pain: Secondary | ICD-10-CM

## 2015-05-26 DIAGNOSIS — G43109 Migraine with aura, not intractable, without status migrainosus: Secondary | ICD-10-CM

## 2015-05-26 DIAGNOSIS — G43809 Other migraine, not intractable, without status migrainosus: Secondary | ICD-10-CM

## 2015-05-26 MED ORDER — FLUOXETINE HCL 20 MG PO TABS: 20.0000 mg | ORAL_TABLET | Freq: Every day | ORAL | Status: DC

## 2015-05-26 NOTE — Progress Notes (Addendum)
Subjective:  By signing my name below, I, Jon Richardson, attest that this documentation has been prepared under the direction and in the presence of Jon Ray, MD.  Jon Richardson, Medical Scribe. 05/26/2015.  3:14 PM.  . Patient ID: Jon Richardson., male    DOB: March 02, 1961, 55 y.o.   MRN: [id number]   I personally performed the services described in this documentation, which was scribed in my presence. The recorded information has been reviewed and considered, and addended by me as needed.    Chief Complaint  Patient presents with  . establish care    pt. needs talk to Dr. Carlota Raspberry   . OTHER    disscus things that is going on with him     HPI HPI Comments: Jon Richardson. is a 55 y.o. male who presents to [facility] to establish care. He is a new pt to me. Previously followed by Dr. Arnoldo Morale, He transferred his car to Dr. Larose Kells in 04/2013. He has a hx of multiple medical problems by problem list, including chronic back pain, on hydrocodone and Celebrex, pre diabetes, anxiety, IBS, BPH, and testosterone deficiency. Pt was also treated for ocular migraine. Pt reports that he is not satisfied with his current PCP due to not focusing on problems that.   Migraine:  Pt reports that he has been experiencing increased ocular migraine episodes (3) since December. Pt also notes that he has been having increased anxiety, with possibly associated elevated blood pressure of 194/100 for which her was seen at the ER, however he notes that he was not diagnosed with HTN. Pt followed up with neuro, Dr. Krista Blue, who advised him that he was having Ocular migraines, and was prescribed Inderal 80 mg BID. Pt has no started taking this medication due to the pharmacy needing to order it. He was also seen by optho and ENT specialist for these symptoms, and was advised that he had ocular migraines. Pt has an MRI scheduled for next month. Pt reports that he has been  experiencing symptoms of mild nausea and headache with his ocular migraine episodes.   Anxiety: Pt reports that he takes Xanax, prn for his anxiety. He indicates that he usually takes only 2-3 pills per 3 weeks. Last intake was yesterday. He states that he tried zoloft and lexapro previously, however he experienced side effects and did not like the way they made him feel. Pt states however that his anxiety symptoms have been worsening lately. Pt does follow up with a therapist, his last visit was in October.   Chest pain: Pt reports that he is experiencing left sided chest pain on the frontal area of the chest, and radiated to both arms, starting onset during this past week. Pt states that the symptoms area usually present when at rest, and last for about 2 hours. He reports that sitting up does alleviate the pain. Pt is on PPI for acid reflux. He does not report experiencing any break through heart burn with his chest pain episodes. He denies nausea associated with this, vomiting, diaphoresis, shortness of breath, belching, or heart burn.  Pt has had a cardiac stress test done more than 4 years ago that showed normal results. He does not follow up with a cardiologist regularly, and notes that he does not take daily aspirin either (Pt was never advised not to).   Patient Active Problem List   Diagnosis Date Noted  . Migraine 05/20/2015  . Generalized  anxiety disorder 05/20/2015  . Transient vision disturbance of right eye 04/23/2015  . Prediabetes 11/04/2014  . Annual physical exam 09/04/2014  . Cervical disc disorder with radiculopathy of cervical region 08/29/2012  . 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 BPH (benign prostatic hyperplasia) 07/22/2008  . Back pain-chronic-status post surgery 07/22/2008  . GERD 11/12/2007  . Anxiety-   07/11/2007  . PERSISTENT DISORDER INITIATING/MAINTAINING SLEEP 05/30/2007  . OTHER CHRONIC SINUSITIS 05/30/2007  . 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  Other and unspecified hyperlipidemia 11/22/2006  . HERPES SIMPLEX, UNCOMPLICATED 0000000  . IRRITABLE BOWEL SYNDROME 11/10/2006   Past Medical History  Diagnosis Date  . BPH (benign prostatic hyperplasia)   . GERD   . HERPES SIMPLEX, UNCOMPLICATED   . LUMBAR RADICULOPATHY, LEFT   . Other and unspecified hyperlipidemia   . Other chronic sinusitis   . Other testicular hypofunction   . PANIC ATTACK, ACUTE   . Cervical pain (neck)   . Fatty liver   . Diverticulosis   . BCC (basal cell carcinoma of skin) 03-2013    scalp  . Irritable bowel syndrome (IBS)     WITH DIARRHEA  . 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 Delice Lesch  . Headache    Past Surgical History  Procedure Laterality Date  . Hernia repair      Inguinal  . Orchiectomy Right ~1995    benign  . Back surgery  2008  . Ganglion cyst excision      finger  . Colonoscopy    . Tonsillectomy    . Birth mark removal      as baby   Allergies  Allergen Reactions  . Erythromycin Diarrhea    abd cramps   Prior to Admission medications   Medication Sig Start Date End Date Taking? Authorizing Provider  ALPRAZolam Duanne Moron) 1 MG tablet Take 0.5-1 tablets (0.5-1 mg total) by mouth daily as needed. 04/02/15  Yes Colon Branch, MD  B Complex-C (SUPER B COMPLEX PO) Take 2 tablets by mouth daily.   Yes Historical Provider, MD  celecoxib (CELEBREX) 100 MG capsule Take 1 capsule (100 mg total) by mouth 2 (two) times daily. 05/11/15  Yes Colon Branch, MD  Cholecalciferol (VITAMIN D3) 5000 UNITS CAPS Take 1 capsule by mouth daily.   Yes Historical Provider, MD  cimetidine (TAGAMET) 200 MG tablet Take 200 mg by mouth 2 (two) times daily.   Yes Historical Provider, MD  Glucosamine-Chondroit-Vit C-Mn (GLUCOSAMINE 1500 COMPLEX PO) Take 1 tablet by mouth daily.   Yes Historical Provider, MD  HYDROcodone-acetaminophen (NORCO) 10-325 MG tablet Take 1 tablet by mouth every 6 (six) hours as needed.   Yes Historical Provider, MD    pantoprazole (PROTONIX) 40 MG tablet Take 1 tablet (40 mg total) by mouth daily. 09/04/14  Yes Colon Branch, MD  propranolol (INDERAL) 80 MG tablet Take 1 tablet (80 mg total) by mouth 2 (two) times daily. 05/20/15  Yes Marcial Pacas, MD  Red Yeast Rice Extract (RED YEAST RICE PO) Take 2,400 mg by mouth daily.   Yes Historical Provider, MD  valACYclovir (VALTREX) 500 MG tablet Take 1 tablet (500 mg total) by mouth daily. 10/01/14  Yes Colon Branch, MD   Social History   Social History  . Marital Status: Single    Spouse Name: N/A  . Number of Children: 0  . Years of Education: HS   Occupational History  . Economist  Social History Main Topics  . Smoking status: Never Smoker   . Smokeless tobacco: Never Used  . Alcohol Use: 0.0 oz/week    0 Standard drinks or equivalent per week     Comment: Approximately once a month  . Drug Use: No  . Sexual Activity: Yes   Other Topics Concern  . Not on file   Social History Narrative   Lives w/ partner Arville Go.   Right-handed.   2 cups coffee daily.    Review of Systems  Constitutional: Negative for diaphoresis.  Respiratory: Negative for shortness of breath.   Cardiovascular: Positive for chest pain.  Gastrointestinal: Positive for nausea. Negative for vomiting.  Neurological: Positive for headaches.  Psychiatric/Behavioral: The patient is nervous/anxious.       Objective:   Physical Exam  Constitutional: He is oriented to person, place, and time. He appears well-developed and well-nourished. No distress.  HENT:  Head: Normocephalic and atraumatic.  Eyes: EOM are normal. Pupils are equal, round, and reactive to light.  Neck: Neck supple. No JVD present. Carotid bruit is not present.  Cardiovascular: Normal rate, regular rhythm and normal heart sounds.   No murmur heard. Pulmonary/Chest: Effort normal and breath sounds normal. He has no rales.  Musculoskeletal: He exhibits no edema.  Neurological: He is alert and oriented to  person, place, and time. No cranial nerve deficit.  Skin: Skin is warm and dry.  Psychiatric: He has a normal mood and affect. His behavior is normal.  Nursing note and vitals reviewed.   Filed Vitals:   05/26/15 1436  BP: 150/88  Pulse: 76  Temp: 98.6 F (37 C)  TempSrc: Oral  Resp: 16  Height: 5\' 7"  (1.702 m)  Weight: 185 lb (83.915 kg)  SpO2: 98%     EKG: Sinus rhythm, no acute findings.    Assessment & Plan:   Daeson Tehrani Virnig Brooke Richardson. is a 55 y.o. male Other chest pain - Plan: EKG 12-Lead, Ambulatory referral to Cardiology  - Reassuring EKG, risk factors of age, hyperlipidemia, prediabetes. Will refer to cardiology, but doubt cardiac source at this time. start aspirin 81 mg daily, but suspect anxiety component. See information below on anxiety. RTC/911 precautions discussed.  Ocular migraine  - Has not yet started Inderal, this may also help with blood pressure and migraine. MRI pending. Continue follow-up with neurology, RTC precautions.  Generalized anxiety disorder - Plan: FLUoxetine (PROZAC) 20 MG tablet  -Uncontrolled based on history provided today. Did not tolerate Lexapro or Zoloft in the past, but may not have been on it long enough to determine control or beyond initial side effects.   -Decided on Prozac trial, 20 mg daily. Discussed initial side effects for the first week or 2, but this should improve.    -Has Xanax if needed for increased anxiety during that time.   -Follow-up with me in the next 2-3 weeks it has not followed up with his new primary provider. Discussed that unfortunately I'm not able to take new patients at this time, so did recommend he discuss with his current office other providers accepting new patients.  I did agree to follow him for the next few weeks at least on the new start of Prozac. RTC precautions.  Meds ordered this encounter  Medications  . FLUoxetine (PROZAC) 20 MG tablet    Sig: Take 1 tablet (20 mg total) by mouth daily.     Dispense:  30 tablet    Refill:  1   Patient Instructions  I suspect some of your symptoms are due to the underlying anxiety. Start Prozac once per day as discussed. Follow-up with me or other provider within the next 2-3 weeks to see how this medication is doing. You may need to use Xanax intermittently during the first week or 2 due to some side effects with starting this class of medicines. Call your therapist for follow-up as this is also a important treatment for anxiety.  Your EKG appears reassuring in the office, I will refer you to cardiology, but if any worsening of the chest pains, shortness of breath, nausea, or otherwise worsening with chest pain, be seen in emergency room or call 911 if you feel it is an emergency.  Nonspecific Chest Pain  Chest pain can be caused by many different conditions. There is always a chance that your pain could be related to something serious, such as a heart attack or a blood clot in your lungs. Chest pain can also be caused by conditions that are not life-threatening. If you have chest pain, it is very important to follow up with your health care provider. CAUSES  Chest pain can be caused by:  Heartburn.  Pneumonia or bronchitis.  Anxiety or stress.  Inflammation around your heart (pericarditis) or lung (pleuritis or pleurisy).  A blood clot in your lung.  A collapsed lung (pneumothorax). It can develop suddenly on its own (spontaneous pneumothorax) or from trauma to the chest.  Shingles infection (varicella-zoster virus).  Heart attack.  Damage to the bones, muscles, and cartilage that make up your chest wall. This can include:  Bruised bones due to injury.  Strained muscles or cartilage due to frequent or repeated coughing or overwork.  Fracture to one or more ribs.  Sore cartilage due to inflammation (costochondritis). RISK FACTORS  Risk factors for chest pain may include:  Activities that increase your risk for trauma or injury  to your chest.  Respiratory infections or conditions that cause frequent coughing.  Medical conditions or overeating that can cause heartburn.  Heart disease or family history of heart disease.  Conditions or health behaviors that increase your risk of developing a blood clot.  Having had chicken pox (varicella zoster). SIGNS AND SYMPTOMS Chest pain can feel like:  Burning or tingling on the surface of your chest or deep in your chest.  Crushing, pressure, aching, or squeezing pain.  Dull or sharp pain that is worse when you move, cough, or take a deep breath.  Pain that is also felt in your back, neck, shoulder, or arm, or pain that spreads to any of these areas. Your chest pain may come and go, or it may stay constant. DIAGNOSIS Lab tests or other studies may be needed to find the cause of your pain. Your health care provider may have you take a test called an ambulatory ECG (electrocardiogram). An ECG records your heartbeat patterns at the time the test is performed. You may also have other tests, such as:  Transthoracic echocardiogram (TTE). During echocardiography, sound waves are used to create a picture of all of the heart structures and to look at how blood flows through your heart.  Transesophageal echocardiogram (TEE).This is a more advanced imaging test that obtains images from inside your body. It allows your health care provider to see your heart in finer detail.  Cardiac monitoring. This allows your health care provider to monitor your heart rate and rhythm in real time.  Holter monitor. This is a portable device that records your  heartbeat and can help to diagnose abnormal heartbeats. It allows your health care provider to track your heart activity for several days, if needed.  Stress tests. These can be done through exercise or by taking medicine that makes your heart beat more quickly.  Blood tests.  Imaging tests. TREATMENT  Your treatment depends on what is  causing your chest pain. Treatment may include:  Medicines. These may include:  Acid blockers for heartburn.  Anti-inflammatory medicine.  Pain medicine for inflammatory conditions.  Antibiotic medicine, if an infection is present.  Medicines to dissolve blood clots.  Medicines to treat coronary artery disease.  Supportive care for conditions that do not require medicines. This may include:  Resting.  Applying heat or cold packs to injured areas.  Limiting activities until pain decreases. HOME CARE INSTRUCTIONS  If you were prescribed an antibiotic medicine, finish it all even if you start to feel better.  Avoid any activities that bring on chest pain.  Do not use any tobacco products, including cigarettes, chewing tobacco, or electronic cigarettes. If you need help quitting, ask your health care provider.  Do not drink alcohol.  Take medicines only as directed by your health care provider.  Keep all follow-up visits as directed by your health care provider. This is important. This includes any further testing if your chest pain does not go away.  If heartburn is the cause for your chest pain, you may be told to keep your head raised (elevated) while sleeping. This reduces the chance that acid will go from your stomach into your esophagus.  Make lifestyle changes as directed by your health care provider. These may include:  Getting regular exercise. Ask your health care provider to suggest some activities that are safe for you.  Eating a heart-healthy diet. A registered dietitian can help you to learn healthy eating options.  Maintaining a healthy weight.  Managing diabetes, if necessary.  Reducing stress. SEEK MEDICAL CARE IF:  Your chest pain does not go away after treatment.  You have a rash with blisters on your chest.  You have a fever. SEEK IMMEDIATE MEDICAL CARE IF:   Your chest pain is worse.  You have an increasing cough, or you cough up  blood.  You have severe abdominal pain.  You have severe weakness.  You faint.  You have chills.  You have sudden, unexplained chest discomfort.  You have sudden, unexplained discomfort in your arms, back, neck, or jaw.  You have shortness of breath at any time.  You suddenly start to sweat, or your skin gets clammy.  You feel nauseous or you vomit.  You suddenly feel light-headed or dizzy.  Your heart begins to beat quickly, or it feels like it is skipping beats. These symptoms may represent a serious problem that is an emergency. Do not wait to see if the symptoms will go away. Get medical help right away. Call your local emergency services (911 in the [place] Do not drive yourself to the hospital.   This information is not intended to replace advice given to you by your health care provider. Make sure you discuss any questions you have with your health care provider.   Document Released: 01/19/2005 Document Revised: 05/02/2014 Document Reviewed: 11/15/2013  Generalized Anxiety Disorder Generalized anxiety disorder (GAD) is a mental disorder. It interferes with life functions, including relationships, work, and school. GAD is different from normal anxiety, which everyone experiences at some point in their lives in response to specific life events and  activities. Normal anxiety actually helps [place] prepare for and get through these life events and activities. Normal anxiety goes away after the event or activity is over.  GAD causes anxiety that is not necessarily related to specific events or activities. It also causes excess anxiety in proportion to specific events or activities. The anxiety associated with GAD is also difficult to control. GAD can vary from mild to severe. People with severe GAD can have intense waves of anxiety with physical symptoms (panic attacks).  SYMPTOMS The anxiety and worry associated with GAD are difficult to control. This anxiety and worry are related to  many life events and activities and also occur more days than not for 6 months or longer. People with GAD also have three or more of the following symptoms (one or more in children):  Restlessness.   Fatigue.  Difficulty concentrating.   Irritability.  Muscle tension.  Difficulty sleeping or unsatisfying sleep. DIAGNOSIS GAD is diagnosed through an assessment by your health care provider. Your health care provider will ask you questions aboutyour mood,physical symptoms, and events in your life. Your health care provider may ask you about your medical history and use of alcohol or drugs, including prescription medicines. Your health care provider may also do a physical exam and blood tests. Certain medical conditions and the use of certain substances can cause symptoms similar to those associated with GAD. Your health care provider may refer you to a mental health specialist for further evaluation. TREATMENT The following therapies are usually used to treat GAD:   Medication. Antidepressant medication usually is prescribed for long-term daily control. Antianxiety medicines may be added in severe cases, especially when panic attacks occur.   Talk therapy (psychotherapy). Certain types of talk therapy can be helpful in treating GAD by providing support, education, and guidance. A form of talk therapy called cognitive behavioral therapy can teach you healthy ways to think about and react to daily life events and activities.  Stress managementtechniques. These include yoga, meditation, and exercise and can be very helpful when they are practiced regularly. A mental health specialist can help determine which treatment is best for you. Some people see improvement with one therapy. However, other people require a combination of therapies.   This information is not intended to replace advice given to you by your health care provider. Make sure you discuss any questions you have with your health  care provider.   Document Released: 08/06/2012 Document Revised: 05/02/2014 Document Reviewed: 08/06/2012 [employer] Interactive Patient Education 2016 [employer] Interactive Patient Education [employer].        I personally performed the services described in this documentation, which was scribed in my presence. The recorded information has been reviewed and considered, and addended by me as needed.

## 2015-05-26 NOTE — Patient Instructions (Signed)
I suspect some of your symptoms are due to the underlying anxiety. Start Prozac once per day as discussed. Follow-up with me or other provider within the next 2-3 weeks to see how this medication is doing. You may need to use Xanax intermittently during the first week or 2 due to some side effects with starting this class of medicines. Call your therapist for follow-up as this is also a important treatment for anxiety.  Your EKG appears reassuring in the office, I will refer you to cardiology, but if any worsening of the chest pains, shortness of breath, nausea, or otherwise worsening with chest pain, be seen in emergency room or call 911 if you feel it is an emergency.  Nonspecific Chest Pain  Chest pain can be caused by many different conditions. There is always a chance that your pain could be related to something serious, such as a heart attack or a blood clot in your lungs. Chest pain can also be caused by conditions that are not life-threatening. If you have chest pain, it is very important to follow up with your health care provider. CAUSES  Chest pain can be caused by:  Heartburn.  Pneumonia or bronchitis.  Anxiety or stress.  Inflammation around your heart (pericarditis) or lung (pleuritis or pleurisy).  A blood clot in your lung.  A collapsed lung (pneumothorax). It can develop suddenly on its own (spontaneous pneumothorax) or from trauma to the chest.  Shingles infection (varicella-zoster virus).  Heart attack.  Damage to the bones, muscles, and cartilage that make up your chest wall. This can include:  Bruised bones due to injury.  Strained muscles or cartilage due to frequent or repeated coughing or overwork.  Fracture to one or more ribs.  Sore cartilage due to inflammation (costochondritis). RISK FACTORS  Risk factors for chest pain may include:  Activities that increase your risk for trauma or injury to your chest.  Respiratory infections or conditions that  cause frequent coughing.  Medical conditions or overeating that can cause heartburn.  Heart disease or family history of heart disease.  Conditions or health behaviors that increase your risk of developing a blood clot.  Having had chicken pox (varicella zoster). SIGNS AND SYMPTOMS Chest pain can feel like:  Burning or tingling on the surface of your chest or deep in your chest.  Crushing, pressure, aching, or squeezing pain.  Dull or sharp pain that is worse when you move, cough, or take a deep breath.  Pain that is also felt in your back, neck, shoulder, or arm, or pain that spreads to any of these areas. Your chest pain may come and go, or it may stay constant. DIAGNOSIS Lab tests or other studies may be needed to find the cause of your pain. Your health care provider may have you take a test called an ambulatory ECG (electrocardiogram). An ECG records your heartbeat patterns at the time the test is performed. You may also have other tests, such as:  Transthoracic echocardiogram (TTE). During echocardiography, sound waves are used to create a picture of all of the heart structures and to look at how blood flows through your heart.  Transesophageal echocardiogram (TEE).This is a more advanced imaging test that obtains images from inside your body. It allows your health care provider to see your heart in finer detail.  Cardiac monitoring. This allows your health care provider to monitor your heart rate and rhythm in real time.  Holter monitor. This is a portable device that records your  heartbeat and can help to diagnose abnormal heartbeats. It allows your health care provider to track your heart activity for several days, if needed.  Stress tests. These can be done through exercise or by taking medicine that makes your heart beat more quickly.  Blood tests.  Imaging tests. TREATMENT  Your treatment depends on what is causing your chest pain. Treatment may  include:  Medicines. These may include:  Acid blockers for heartburn.  Anti-inflammatory medicine.  Pain medicine for inflammatory conditions.  Antibiotic medicine, if an infection is present.  Medicines to dissolve blood clots.  Medicines to treat coronary artery disease.  Supportive care for conditions that do not require medicines. This may include:  Resting.  Applying heat or cold packs to injured areas.  Limiting activities until pain decreases. HOME CARE INSTRUCTIONS  If you were prescribed an antibiotic medicine, finish it all even if you start to feel better.  Avoid any activities that bring on chest pain.  Do not use any tobacco products, including cigarettes, chewing tobacco, or electronic cigarettes. If you need help quitting, ask your health care provider.  Do not drink alcohol.  Take medicines only as directed by your health care provider.  Keep all follow-up visits as directed by your health care provider. This is important. This includes any further testing if your chest pain does not go away.  If heartburn is the cause for your chest pain, you may be told to keep your head raised (elevated) while sleeping. This reduces the chance that acid will go from your stomach into your esophagus.  Make lifestyle changes as directed by your health care provider. These may include:  Getting regular exercise. Ask your health care provider to suggest some activities that are safe for you.  Eating a heart-healthy diet. A registered dietitian can help you to learn healthy eating options.  Maintaining a healthy weight.  Managing diabetes, if necessary.  Reducing stress. SEEK MEDICAL CARE IF:  Your chest pain does not go away after treatment.  You have a rash with blisters on your chest.  You have a fever. SEEK IMMEDIATE MEDICAL CARE IF:   Your chest pain is worse.  You have an increasing cough, or you cough up blood.  You have severe abdominal pain.  You  have severe weakness.  You faint.  You have chills.  You have sudden, unexplained chest discomfort.  You have sudden, unexplained discomfort in your arms, back, neck, or jaw.  You have shortness of breath at any time.  You suddenly start to sweat, or your skin gets clammy.  You feel nauseous or you vomit.  You suddenly feel light-headed or dizzy.  Your heart begins to beat quickly, or it feels like it is skipping beats. These symptoms may represent a serious problem that is an emergency. Do not wait to see if the symptoms will go away. Get medical help right away. Call your local emergency services (911 in the [place] Do not drive yourself to the hospital.   This information is not intended to replace advice given to you by your health care provider. Make sure you discuss any questions you have with your health care provider.   Document Released: 01/19/2005 Document Revised: 05/02/2014 Document Reviewed: 11/15/2013  Generalized Anxiety Disorder Generalized anxiety disorder (GAD) is a mental disorder. It interferes with life functions, including relationships, work, and school. GAD is different from normal anxiety, which everyone experiences at some point in their lives in response to specific life events and  activities. Normal anxiety actually helps [place] prepare for and get through these life events and activities. Normal anxiety goes away after the event or activity is over.  GAD causes anxiety that is not necessarily related to specific events or activities. It also causes excess anxiety in proportion to specific events or activities. The anxiety associated with GAD is also difficult to control. GAD can vary from mild to severe. People with severe GAD can have intense waves of anxiety with physical symptoms (panic attacks).  SYMPTOMS The anxiety and worry associated with GAD are difficult to control. This anxiety and worry are related to many life events and activities and also occur  more days than not for 6 months or longer. People with GAD also have three or more of the following symptoms (one or more in children):  Restlessness.   Fatigue.  Difficulty concentrating.   Irritability.  Muscle tension.  Difficulty sleeping or unsatisfying sleep. DIAGNOSIS GAD is diagnosed through an assessment by your health care provider. Your health care provider will ask you questions aboutyour mood,physical symptoms, and events in your life. Your health care provider may ask you about your medical history and use of alcohol or drugs, including prescription medicines. Your health care provider may also do a physical exam and blood tests. Certain medical conditions and the use of certain substances can cause symptoms similar to those associated with GAD. Your health care provider may refer you to a mental health specialist for further evaluation. TREATMENT The following therapies are usually used to treat GAD:   Medication. Antidepressant medication usually is prescribed for long-term daily control. Antianxiety medicines may be added in severe cases, especially when panic attacks occur.   Talk therapy (psychotherapy). Certain types of talk therapy can be helpful in treating GAD by providing support, education, and guidance. A form of talk therapy called cognitive behavioral therapy can teach you healthy ways to think about and react to daily life events and activities.  Stress managementtechniques. These include yoga, meditation, and exercise and can be very helpful when they are practiced regularly. A mental health specialist can help determine which treatment is best for you. Some people see improvement with one therapy. However, other people require a combination of therapies.   This information is not intended to replace advice given to you by your health care provider. Make sure you discuss any questions you have with your health care provider.   Document Released:  08/06/2012 Document Revised: 05/02/2014 Document Reviewed: 08/06/2012 Elsevier Interactive Patient Education 2016 [employer].  Chartered certified accountant Patient Education [employer].

## 2015-05-26 NOTE — Addendum Note (Signed)
Addended by: [name] R on: 05/26/2015 04:16 PM   Modules accepted: Level of Service

## 2015-05-27 ENCOUNTER — Other Ambulatory Visit: Payer: BLUE CROSS/BLUE SHIELD

## 2015-06-03 ENCOUNTER — Ambulatory Visit (INDEPENDENT_AMBULATORY_CARE_PROVIDER_SITE_OTHER): Payer: BLUE CROSS/BLUE SHIELD | Admitting: Internal Medicine

## 2015-06-03 ENCOUNTER — Encounter: Payer: Self-pay | Admitting: Internal Medicine

## 2015-06-03 ENCOUNTER — Ambulatory Visit (HOSPITAL_BASED_OUTPATIENT_CLINIC_OR_DEPARTMENT_OTHER)
Admission: RE | Admit: 2015-06-03 | Discharge: 2015-06-03 | Disposition: A | Discharge status: Home / Self Care | Payer: BLUE CROSS/BLUE SHIELD | Source: Ambulatory Visit | Attending: Internal Medicine | Admitting: Internal Medicine

## 2015-06-03 VITALS — BP 122/76 | HR 75 | Temp 97.9°F | Ht 67.0 in | Wt 184.2 lb

## 2015-06-03 DIAGNOSIS — R079 Chest pain, unspecified: Secondary | ICD-10-CM | POA: Diagnosis present

## 2015-06-03 DIAGNOSIS — R0789 Other chest pain: Secondary | ICD-10-CM

## 2015-06-03 DIAGNOSIS — G43809 Other migraine, not intractable, without status migrainosus: Secondary | ICD-10-CM

## 2015-06-03 DIAGNOSIS — F411 Generalized anxiety disorder: Secondary | ICD-10-CM | POA: Diagnosis not present

## 2015-06-03 DIAGNOSIS — G43109 Migraine with aura, not intractable, without status migrainosus: Secondary | ICD-10-CM

## 2015-06-03 NOTE — Progress Notes (Signed)
Pre visit review using our clinic review tool, if applicable. No additional management support is needed unless otherwise documented below in the visit note. 

## 2015-06-03 NOTE — Patient Instructions (Addendum)
GO TO THE FRONT DESK    Schedule a routine office visit or check up to be done in  6 weeks Please be fasting   Front desk:   30   STOP BY THE FIRST FLOOR:  get the XR    To  the ER if severe pain, shortness or breath or a rash.

## 2015-06-03 NOTE — Progress Notes (Signed)
Subjective:    Patient ID: Jon Hipp., male    DOB: Mar 16, 1961, 55 y.o.   MRN: [id number]  DOS:  06/03/2015 Type of visit - description : Follow-up Interval history: Since the last visit, saw neurology, DX with ocular migraines, prescribed Inderal and a MRI (I don't see the results). Also has developed chest pain for about a week: Left upper chest, some radiation posteriorly, no radiation to the neck, no associated with food, at rest, able to walk 1.5 miles without symptoms. On and off. Because the chest pain he has gotten very anxious, went to urgent care, EKG was unremarkable, he was prescribed Prozac again, he took it temporarily but couldn't tolerate. Continue with anxiety, moderate to severe.    Review of Systems Denies fever chills. No dyspnea on exertion, cough. GERD symptoms well controlled on PPIs No rash anywhere. No recent prolonged trip or airplane trip, no leg swelling or pain.   Past Medical History  Diagnosis Date  . BPH (benign prostatic hyperplasia)   . GERD   . HERPES SIMPLEX, UNCOMPLICATED   . LUMBAR RADICULOPATHY, LEFT   . Other and unspecified hyperlipidemia   . Other chronic sinusitis   . Other testicular hypofunction   . PANIC ATTACK, ACUTE   . Cervical pain (neck)   . Fatty liver   . Diverticulosis   . BCC (basal cell carcinoma of skin) 03-2013    scalp  . Irritable bowel syndrome (IBS)     WITH DIARRHEA  . 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 Delice Lesch  . Headache   . Anxiety     Past Surgical History  Procedure Laterality Date  . Hernia repair      Inguinal  . Orchiectomy Right ~1995    benign  . Back surgery  2008  . Ganglion cyst excision      finger  . Colonoscopy    . Tonsillectomy    . Birth mark removal      as baby  . Spine surgery      Social History   Social History  . Marital Status: Single    Spouse Name: N/A  . Number of Children: 0  . Years of Education: HS   Occupational  History  . furniture sales     Social History Main Topics  . Smoking status: Never Smoker   . Smokeless tobacco: Never Used  . Alcohol Use: 0.0 oz/week    0 Standard drinks or equivalent per week     Comment: Approximately once a month  . Drug Use: No  . Sexual Activity: Yes   Other Topics Concern  . Not on file   Social History Narrative   Lives w/ partner Jon Richardson.   Right-handed.   2 cups coffee daily.        Medication List       This list is accurate as of: 06/03/15 11:59 PM.  Always use your most recent med list.               ALPRAZolam 1 MG tablet  Commonly known as:  XANAX  Take 0.5-1 tablets (0.5-1 mg total) by mouth daily as needed.     aspirin 81 MG tablet  Take 81 mg by mouth daily.     celecoxib 100 MG capsule  Commonly known as:  CELEBREX  Take 1 capsule (100 mg total) by mouth 2 (two) times daily.     cimetidine 200 MG  tablet  Commonly known as:  TAGAMET  Take 200 mg by mouth 2 (two) times daily.     FLUoxetine 20 MG tablet  Commonly known as:  PROZAC  Take 1 tablet (20 mg total) by mouth daily.     GLUCOSAMINE 1500 COMPLEX PO  Take 1 tablet by mouth daily.     HYDROcodone-acetaminophen 10-325 MG tablet  Commonly known as:  NORCO  Take 1 tablet by mouth every 6 (six) hours as needed. Reported on 06/03/2015     pantoprazole 40 MG tablet  Commonly known as:  PROTONIX  Take 1 tablet (40 mg total) by mouth daily.     propranolol 80 MG tablet  Commonly known as:  INDERAL  Take 1 tablet (80 mg total) by mouth 2 (two) times daily.     RED YEAST RICE PO  Take 2,400 mg by mouth daily.     SUPER B COMPLEX PO  Take 2 tablets by mouth daily.     valACYclovir 500 MG tablet  Commonly known as:  VALTREX  Take 1 tablet (500 mg total) by mouth daily.     Vitamin D3 5000 units Caps  Take 1 capsule by mouth daily.           Objective:   Physical Exam BP 122/76 mmHg  Pulse 75  Temp(Src) 97.9 F (36.6 C) (Oral)  Ht 5\' 7"  (1.702 m)  Wt  184 lb 4 oz (83.575 kg)  BMI 28.85 kg/m2  SpO2 99% General:   Well developed, well nourished . NAD.  HEENT:  Normocephalic . Face symmetric, atraumatic Lungs:  CTA B Normal respiratory effort, no intercostal retractions, no accessory muscle use. Chest wall: No TTP  Heart: RRR,  no murmur.  no pretibial edema bilaterally. Calves symmetric Abdomen:  Not distended, soft, non-tender. No rebound or rigidity. No mass,organomegaly Skin: Not pale. Not jaundice Neurologic:  alert & oriented X3.  Speech normal, gait appropriate for age and unassisted Psych--  Cognition and judgment appear intact.  Cooperative with normal attention span and concentration.  Behavior appropriate. Apprehensive but no depressed appearing.    Assessment & Plan:   Assessment prediabetes, A1c 6.0 (08/2014) Anxiety, panic attacks (self dc prozac 10-2014 b/c "feeling weird", intolerant to other meds cymbalta, lexapro, "several") GI: --IBS, GERD, Fatty liver GU: --BPH --H/o low testosterone MSK: Chronic back pain after surgery SKIN: --BCC, scalp 2013 --Melanoma, right leg, 12-2014 H/o of testosterone  Plan 06-03-2015 Chest pain: 55 year old gentleman prediabetes, moderately elevated cholesterol presents with chest pain, atypical, EKG normal few days ago, no risk factors for PE. For completeness recommend a chest x-ray, has an appoint w/ cards next week Anxiety: He again reports anxiety mostly associated with physical symptoms. States that is definitely intolerant to SSRIs or similar medications. He "feels okay" taking Xanax and wonders if that is okay. I told patient that Xanax is potentially addictive but in light of the other intolerances there is no many alternative except may be clonazepam or BuSpar. Counseling is also indicated and I do recommend that. Ocular  Migraines: Has not to start beta blockers were prevention, I explained him the reasons behind prescribed a beta blocker in this setting. Also I will  be able to help him   switch to another PCP (see comments under OV @ [facility], not satisfied w/ my services), he will let me know  RTC 6 weeks, fasting. Recheck FLP    PREVIOUS PLANS Plan 05-11-2015 Prediabetes: A1c few days ago stable at 6.1. Diet and  exercise discussed. Mild dyslipidemia with increase triglycerides: Check FLP Visual disturbances: 3 similar episodes in the last 2 weeks. 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 Already saw an eye doctor and a carotid ultrasound was (-). Reluctant to get MRI. Recommend sed rate and neuro referral  Back pain : Occasionally uses a hydrocodone, contract signed. Okay to try Celebrex 100 twice a day as needed. Anxiety: Well-controlled with Xanax as needed RTC 08/2015

## 2015-06-08 ENCOUNTER — Ambulatory Visit (INDEPENDENT_AMBULATORY_CARE_PROVIDER_SITE_OTHER): Payer: BLUE CROSS/BLUE SHIELD | Admitting: Cardiovascular Disease

## 2015-06-08 ENCOUNTER — Encounter: Payer: Self-pay | Admitting: Cardiovascular Disease

## 2015-06-08 ENCOUNTER — Encounter: Payer: Self-pay | Admitting: Family Medicine

## 2015-06-08 VITALS — BP 132/94 | HR 64 | Ht 67.0 in | Wt 181.2 lb

## 2015-06-08 DIAGNOSIS — E785 Hyperlipidemia, unspecified: Secondary | ICD-10-CM

## 2015-06-08 DIAGNOSIS — R0789 Other chest pain: Secondary | ICD-10-CM

## 2015-06-08 DIAGNOSIS — R079 Chest pain, unspecified: Secondary | ICD-10-CM | POA: Insufficient documentation

## 2015-06-08 MED ORDER — ROSUVASTATIN CALCIUM 5 MG PO TABS: 5.0000 mg | ORAL_TABLET | Freq: Every day | ORAL | Status: DC

## 2015-06-08 NOTE — Patient Instructions (Signed)
Medication Instructions:  START Rosuvastatin (Crestor) 5 mg once daily   Labwork: Your physician recommends that you return for lab work in: 3 months on the day of or a few days before your office visit with Dr. Acie Fredrickson.  You will need to FAST for this appointment - nothing to eat or drink after midnight the night before except water.   Testing/Procedures: None Ordered   Follow-Up: Your physician recommends that you schedule a follow-up appointment in: 3 months with Dr. Acie Fredrickson.    If you need a refill on your cardiac medications before your next appointment, please call your pharmacy.   Thank you for choosing [facility]! Christen Bame, RN [phone]

## 2015-06-08 NOTE — Progress Notes (Signed)
Cardiology Office Note   Date:  06/08/2015   ID:  Jon Hipp., DOB 08/29/1960, MRN [id number]  PCP:  Kathlene November, MD  Cardiologist:   Acie Fredrickson Wonda Cheng, MD   Chief Complaint  Patient presents with  . Chest Pain   Problem list 1. Chest pain  2. Hyperlipidemia   History of Present Illness: Jon Ottaviani Grace Brooke Bonito. is a 55 y.o. male who presents for evaluation of some episodes of chest pain. Has been having occular migrane HA since  Christmas.   Has had lots of anxiety since that time and now has been having CP CP last for a few minutes, perhaps longer.  Not associated with dyspena,   No pre-syncope Occasionally has some left arm pain with walking .  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  Has had a stress myoview ~ 2001 in [place] which was normal .  Has a hx of hyperlipidemia - has tried several statins ( atorvastatin and Simvastatin )  but those caused muscle pains . Has been on Red Yeast Rice since then  Glucose levels have been elevated - has cut back on his sugar.   Has limited his caffiene.     Past Medical History  Diagnosis Date  . BPH (benign prostatic hyperplasia)   . GERD   . HERPES SIMPLEX, UNCOMPLICATED   . LUMBAR RADICULOPATHY, LEFT   . Other and unspecified hyperlipidemia   . Other chronic sinusitis   . Other testicular hypofunction   . PANIC ATTACK, ACUTE   . Cervical pain (neck)   . Fatty liver   . Diverticulosis   . BCC (basal cell carcinoma of skin) 03-2013    scalp  . Irritable bowel syndrome (IBS)     WITH DIARRHEA  . 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 Delice Lesch  . Headache   . Anxiety     Past Surgical History  Procedure Laterality Date  . Hernia repair       Inguinal  . Orchiectomy Right ~1995    benign  . Back surgery  2008  . Ganglion cyst excision      finger  . Colonoscopy    . Tonsillectomy    . Birth mark removal      as baby  . Spine surgery       Current Outpatient Prescriptions  Medication Sig Dispense Refill  . ALPRAZolam (XANAX) 1 MG tablet Take 0.5-1 tablets (0.5-1 mg total) by mouth daily as needed. 30 tablet 3  . aspirin 81 MG tablet Take 81 mg by mouth daily.    . B Complex-C (SUPER B COMPLEX PO) Take 2 tablets by mouth daily.    . Cholecalciferol (VITAMIN D3) 5000 UNITS CAPS Take 1 capsule by mouth daily.    . cimetidine (TAGAMET) 200 MG tablet Take 200 mg by mouth 2 (two) times daily.    . Glucosamine-Chondroit-Vit C-Mn (GLUCOSAMINE 1500 COMPLEX PO) Take 1 tablet by mouth daily.    Marland Kitchen HYDROcodone-acetaminophen (NORCO) 10-325 MG tablet Take 1 tablet by mouth every 6 (six) hours as needed. Reported on 06/03/2015    . pantoprazole (PROTONIX) 40 MG tablet Take 1 tablet (40 mg  total) by mouth daily. 30 tablet 12  . Red Yeast Rice Extract (RED YEAST RICE PO) Take 2,400 mg by mouth daily.    . valACYclovir (VALTREX) 500 MG tablet Take 1 tablet (500 mg total) by mouth daily. 30 tablet 5  . celecoxib (CELEBREX) 100 MG capsule Take 1 capsule (100 mg total) by mouth 2 (two) times daily. (Patient not taking: Reported on 06/03/2015) 60 capsule 1  . propranolol (INDERAL) 80 MG tablet Take 1 tablet (80 mg total) by mouth 2 (two) times daily. (Patient not taking: Reported on 06/03/2015) 60 tablet 11   No current facility-administered medications for this visit.    Allergies:   Erythromycin    Social History:  The patient  reports that he has never smoked. He has never used smokeless tobacco. He reports that he drinks alcohol. He reports that he does not use illicit drugs.   Family History:  The patient's family history includes Cancer in his maternal uncle; Colon cancer in his paternal grandmother; Heart attack in his paternal  grandfather; Heart disease in his maternal grandmother and mother; Lung cancer in his father; Prostate cancer in his father and paternal uncle. There is no history of Esophageal cancer, Rectal cancer, or Stomach cancer.    ROS:  Please see the history of present illness.    Review of Systems: Constitutional:  denies fever, chills, diaphoresis, appetite change and fatigue.  HEENT: denies photophobia, eye pain, redness, hearing loss, ear pain, congestion, sore throat, rhinorrhea, sneezing, neck pain, neck stiffness and tinnitus.  Respiratory: denies SOB, DOE, cough, chest tightness, and wheezing.  Cardiovascular: denies chest pain, palpitations and leg swelling.  Gastrointestinal: denies nausea, vomiting, abdominal pain, diarrhea, constipation, blood in stool.  Genitourinary: denies dysuria, urgency, frequency, hematuria, flank pain and difficulty urinating.  Musculoskeletal: denies  myalgias, back pain, joint swelling, arthralgias and gait problem.   Skin: denies pallor, rash and wound.  Neurological: denies dizziness, seizures, syncope, weakness, light-headedness, numbness and headaches.   Hematological: denies adenopathy, easy bruising, personal or family bleeding history.  Psychiatric/ Behavioral: denies suicidal ideation, mood changes, confusion, nervousness, sleep disturbance and agitation.       All other systems are reviewed and negative.    PHYSICAL EXAM: VS:  BP 132/94 mmHg  Pulse 64  Ht 5\' 7"  (1.702 m)  Wt 181 lb 3.2 oz (82.192 kg)  BMI 28.37 kg/m2 , BMI Body mass index is 28.37 kg/(m^2). GEN: Well nourished, well developed, in no acute distress HEENT: normal Neck: no JVD, carotid bruits, or masses Cardiac: RRR; no murmurs, rubs, or gallops,no edema  Respiratory:  clear to auscultation bilaterally, normal work of breathing GI: soft, nontender, nondistended, + BS MS: no deformity or atrophy Skin: warm and dry, no rash Neuro:  Strength and sensation are intact Psych:  normal   EKG:  EKG is not ordered today. The ekg ordered Jan. 31, 2017   demonstrates NSR    Recent Labs: 04/23/2015: Hemoglobin 14.6; Platelets 314.0; TSH 3.49 05/18/2015: BUN 19; Creatinine, Ser 1.06; Potassium 4.0; Sodium 140    Lipid Panel    Component Value Date/Time   CHOL 211* 05/11/2015 1113   TRIG 323.0* 05/11/2015 1113   HDL 39.40 05/11/2015 1113   CHOLHDL 5 05/11/2015 1113   VLDL 64.6* 05/11/2015 1113   LDLCALC 106* 07/24/2013 0849   LDLDIRECT 121.0 05/11/2015 1113      Wt Readings from Last 3 Encounters:  06/08/15 181 lb 3.2 oz (82.192 kg)  06/03/15 184 lb 4 oz (83.575  kg)  05/26/15 185 lb (83.915 kg)      Other studies Reviewed: Additional studies/ records that were reviewed today include: . Review of the above records demonstrates:    ASSESSMENT AND PLAN:  1.  Chest pain :   His pains are atypical.   I have recommended that he start a regular exercise program and try to advance with his exercise.  If he has any chest pressure, I have advised him to call us right away. Otherwise, I'll see him in 3 months.  Will check fasting lipids   2. Hyperlipidemia:  He has tried simva and Atorva and had muscle cramps Will try Rosuvastatin 5 mg a day and see if he tolerates that better .    Current medicines are reviewed at length with the patient today.  The patient does not have concerns regarding medicines.  The following changes have been made:  Add rosuvastatin 5 mg a day   Labs/ tests ordered today include:  No orders of the defined types were placed in this encounter.     Disposition:   FU with me in 3 months      Nahser, Wonda Cheng, MD  06/08/2015 11:53 AM    [facility] [place], [place], [place]  [address] Phone: [phone]; Fax: [phone]   [facility]  [address] [place], [place]  [address] [phone]   Fax [phone]

## 2015-06-10 ENCOUNTER — Other Ambulatory Visit: Payer: BLUE CROSS/BLUE SHIELD

## 2015-06-24 ENCOUNTER — Ambulatory Visit: Payer: BLUE CROSS/BLUE SHIELD | Admitting: Neurology

## 2015-07-03 ENCOUNTER — Encounter: Payer: Self-pay | Admitting: Family Medicine

## 2015-07-03 ENCOUNTER — Ambulatory Visit (INDEPENDENT_AMBULATORY_CARE_PROVIDER_SITE_OTHER): Payer: BLUE CROSS/BLUE SHIELD | Admitting: Family Medicine

## 2015-07-03 VITALS — BP 130/80 | HR 68 | Temp 98.1°F | Resp 16 | Ht 67.0 in | Wt 175.4 lb

## 2015-07-03 DIAGNOSIS — F419 Anxiety disorder, unspecified: Secondary | ICD-10-CM

## 2015-07-03 MED ORDER — BUSPIRONE HCL 15 MG PO TABS: 15.0000 mg | ORAL_TABLET | Freq: Two times a day (BID) | ORAL | Status: DC

## 2015-07-03 MED ORDER — TRAZODONE HCL 50 MG PO TABS: 25.0000 mg | ORAL_TABLET | Freq: Every evening | ORAL | Status: DC | PRN

## 2015-07-03 NOTE — Progress Notes (Signed)
Pre visit review using our clinic review tool, if applicable. No additional management support is needed unless otherwise documented below in the visit note. 

## 2015-07-03 NOTE — Assessment & Plan Note (Signed)
Deteriorated.  Pt's anxiety is worse than previous and now causing physical manifestations and insomnia.  Start Buspar twice daily for anxiety control.  Trazodone for sleep.  Will titrate as needed and may need to add Lexapro at future visits.  Will follow closely.

## 2015-07-03 NOTE — Progress Notes (Signed)
   Subjective:    Patient ID: Jon Hipp., male    DOB: Feb 01, 1961, 55 y.o.   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  Has occurred on 5 separate times.  Will get very anxious during these episodes and have palpitations and muscular CP radiating to his back.  States this is not stress related- denies changes to work or home situation.  Feels there is something 'medical going on'.  Taking Xanax w/ improvement but doesn't want to rely on this.  Currently taking xanax daily.  Denies SOB, has eliminated caffeine, walking 3 miles/day.  Has improved diet recently.  Has been on beta blocker but had side effects.  Felt worse on Prozac.  Has taken Zoloft previously.     Review of Systems For ROS see HPI     Objective:   Physical Exam  Constitutional: He is oriented to person, place, and time. He appears well-developed and well-nourished. No distress.  HENT:  Head: Normocephalic and atraumatic.  Eyes: Conjunctivae and EOM are normal. Pupils are equal, round, and reactive to light.  Neurological: He is alert and oriented to person, place, and time.  Skin: Skin is warm and dry.  Psychiatric: He has a normal mood and affect. His behavior is normal. Thought content normal.  Vitals reviewed.         Assessment & Plan:

## 2015-07-03 NOTE — Patient Instructions (Signed)
Follow up in 1 month to recheck mood and cholesterol Start the Buspar- 1/2 tab twice daily x1 week and then 1 tab twice daily Use the trazodone at night- 1/2 tab at first and then increase to full tab Work on your communication- acknowledge your anxiety and talk about it Call with any questions or concerns If you want to join [place] at the new [place] office, any scheduled appointments will automatically transfer and we will see you at [address] [place] [address] [place], [place] [address] ([place] 3/23) [place] in there!!!

## 2015-07-22 ENCOUNTER — Ambulatory Visit: Payer: BLUE CROSS/BLUE SHIELD | Admitting: Internal Medicine

## 2015-07-22 ENCOUNTER — Ambulatory Visit: Payer: BLUE CROSS/BLUE SHIELD | Admitting: Family Medicine

## 2015-08-11 ENCOUNTER — Ambulatory Visit (INDEPENDENT_AMBULATORY_CARE_PROVIDER_SITE_OTHER): Payer: BLUE CROSS/BLUE SHIELD | Admitting: Family Medicine

## 2015-08-11 ENCOUNTER — Encounter: Payer: Self-pay | Admitting: Family Medicine

## 2015-08-11 VITALS — BP 128/81 | HR 56 | Temp 98.0°F | Resp 16 | Ht 67.0 in | Wt 175.5 lb

## 2015-08-11 DIAGNOSIS — F419 Anxiety disorder, unspecified: Secondary | ICD-10-CM

## 2015-08-11 DIAGNOSIS — E785 Hyperlipidemia, unspecified: Secondary | ICD-10-CM

## 2015-08-11 LAB — BASIC METABOLIC PANEL
BUN: 18 mg/dL (ref 6–23)
CHLORIDE: 103 meq/L (ref 96–112)
CO2: 32 mEq/L (ref 19–32)
Calcium: 9.7 mg/dL (ref 8.4–10.5)
Creatinine, Ser: 1.01 mg/dL (ref 0.40–1.50)
GFR: 81.52 mL/min (ref >60.00)
Glucose, Bld: 87 mg/dL (ref 70–99)
POTASSIUM: 4.2 meq/L (ref 3.5–5.1)
Sodium: 139 mEq/L (ref 135–145)

## 2015-08-11 LAB — LIPID PANEL
CHOLESTEROL: 194 mg/dL (ref 0–200)
HDL: 44.7 mg/dL (ref >39.00)
LDL Cholesterol: 118 mg/dL — ABNORMAL HIGH (ref 0–99)
NonHDL: 149.6
TRIGLYCERIDES: 160 mg/dL — AB (ref 0.0–149.0)
Total CHOL/HDL Ratio: 4
VLDL: 32 mg/dL (ref 0.0–40.0)

## 2015-08-11 LAB — HEPATIC FUNCTION PANEL
ALT: 15 U/L (ref 0–53)
AST: 15 U/L (ref 0–37)
Albumin: 4.4 g/dL (ref 3.5–5.2)
Alkaline Phosphatase: 33 U/L — ABNORMAL LOW (ref 39–117)
BILIRUBIN DIRECT: 0.1 mg/dL (ref 0.0–0.3)
BILIRUBIN TOTAL: 0.6 mg/dL (ref 0.2–1.2)
TOTAL PROTEIN: 7.1 g/dL (ref 6.0–8.3)

## 2015-08-11 LAB — TSH: TSH: 3.14 u[IU]/mL (ref 0.35–4.50)

## 2015-08-11 MED ORDER — ESCITALOPRAM OXALATE 10 MG PO TABS: 10.0000 mg | ORAL_TABLET | Freq: Every day | ORAL | Status: DC

## 2015-08-11 MED ORDER — ALPRAZOLAM 1 MG PO TABS: 0.5000 mg | ORAL_TABLET | Freq: Every day | ORAL | Status: DC | PRN

## 2015-08-11 NOTE — Assessment & Plan Note (Signed)
Ongoing issue for pt.  Buspar was effective until he linked this to palpitations- which he admits is not likely the case and that his palpitations are more likely anxiety than the medication.  But since he has made this correlation, will stop the Buspar and switch to Lexapro for baseline anxiety control.  Reviewed supportive care and red flags that should prompt return.  Pt expressed understanding and is in agreement w/ plan.

## 2015-08-11 NOTE — Progress Notes (Signed)
Pre visit review using our clinic review tool, if applicable. No additional management support is needed unless otherwise documented below in the visit note. 

## 2015-08-11 NOTE — Assessment & Plan Note (Signed)
Ongoing issue.  Attempting to control w/ Red Yeast Rice and diet and exercise.  No longer on Crestor.  Will repeat labs today and adjust tx prn.

## 2015-08-11 NOTE — Progress Notes (Signed)
   Subjective:    Patient ID: Jon Hipp., male    DOB: 1960-08-22, 55 y.o.   MRN: [id number]  HPI Anxiety- pt reports Buspar was effective x2 months- much fewer anxiety attacks and rarely required Xanax- but then developed palpitations.  Stopped caffeine and eliminated sugar.  Did not take Buspar last night due to thought that medication caused his palpitations.  Pt reports doing well on alprazolam (1/2 tab prn).  Partner and mother still feel he needs to be on something for his baseline anxiety  Hyperlipidemia- chronic problem.  Pt is not taking Crestor.  On Red Yeast Rice and working on dietary modification.     Review of Systems For ROS see HPI     Objective:   Physical Exam  Constitutional: He is oriented to person, place, and time. He appears well-developed and well-nourished. No distress.  HENT:  Head: Normocephalic and atraumatic.  Eyes: Conjunctivae and EOM are normal. Pupils are equal, round, and reactive to light.  Neck: Normal range of motion. Neck supple. No thyromegaly present.  Cardiovascular: Normal rate, regular rhythm, normal heart sounds and intact distal pulses.   No murmur heard. Pulmonary/Chest: Effort normal and breath sounds normal. No respiratory distress.  Abdominal: Soft. Bowel sounds are normal. He exhibits no distension.  Musculoskeletal: He exhibits no edema.  Lymphadenopathy:    He has no cervical adenopathy.  Neurological: He is alert and oriented to person, place, and time. No cranial nerve deficit.  Skin: Skin is warm and dry.  Psychiatric: He has a normal mood and affect. His behavior is normal.  Vitals reviewed.         Assessment & Plan:

## 2015-08-11 NOTE — Patient Instructions (Signed)
Follow up by phone or MyChart in 1 month to reassess anxiety and Lexapro We'll notify you of your lab results and make any changes if needed Keep up the good work on healthy diet and regular exercise- you look great! Start the Lexapro once daily Continue to use the Alprazolam as needed for high anxiety moments Call with any questions or concerns Hang in there!!! Happy Early Rudene And[place]!!!

## 2015-09-08 ENCOUNTER — Encounter: Payer: Self-pay | Admitting: Family Medicine

## 2015-09-09 ENCOUNTER — Other Ambulatory Visit: Payer: Self-pay | Admitting: Family Medicine

## 2015-09-09 ENCOUNTER — Other Ambulatory Visit (INDEPENDENT_AMBULATORY_CARE_PROVIDER_SITE_OTHER): Payer: BLUE CROSS/BLUE SHIELD

## 2015-09-09 ENCOUNTER — Other Ambulatory Visit: Payer: BLUE CROSS/BLUE SHIELD

## 2015-09-09 DIAGNOSIS — E291 Testicular hypofunction: Secondary | ICD-10-CM

## 2015-09-09 LAB — TESTOSTERONE: Testosterone: 164.57 ng/dL — ABNORMAL LOW (ref 300.00–890.00)

## 2015-09-10 ENCOUNTER — Other Ambulatory Visit: Payer: Self-pay | Admitting: General Practice

## 2015-09-10 MED ORDER — SYRINGE (DISPOSABLE) 3 ML MISC: Status: DC

## 2015-09-10 MED ORDER — TESTOSTERONE CYPIONATE 200 MG/ML IM SOLN: 200.0000 mg | INTRAMUSCULAR | Status: DC

## 2015-09-10 MED ORDER — "HYPODERMIC NEEDLE 23G X 1"" MISC": Status: DC

## 2015-09-14 ENCOUNTER — Encounter: Payer: BLUE CROSS/BLUE SHIELD | Admitting: Family Medicine

## 2015-09-15 ENCOUNTER — Other Ambulatory Visit: Payer: Self-pay | Admitting: General Practice

## 2015-09-15 LAB — TESTOSTERONE, FREE AND TOTAL (INCLUDES SHBG)-(MALES)
SEX HORMONE BINDING: 14 nmol/L (ref 10–50)
TESTOSTERONE FREE: 53.9 pg/mL (ref 47.0–244.0)
Testosterone-% Free: 2.8 % (ref 1.6–2.9)
Testosterone: 190 ng/dL — ABNORMAL LOW (ref 250–827)

## 2015-09-15 MED ORDER — "NEEDLE (DISP) 18G X 1"" MISC": Status: DC

## 2015-09-16 ENCOUNTER — Ambulatory Visit: Payer: BLUE CROSS/BLUE SHIELD | Admitting: Cardiovascular Disease

## 2015-09-22 ENCOUNTER — Encounter: Payer: BLUE CROSS/BLUE SHIELD | Admitting: Internal Medicine

## 2015-09-29 ENCOUNTER — Encounter: Payer: BLUE CROSS/BLUE SHIELD | Admitting: Internal Medicine

## 2015-10-12 ENCOUNTER — Encounter: Payer: Self-pay | Admitting: Family Medicine

## 2015-10-12 ENCOUNTER — Ambulatory Visit (INDEPENDENT_AMBULATORY_CARE_PROVIDER_SITE_OTHER): Payer: BLUE CROSS/BLUE SHIELD | Admitting: Family Medicine

## 2015-10-12 VITALS — BP 120/78 | HR 60 | Temp 98.1°F | Resp 16 | Ht 67.0 in | Wt 178.0 lb

## 2015-10-12 DIAGNOSIS — Z0000 Encounter for general adult medical examination without abnormal findings: Secondary | ICD-10-CM

## 2015-10-12 LAB — HEPATIC FUNCTION PANEL
ALBUMIN: 4.5 g/dL (ref 3.5–5.2)
ALT: 14 U/L (ref 0–53)
AST: 14 U/L (ref 0–37)
Alkaline Phosphatase: 35 U/L — ABNORMAL LOW (ref 39–117)
Bilirubin, Direct: 0.1 mg/dL (ref 0.0–0.3)
TOTAL PROTEIN: 6.9 g/dL (ref 6.0–8.3)
Total Bilirubin: 0.7 mg/dL (ref 0.2–1.2)

## 2015-10-12 LAB — CBC WITH DIFFERENTIAL/PLATELET
BASOS ABS: 0 10*3/uL (ref 0.0–0.1)
Basophils Relative: 0.5 % (ref 0.0–3.0)
EOS ABS: 0.1 10*3/uL (ref 0.0–0.7)
Eosinophils Relative: 1.5 % (ref 0.0–5.0)
HCT: 44.7 % (ref 39.0–52.0)
Hemoglobin: 15 g/dL (ref 13.0–17.0)
LYMPHS ABS: 2.1 10*3/uL (ref 0.7–4.0)
Lymphocytes Relative: 33.2 % (ref 12.0–46.0)
MCHC: 33.5 g/dL (ref 30.0–36.0)
MCV: 93.7 fl (ref 78.0–100.0)
MONO ABS: 0.4 10*3/uL (ref 0.1–1.0)
Monocytes Relative: 7.1 % (ref 3.0–12.0)
NEUTROS PCT: 57.7 % (ref 43.0–77.0)
Neutro Abs: 3.6 10*3/uL (ref 1.4–7.7)
Platelets: 313 10*3/uL (ref 150.0–400.0)
RBC: 4.78 Mil/uL (ref 4.22–5.81)
RDW: 15.2 % (ref 11.5–15.5)
WBC: 6.2 10*3/uL (ref 4.0–10.5)

## 2015-10-12 LAB — BASIC METABOLIC PANEL
BUN: 18 mg/dL (ref 6–23)
CHLORIDE: 103 meq/L (ref 96–112)
CO2: 27 mEq/L (ref 19–32)
Calcium: 9.7 mg/dL (ref 8.4–10.5)
Creatinine, Ser: 1.06 mg/dL (ref 0.40–1.50)
GFR: 77.05 mL/min (ref >60.00)
GLUCOSE: 104 mg/dL — AB (ref 70–99)
POTASSIUM: 4.3 meq/L (ref 3.5–5.1)
SODIUM: 138 meq/L (ref 135–145)

## 2015-10-12 LAB — TSH: TSH: 3.57 u[IU]/mL (ref 0.35–4.50)

## 2015-10-12 LAB — PSA: PSA: 0.63 ng/mL (ref 0.10–4.00)

## 2015-10-12 NOTE — Progress Notes (Signed)
Pre visit review using our clinic review tool, if applicable. No additional management support is needed unless otherwise documented below in the visit note. 

## 2015-10-12 NOTE — Patient Instructions (Signed)
Follow up in 1 year or as needed We'll notify you of your lab results and make any changes if needed Keep up the good work on healthy diet and regular exercise- you look great! Call with any questions or concerns Have a great summer!!! 

## 2015-10-12 NOTE — Assessment & Plan Note (Signed)
Pt's PE WNL.  UTD on colonoscopy.  Check labs.  Anticipatory guidance provided.  

## 2015-10-12 NOTE — Progress Notes (Signed)
   Subjective:    Patient ID: Alden Hipp., male    DOB: Feb 02, 1961, 55 y.o.   MRN: [id number]  HPI CPE- UTD on colonoscopy (due 2024).  UTD on Tdap.     Review of Systems Patient reports no vision/hearing changes, anorexia, fever ,adenopathy, persistant/recurrent hoarseness, swallowing issues, chest pain, palpitations, edema, persistant/recurrent cough, hemoptysis, dyspnea (rest,exertional, paroxysmal nocturnal), gastrointestinal  bleeding (melena, rectal bleeding), abdominal pain, excessive heart burn, GU symptoms (dysuria, hematuria, voiding/incontinence issues) syncope, focal weakness, memory loss, numbness & tingling, skin/hair/nail changes, depression, anxiety, abnormal bruising/bleeding, musculoskeletal symptoms/signs.     Objective:   Physical Exam BP 120/78 mmHg  Pulse 60  Temp(Src) 98.1 F (36.7 C) (Oral)  Resp 16  Ht 5\' 7"  (1.702 m)  Wt 178 lb (80.74 kg)  BMI 27.87 kg/m2  SpO2 98%  General Appearance:    Alert, cooperative, no distress, appears stated age  Head:    Normocephalic, without obvious abnormality, atraumatic  Eyes:    PERRL, conjunctiva/corneas clear, EOM's intact, fundi    benign, both eyes       Ears:    Normal TM's and external ear canals, both ears  Nose:   Nares normal, septum midline, mucosa normal, no drainage   or sinus tenderness  Throat:   Lips, mucosa, and tongue normal; teeth and gums normal  Neck:   Supple, symmetrical, trachea midline, no adenopathy;       thyroid:  No enlargement/tenderness/nodules  Back:     Symmetric, no curvature, ROM normal, no CVA tenderness  Lungs:     Clear to auscultation bilaterally, respirations unlabored  Chest wall:    No tenderness or deformity  Heart:    Regular rate and rhythm, S1 and S2 normal, no murmur, rub   or gallop  Abdomen:     Soft, non-tender, bowel sounds active all four quadrants,    no masses, no organomegaly  Genitalia:    Normal male without lesion, discharge or tenderness  Rectal:     Normal tone, normal prostate, no masses or tenderness  Extremities:   Extremities normal, atraumatic, no cyanosis or edema  Pulses:   2+ and symmetric all extremities  Skin:   Skin color, texture, turgor normal, no rashes or lesions  Lymph nodes:   Cervical, supraclavicular, and axillary nodes normal  Neurologic:   CNII-XII intact. Normal strength, sensation and reflexes      throughout          Assessment & Plan:

## 2015-11-04 ENCOUNTER — Other Ambulatory Visit: Payer: Self-pay | Admitting: General Practice

## 2015-11-04 MED ORDER — VALACYCLOVIR HCL 500 MG PO TABS: 500.0000 mg | ORAL_TABLET | Freq: Every day | ORAL | Status: DC

## 2015-11-06 ENCOUNTER — Other Ambulatory Visit: Payer: Self-pay | Admitting: General Practice

## 2015-11-06 DIAGNOSIS — K219 Gastro-esophageal reflux disease without esophagitis: Secondary | ICD-10-CM

## 2015-11-06 MED ORDER — PANTOPRAZOLE SODIUM 40 MG PO TBEC: 40.0000 mg | DELAYED_RELEASE_TABLET | Freq: Every day | ORAL | Status: DC

## 2015-11-25 ENCOUNTER — Other Ambulatory Visit: Payer: Self-pay | Admitting: General Practice

## 2015-11-25 MED ORDER — TESTOSTERONE CYPIONATE 200 MG/ML IM SOLN: 200.0000 mg | INTRAMUSCULAR | 3 refills | Status: DC

## 2015-11-30 ENCOUNTER — Telehealth: Payer: Self-pay | Admitting: Family Medicine

## 2015-12-01 ENCOUNTER — Ambulatory Visit (INDEPENDENT_AMBULATORY_CARE_PROVIDER_SITE_OTHER): Payer: BLUE CROSS/BLUE SHIELD | Admitting: Family Medicine

## 2015-12-01 ENCOUNTER — Encounter: Payer: Self-pay | Admitting: Family Medicine

## 2015-12-01 VITALS — BP 112/74 | HR 76 | Temp 98.8°F | Resp 16 | Ht 67.0 in | Wt 178.4 lb

## 2015-12-01 DIAGNOSIS — J302 Other seasonal allergic rhinitis: Secondary | ICD-10-CM | POA: Diagnosis not present

## 2015-12-01 MED ORDER — CETIRIZINE HCL 10 MG PO TABS: 10.0000 mg | ORAL_TABLET | Freq: Every day | ORAL | 11 refills | Status: DC

## 2015-12-01 MED ORDER — FLUTICASONE PROPIONATE 50 MCG/ACT NA SUSP
2.0000 | Freq: Every day | NASAL | 6 refills | Status: AC
Start: 2015-12-01 — End: ?

## 2015-12-01 NOTE — Progress Notes (Signed)
   Subjective:    Patient ID: Jon Hipp., male    DOB: [birth year], 55 y.o.   MRN: [id number]  HPI URI- pt reports he developed drainage in June and 'it's not getting any better'.  Intermittent hoarseness.  Denies sore throat but dry mouth.  + PND.  No ear pain/pressure.  Using Netti pot but has not used in ~2 weeks.  No fevers.  Not currently taking allergy medication.   Review of Systems For ROS see HPI     Objective:   Physical Exam  Constitutional: He appears well-developed and well-nourished. No distress.  HENT:  Head: Normocephalic and atraumatic.  No TTP over sinuses + turbinate edema + PND TMs normal bilaterally  Eyes: Conjunctivae and EOM are normal. Pupils are equal, round, and reactive to light.  Neck: Normal range of motion. Neck supple.  Cardiovascular: Normal rate, regular rhythm and normal heart sounds.   Pulmonary/Chest: Effort normal and breath sounds normal. No respiratory distress. He has no wheezes.  Lymphadenopathy:    He has no cervical adenopathy.  Skin: Skin is warm and dry.  Vitals reviewed.         Assessment & Plan:  Allergic rhinitis- new.  Pt's sxs and PE consistent w/ allergy congestion and PND.  Start daily Zyrtec and Flonase.  Reviewed supportive care and red flags that should prompt return.  Pt expressed understanding and is in agreement w/ plan.

## 2015-12-01 NOTE — Patient Instructions (Signed)
Follow up as needed Start the Zyrtec daily Use the Flonase- 2 sprays each nostril once daily Drink plenty of fluids Call with any questions or concerns Hang in there!!!

## 2015-12-01 NOTE — Progress Notes (Signed)
Pre visit review using our clinic review tool, if applicable. No additional management support is needed unless otherwise documented below in the visit note. 

## 2015-12-09 ENCOUNTER — Encounter: Payer: Self-pay | Admitting: Family Medicine

## 2015-12-09 ENCOUNTER — Other Ambulatory Visit: Payer: Self-pay | Admitting: Family Medicine

## 2015-12-09 DIAGNOSIS — E291 Testicular hypofunction: Secondary | ICD-10-CM

## 2015-12-09 NOTE — Progress Notes (Signed)
Referral placed and pt informed on mychart.

## 2015-12-10 ENCOUNTER — Telehealth: Payer: Self-pay | Admitting: Emergency Medicine

## 2015-12-10 NOTE — Telephone Encounter (Signed)
Patient called today requesting to change the referral for the Endocrinology. He was referred to [facility] Endocr[facility]. He didn't want to go there due to low ratings. He gave another Endocrinologist he wanted to see. Delrae Rend Endocrinology with [facility]. [address], [place], [place] [address] [phone], fax [phone]

## 2015-12-16 ENCOUNTER — Telehealth: Payer: Self-pay | Admitting: Family Medicine

## 2015-12-16 NOTE — Telephone Encounter (Signed)
You can refer to [facility] but there are very limited Endocrinology options for a very large population.

## 2015-12-16 NOTE — Telephone Encounter (Signed)
Pt called asking if KT new anyone to recommend for testicular hypofunction, pt states that he is not wanting to go to [facility] do to low ratings, so I pulled referral out of their work queue and faxed it to [facility] (at pt request), pt was called with 1st avail appt being in march. Pt does not want to wait that long. Please advise.

## 2015-12-16 NOTE — Telephone Encounter (Signed)
At pt request, sending referral to [facility]. Waiting for appt.

## 2016-01-14 NOTE — Telephone Encounter (Signed)
error 

## 2016-02-17 ENCOUNTER — Telehealth: Payer: Self-pay | Admitting: Family Medicine

## 2016-02-17 DIAGNOSIS — N400 Enlarged prostate without lower urinary tract symptoms: Secondary | ICD-10-CM

## 2016-02-17 NOTE — Telephone Encounter (Signed)
Pt asking for a referral to [facility] at [facility] to see Dr. Edrick Oh

## 2016-02-17 NOTE — Telephone Encounter (Signed)
[place] for referral? Dx BPH

## 2016-02-17 NOTE — Telephone Encounter (Signed)
Ok to refer to Urology.  

## 2016-02-17 NOTE — Telephone Encounter (Signed)
Referral placed.

## 2016-02-25 ENCOUNTER — Other Ambulatory Visit: Payer: Self-pay | Admitting: Family Medicine

## 2016-02-25 MED ORDER — ALPRAZOLAM 1 MG PO TABS: 0.5000 mg | ORAL_TABLET | Freq: Every day | ORAL | 0 refills | Status: DC | PRN

## 2016-02-25 NOTE — Telephone Encounter (Signed)
Alprazolam last filled 08/11/15 #30 with 3, ok to send in?

## 2016-02-25 NOTE — Telephone Encounter (Signed)
Pt states that he is visiting his mother and did not pack and xanax (due to not needing for a while) pt states that he is having anxiety/panic attack and is asking if KT would call in Rx to [facility] at [address], [place], [place] [address]. Phone # [phone]

## 2016-02-25 NOTE — Telephone Encounter (Signed)
Medication filled to pharmacy as requested.  Pt informed.   

## 2016-02-26 ENCOUNTER — Ambulatory Visit (INDEPENDENT_AMBULATORY_CARE_PROVIDER_SITE_OTHER): Payer: BLUE CROSS/BLUE SHIELD | Admitting: Family Medicine

## 2016-02-26 ENCOUNTER — Encounter: Payer: Self-pay | Admitting: Family Medicine

## 2016-02-26 VITALS — BP 130/82 | HR 78 | Temp 98.2°F | Resp 16 | Ht 67.0 in | Wt 174.2 lb

## 2016-02-26 DIAGNOSIS — F419 Anxiety disorder, unspecified: Secondary | ICD-10-CM

## 2016-02-26 NOTE — Patient Instructions (Signed)
Follow up as needed Call and schedule an appt w/ [name] at the [facility] office [phone] Please call Dr Tamala Julian and tell him that this new dose is not working and see if adjustments can be made [name]!!!  This is not your heart, not your lungs, or anything concerning Use the Alprazolam as needed for those panicked moments We may need to consider restarting the Lexapro to help control the anxiety (you can also discuss this with your counselor) Call with any questions or concerns Hang in there!  You've got this!!!

## 2016-02-26 NOTE — Progress Notes (Signed)
   Subjective:    Patient ID: Jon Hipp., male    DOB: 09-Jan-1961, 55 y.o.   MRN: [id number]  HPI Anxiety- pt has not had an issue 'in months' but had an issue yesterday.  Felt weird after waking up, having coffee and this caused him anxiety while driving to PT.  When he arrived at PT, BP was '170 over something'.  This worsened his anxiety.  Took 1/2 tab Xanax and sxs improved.  Able to take a nap.  Felt better.  Went to gym today and again became anxious when HR started to elevate.  Went for a 1.5 mile walk w/o any difficulty.  Pt is not clear on his trigger but he is very fearful of something being wrong with his health.  Pt's testosterone was recently adjusted and this bothers him.  Not currently taking Lexapro.   Review of Systems For ROS see HPI     Objective:   Physical Exam  Constitutional: He is oriented to person, place, and time. He appears well-developed and well-nourished. No distress.  HENT:  Head: Normocephalic and atraumatic.  Eyes: Conjunctivae and EOM are normal. Pupils are equal, round, and reactive to light.  Neck: Normal range of motion. Neck supple. No thyromegaly present.  Cardiovascular: Normal rate, regular rhythm, normal heart sounds and intact distal pulses.   No murmur heard. Pulmonary/Chest: Effort normal and breath sounds normal. No respiratory distress.  Abdominal: Soft. Bowel sounds are normal. He exhibits no distension.  Musculoskeletal: He exhibits no edema.  Lymphadenopathy:    He has no cervical adenopathy.  Neurological: He is alert and oriented to person, place, and time. No cranial nerve deficit.  Skin: Skin is warm and dry.  Psychiatric: He has a normal mood and affect. His behavior is normal.  Vitals reviewed.         Assessment & Plan:

## 2016-02-26 NOTE — Progress Notes (Signed)
Pre visit review using our clinic review tool, if applicable. No additional management support is needed unless otherwise documented below in the visit note. 

## 2016-02-29 NOTE — Assessment & Plan Note (Addendum)
Deteriorated.  Pt has severe anxiety about his health and whenever something doesn't feel the way it should, he panics.  All of his sxs resolved w/ xanax, a nap, and exercise.  I reassured him that going for a 1.5 mile walk while having cardiac chest pain or PE or other cardiopulmonary issue would worsen his sxs.  Pt is convinced that his testosterone changes are worsening his anxiety and his physical sxs- decreased energy, increased irritability.  Encouraged him to discuss this w/ Endo.  Pt has appt upcoming w/ urology.  Reviewed supportive care and red flags that should prompt return.  Total time spent w/ pt, 30 minutes, >50% spent counseling.

## 2016-06-20 ENCOUNTER — Encounter (HOSPITAL_BASED_OUTPATIENT_CLINIC_OR_DEPARTMENT_OTHER): Payer: Self-pay | Admitting: *Deleted

## 2016-06-20 ENCOUNTER — Emergency Department (HOSPITAL_BASED_OUTPATIENT_CLINIC_OR_DEPARTMENT_OTHER): Payer: BLUE CROSS/BLUE SHIELD

## 2016-06-20 ENCOUNTER — Emergency Department (HOSPITAL_BASED_OUTPATIENT_CLINIC_OR_DEPARTMENT_OTHER)
Admission: EM | Admit: 2016-06-20 | Discharge: 2016-06-20 | Disposition: A | Discharge status: Home / Self Care | Payer: BLUE CROSS/BLUE SHIELD | Attending: Emergency Medicine | Admitting: Emergency Medicine

## 2016-06-20 ENCOUNTER — Other Ambulatory Visit: Payer: Self-pay

## 2016-06-20 DIAGNOSIS — R0789 Other chest pain: Secondary | ICD-10-CM | POA: Diagnosis not present

## 2016-06-20 DIAGNOSIS — Z7982 Long term (current) use of aspirin: Secondary | ICD-10-CM | POA: Insufficient documentation

## 2016-06-20 DIAGNOSIS — R079 Chest pain, unspecified: Secondary | ICD-10-CM

## 2016-06-20 LAB — CBC
HCT: 46.6 % (ref 39.0–52.0)
Hemoglobin: 16.3 g/dL (ref 13.0–17.0)
MCH: 31.6 pg (ref 26.0–34.0)
MCHC: 35 g/dL (ref 30.0–36.0)
MCV: 90.3 fL (ref 78.0–100.0)
Platelets: 317 K/uL (ref 150–400)
RBC: 5.16 MIL/uL (ref 4.22–5.81)
RDW: 12.8 % (ref 11.5–15.5)
WBC: 8.6 K/uL (ref 4.0–10.5)

## 2016-06-20 LAB — COMPREHENSIVE METABOLIC PANEL WITH GFR
ALT: 18 U/L (ref 17–63)
AST: 20 U/L (ref 15–41)
Albumin: 4.2 g/dL (ref 3.5–5.0)
Alkaline Phosphatase: 41 U/L (ref 38–126)
Anion gap: 7 (ref 5–15)
BUN: 15 mg/dL (ref 6–20)
CO2: 30 mmol/L (ref 22–32)
Calcium: 9.3 mg/dL (ref 8.9–10.3)
Chloride: 102 mmol/L (ref 101–111)
Creatinine, Ser: 1.48 mg/dL — ABNORMAL HIGH (ref 0.61–1.24)
GFR calc Af Amer: 60 mL/min — ABNORMAL LOW
GFR calc non Af Amer: 52 mL/min — ABNORMAL LOW
Glucose, Bld: 109 mg/dL — ABNORMAL HIGH (ref 65–99)
Potassium: 3.8 mmol/L (ref 3.5–5.1)
Sodium: 139 mmol/L (ref 135–145)
Total Bilirubin: 0.9 mg/dL (ref 0.3–1.2)
Total Protein: 7.6 g/dL (ref 6.5–8.1)

## 2016-06-20 LAB — TROPONIN I
Troponin I: <0.03 ng/mL
Troponin I: <0.03 ng/mL

## 2016-06-20 MED ORDER — ASPIRIN 81 MG PO CHEW
324.0000 mg | CHEWABLE_TABLET | Freq: Once | ORAL | Status: AC
  Administered 2016-06-20: 324 mg via ORAL
  Filled 2016-06-20: qty 4

## 2016-06-20 MED ORDER — RANITIDINE HCL 150 MG/10ML PO SYRP
150.0000 mg | ORAL_SOLUTION | Freq: Once | ORAL | Status: AC
  Administered 2016-06-20: 150 mg via ORAL
  Filled 2016-06-20: qty 10

## 2016-06-20 MED ORDER — PANTOPRAZOLE SODIUM 20 MG PO TBEC: 40.0000 mg | DELAYED_RELEASE_TABLET | Freq: Every day | ORAL | 0 refills | Status: DC

## 2016-06-20 MED ORDER — GI COCKTAIL ~~LOC~~
30.0000 mL | Freq: Once | ORAL | Status: AC
  Administered 2016-06-20: 30 mL via ORAL
  Filled 2016-06-20: qty 30

## 2016-06-20 MED ORDER — RANITIDINE HCL 150 MG PO TABS: 150.0000 mg | ORAL_TABLET | Freq: Two times a day (BID) | ORAL | 0 refills | Status: DC

## 2016-06-20 NOTE — ED Notes (Signed)
ED Provider at bedside. 

## 2016-06-20 NOTE — ED Provider Notes (Signed)
[facility] [facility] Provider Note   CSN: [id number] Arrival date & time: 06/20/16  1447  By signing my name below, I, Dora Sims, attest that this documentation has been prepared under the direction and in the presence of physician practitioner, Merrily Pew, MD. Electronically Signed: Dora Sims, Scribe. 06/20/2016. 3:24 PM.  History   Chief Complaint Chief Complaint  Patient presents with  . Chest Pain    The history is provided by the patient. No language interpreter was used.     HPI Comments: Jon Gotshall Smarr Brooke Bonito. is a 56 y.o. male with PMHx including anxiety, GERD, HTN, and HLD who presents to the Emergency Department complaining of persistent moderate chest pain beginning a couple of days ago. He states "it feels like I have something in my chest that won't go down." Patient reports he has been hiccupping and burping frequently while eating over the last three days which is unusual for him. He has tried Zantac and Protonix with minimal improvement of his symptoms. Pt reports h/o anxiety and acid reflux and notes he has experienced chest discomfort secondary to these conditions in the past, but states his current symptoms are different and more prolonged. He reports he has had increased anxiety recently because his 87 y/o mother had a major heart attack a few weeks ago. He has xanax at home and uses it as prescribed. He states he is anticoagulated with a daily low dose aspirin. Pt does not take regular medication for HTN as he states it has been well-controlled lately. No recent surgeries or immobilizations. No alcohol or illicit drug use. Pt denies abdominal pain, trouble swallowing, nausea, vomiting, SOB, diaphoresis, lightheadedness, syncope, dysuria, urinary frequency, or any other associated symptoms.  Past Medical History:  Diagnosis Date  . Anxiety   . BCC (basal cell carcinoma of skin) 03-2013   scalp  . BPH (benign prostatic hyperplasia)   . Cervical pain  (neck)   . Diverticulosis   . Fatty liver   . GERD   . Headache   . HERPES SIMPLEX, UNCOMPLICATED   . Irritable bowel syndrome (IBS)    WITH DIARRHEA  . LUMBAR RADICULOPATHY, LEFT   . Melanoma in situ ([place]) 12/2014   On lateral right thigh, Dr. Delice Lesch  . Other and unspecified hyperlipidemia   . Other chronic sinusitis   . Other testicular hypofunction   . PANIC ATTACK, ACUTE     Patient Active Problem List   Diagnosis Date Noted  . Chest pain 06/08/2015  . Hyperlipidemia 06/08/2015  . Migraine 05/20/2015  . Generalized anxiety disorder 05/20/2015  . Transient vision disturbance of right eye 04/23/2015  . Prediabetes 11/04/2014  . Annual physical exam 09/04/2014  . Cervical disc disorder with radiculopathy of cervical region 08/29/2012  . 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 BPH (benign prostatic hyperplasia) 07/22/2008  . Back pain-chronic-status post surgery 07/22/2008  . GERD 11/12/2007  . Anxiety-   07/11/2007  . PERSISTENT DISORDER INITIATING/MAINTAINING SLEEP 05/30/2007  . OTHER CHRONIC SINUSITIS 05/30/2007  . 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 Other and unspecified hyperlipidemia 11/22/2006  . HERPES SIMPLEX, UNCOMPLICATED 0000000  . IRRITABLE BOWEL SYNDROME 11/10/2006    Past Surgical History:  Procedure Laterality Date  . BACK SURGERY  2008  . birth mark removal     as baby  . COLONOSCOPY    . GANGLION CYST EXCISION     finger  . HERNIA REPAIR     Inguinal  . ORCHIECTOMY Right ~1995   benign  .  SPINE SURGERY    . TONSILLECTOMY         Home Medications    Prior to Admission medications   Medication Sig Start Date End Date Taking? Authorizing Provider  ALPRAZolam Duanne Moron) 1 MG tablet Take 0.5-1 tablets (0.5-1 mg total) by mouth daily as needed. 02/25/16   Midge Minium, MD  Ascorbic Acid (VITAMIN C) 1000 MG tablet Take 1,000 mg by mouth daily.    Historical Provider, MD  aspirin 81 MG tablet Take 81 mg by mouth daily.    Historical  Provider, MD  B Complex-C (SUPER B COMPLEX PO) Take 2 tablets by mouth daily.    Historical Provider, MD  cetirizine (ZYRTEC) 10 MG tablet Take 1 tablet (10 mg total) by mouth daily. 12/01/15   Midge Minium, MD  Cholecalciferol (VITAMIN D3) 5000 UNITS CAPS Take 1 capsule by mouth daily.    Historical Provider, MD  cimetidine (TAGAMET) 200 MG tablet Take 200 mg by mouth 2 (two) times daily.    Historical Provider, MD  escitalopram (LEXAPRO) 10 MG tablet Take 1 tablet (10 mg total) by mouth daily. 08/11/15   Midge Minium, MD  fluticasone (FLONASE) 50 MCG/ACT nasal spray Place 2 sprays into both nostrils daily. 12/01/15   Midge Minium, MD  HYDROcodone-acetaminophen (NORCO) 10-325 MG tablet Take 1 tablet by mouth every 6 (six) hours as needed. Reported on 08/11/2015    Historical Provider, MD  Magnesium 500 MG CAPS Take 1,000 mg by mouth daily.    Historical Provider, MD  Needle, Disp, (HYPODERMIC NEEDLE 23GX1") 23G X 1" MISC Please use one needle each time testosterone is given. 09/10/15   Midge Minium, MD  NEEDLE, DISP, 18 G 18G X 1" MISC Use one needle each time testosterone is drawn for injection. 09/15/15   Midge Minium, MD  Omega 3 1000 MG CAPS Take by mouth.    Historical Provider, MD  OVER THE COUNTER MEDICATION [employer] triple action joint    Historical Provider, MD  pantoprazole (PROTONIX) 20 MG tablet Take 2 tablets (40 mg total) by mouth daily. 06/20/16   Merrily Pew, MD  ranitidine (ZANTAC) 150 MG tablet Take 1 tablet (150 mg total) by mouth 2 (two) times daily. 06/20/16   Merrily Pew, MD  Red Yeast Rice Extract (RED YEAST RICE PO) Take 2,400 mg by mouth daily.    Historical Provider, MD  Syringe, Disposable, 3 ML MISC Please use one syringe each time testosterone injection is given. 09/10/15   Midge Minium, MD  testosterone cypionate (DEPOTESTOSTERONE CYPIONATE) 200 MG/ML injection Inject 1 mL (200 mg total) into the muscle every 14 (fourteen) days. 11/25/15    Midge Minium, MD  valACYclovir (VALTREX) 500 MG tablet Take 1 tablet (500 mg total) by mouth daily. 11/04/15   Midge Minium, MD  Zinc 50 MG CAPS Take by mouth.    Historical Provider, MD    Family History Family History  Problem Relation Age of Onset  . Heart disease Mother   . Prostate cancer Father   . Lung cancer Father   . Colon cancer Paternal Grandmother   . Heart attack Paternal Grandfather   . Heart disease Maternal Grandmother   . Prostate cancer Paternal Uncle   . Cancer Maternal Uncle     prostate cancer  . Esophageal cancer Neg Hx   . Rectal cancer Neg Hx   . Stomach cancer Neg Hx     Social History Social History  Substance Use Topics  . Smoking status: Never Smoker  . Smokeless tobacco: Never Used  . Alcohol use 0.0 oz/week     Comment: Approximately once a month     Allergies   Erythromycin   Review of Systems Review of Systems  Constitutional: Negative for diaphoresis.  HENT: Negative for trouble swallowing.   Respiratory: Negative for shortness of breath.   Cardiovascular: Positive for chest pain.  Gastrointestinal: Negative for abdominal pain, nausea and vomiting.  Genitourinary: Negative for dysuria and frequency.  Neurological: Negative for syncope and light-headedness.  All other systems reviewed and are negative.    Physical Exam Updated Vital Signs BP 148/85   Pulse 69   Temp 99.1 F (37.3 C) (Oral)   Resp 20   Ht 5\' 7"  (1.702 m)   Wt 170 lb (77.1 kg)   SpO2 94%   BMI 26.63 kg/m   Physical Exam  Constitutional: He is oriented to person, place, and time. He appears well-developed and well-nourished. No distress.  HENT:  Head: Normocephalic and atraumatic.  Eyes: Conjunctivae and EOM are normal.  Neck: Neck supple. No tracheal deviation present.  Cardiovascular: Normal rate, regular rhythm, normal heart sounds and intact distal pulses.  Exam reveals no gallop and no friction rub.   No murmur heard. Pulmonary/Chest:  Effort normal and breath sounds normal. No respiratory distress. He has no wheezes. He has no rales.  Musculoskeletal: Normal range of motion. He exhibits no edema.  No posterior calf tenderness bilaterally. No leg swelling. No pain with dorsiflexion or plantar flexion bilaterally.  Neurological: He is alert and oriented to person, place, and time.  Skin: Skin is warm and dry.  Psychiatric: He has a normal mood and affect. His behavior is normal.  Nursing note and vitals reviewed.    ED Treatments / Results  Labs (all labs ordered are listed, but only abnormal results are displayed) Labs Reviewed  COMPREHENSIVE METABOLIC PANEL - Abnormal; Notable for the following:       Result Value   Glucose, Bld 109 (*)    Creatinine, Ser 1.48 (*)    GFR calc non Af Amer 52 (*)    GFR calc Af Amer 60 (*)    All other components within normal limits  CBC  TROPONIN I  TROPONIN I    EKG  EKG Interpretation  Date/Time:  Monday June 20 2016 20:09:43 EST Ventricular Rate:  68 PR Interval:  116 QRS Duration: 97 QT Interval:  407 QTC Calculation: 433 R Axis:   -66 Text Interpretation:  Sinus rhythm Left anterior fascicular block Abnormal R-wave progression, late transition Minimal ST elevation, lateral leads No significant change since last tracing Confirmed by [facility] MD, Corene Cornea [phone]) on 06/20/2016 8:26:50 PM       Radiology Dg Chest 2 View  Result Date: 06/20/2016 CLINICAL DATA:  Central burning chest pain. EXAM: CHEST  2 VIEW COMPARISON:  06/03/2015 FINDINGS: The cardiomediastinal silhouette is within normal limits. The lungs are clear. No pleural effusion or pneumothorax is identified. There is mild thoracolumbar dextroscoliosis. IMPRESSION: No active cardiopulmonary disease. Electronically Signed   By: Logan Bores M.D.   On: 06/20/2016 16:09    Procedures Procedures (including critical care time)  DIAGNOSTIC STUDIES: Oxygen Saturation is 99% on RA, normal by my interpretation.      COORDINATION OF CARE: 3:35 PM Discussed treatment plan with pt at bedside and pt agreed to plan.  Medications Ordered in ED Medications  aspirin chewable tablet 324 mg (324 mg  Oral Given 06/20/16 1606)  gi cocktail (Maalox,Lidocaine,Donnatal) (30 mLs Oral Given 06/20/16 1606)  ranitidine (ZANTAC) 150 MG/10ML syrup 150 mg (150 mg Oral Given 06/20/16 1606)     Initial Impression / Assessment and Plan / ED Course  I have reviewed the triage vital signs and the nursing notes.  Pertinent labs & imaging results that were available during my care of the patient were reviewed by me and considered in my medical decision making (see chart for details).  5:11 PM: Patient states he feels completely normal. He states the GI cocktail was "amazing." No additional concerns or complaints noted at this time.  Symptoms totally relieved with GI cocktail. Delta troponin and surgery EKG is negative. Second EKG with pattern concern for pericarditis however patient without any symptoms. This is unlikely. It is more likely related to placement. Plan for discharge on esophagitis medications and PCP follow-up.  Final Clinical Impressions(s) / ED Diagnoses   Final diagnoses:  Nonspecific chest pain    New Prescriptions Discharge Medication List as of 06/20/2016  8:03 PM    START taking these medications   Details  ranitidine (ZANTAC) 150 MG tablet Take 1 tablet (150 mg total) by mouth 2 (two) times daily., Starting Mon 06/20/2016, Print       I personally performed the services described in this documentation, which was scribed in my presence. The recorded information has been reviewed and is accurate.    Merrily Pew, MD 06/21/16 [phone]

## 2016-06-20 NOTE — ED Triage Notes (Signed)
Chest pain x 2 days. Under increased stress. Hx of anxiety.

## 2016-06-22 ENCOUNTER — Ambulatory Visit (INDEPENDENT_AMBULATORY_CARE_PROVIDER_SITE_OTHER): Payer: BLUE CROSS/BLUE SHIELD | Admitting: Family Medicine

## 2016-06-22 ENCOUNTER — Encounter: Payer: Self-pay | Admitting: Family Medicine

## 2016-06-22 VITALS — BP 126/86 | HR 79 | Temp 97.9°F | Resp 16 | Ht 67.0 in | Wt 174.4 lb

## 2016-06-22 DIAGNOSIS — K219 Gastro-esophageal reflux disease without esophagitis: Secondary | ICD-10-CM | POA: Diagnosis not present

## 2016-06-22 DIAGNOSIS — R0789 Other chest pain: Secondary | ICD-10-CM | POA: Diagnosis not present

## 2016-06-22 MED ORDER — PANTOPRAZOLE SODIUM 40 MG PO TBEC: 40.0000 mg | DELAYED_RELEASE_TABLET | Freq: Every day | ORAL | 3 refills | Status: DC

## 2016-06-22 NOTE — Assessment & Plan Note (Signed)
New.  Pt's ER workup WNL.  Suspect his chest pain is due to anxiety and GERD as sxs have improved w/ both GI cocktail and Alprazolam.  Since mother just had MI and this is weighing heavily on him, will refer him to cards for complete evaluation and stress testing if they determine this is necessary.  Reviewed supportive care and red flags that should prompt return.  Pt expressed understanding and is in agreement w/ plan.

## 2016-06-22 NOTE — Progress Notes (Signed)
   Subjective:    Patient ID: Jon Hipp., male    DOB: 09/02/1960, 56 y.o.   MRN: [id number]  HPI ER f/u- pt went to ER on 2/26 w/ chest pain.  Mother had heart attack 2 weeks ago and 'now i'm totally stressed out'.  Pt's CP was due to GERD and sxs improved after GI cocktail in ER.  Has had increased hiccups and belching along w/ central chest pain.  BP has been elevated at home.  Was started on Ranitidine and Protonix in ER.  Troponin and EKG in ER WNL.     Review of Systems For ROS see HPI     Objective:   Physical Exam  Constitutional: He is oriented to person, place, and time. He appears well-developed and well-nourished. No distress.  HENT:  Head: Normocephalic and atraumatic.  Eyes: Conjunctivae and EOM are normal. Pupils are equal, round, and reactive to light.  Neck: Normal range of motion. Neck supple. No thyromegaly present.  Cardiovascular: Normal rate, regular rhythm, normal heart sounds and intact distal pulses.   No murmur heard. Pulmonary/Chest: Effort normal and breath sounds normal. No respiratory distress.  Abdominal: Soft. Bowel sounds are normal. He exhibits no distension.  Musculoskeletal: He exhibits no edema.  Lymphadenopathy:    He has no cervical adenopathy.  Neurological: He is alert and oriented to person, place, and time. No cranial nerve deficit.  Skin: Skin is warm and dry.  Psychiatric: He has a normal mood and affect. His behavior is normal.  Vitals reviewed.         Assessment & Plan:

## 2016-06-22 NOTE — Assessment & Plan Note (Signed)
Deteriorated.  Increase Protonix to 40mg  daily.  Use Zantac as needed for breakthrough pain.  Reviewed dietary and lifestyle modifications.  Will follow.

## 2016-06-22 NOTE — Progress Notes (Signed)
Pre visit review using our clinic review tool, if applicable. No additional management support is needed unless otherwise documented below in the visit note. 

## 2016-06-22 NOTE — Patient Instructions (Signed)
Follow up as needed/scheduled We'll call you with your Cardiology appt Continue the Protonix once daily and use the Ranitidine as needed for breakthrough pain Use the Alprazolam as needed for anxiety Call with any questions or concerns Hang in there!!!

## 2016-06-23 ENCOUNTER — Telehealth: Payer: Self-pay | Admitting: *Deleted

## 2016-06-23 NOTE — Telephone Encounter (Signed)
Discussed all the things below with patient, and he is agreeable.    Patient stated that the referral to Cardiology in [facility] is fine, and he will await the call to schedule.     He will let us know if any other symptoms develop that could point towards Sleep Apnea.

## 2016-06-23 NOTE — Telephone Encounter (Signed)
Patient saw his urologist (Dr. Jacqlyn Larsen)  yesterday and he recommended Dr. Rockey Situ in [facility] for Cardiology (stress test) -  Patient questioning if this is who [facility]ie Riddle would recommend as well.   Also - Urologist stated that since he is snoring at night, he mentioned that he might need to have a sleep study - patient is questioning if this is something that [facility]ie Riddle would refer him to.

## 2016-06-23 NOTE — Telephone Encounter (Signed)
Pt specifically asked me for the [facility] location.  Dr Rockey Situ is a good doctor- but so are many others.  There is no reason to drive all the way to [facility] for this evaluation.  Also, not all snoring requires a sleep study.  Snoring is common and not usually pathologic.  Breathing pauses- or apneic events- are what we worry about.  At this time, I would hold off on referral unless sxs change

## 2016-06-29 ENCOUNTER — Ambulatory Visit (INDEPENDENT_AMBULATORY_CARE_PROVIDER_SITE_OTHER): Payer: BLUE CROSS/BLUE SHIELD | Admitting: Physician Assistant

## 2016-06-29 ENCOUNTER — Encounter: Payer: Self-pay | Admitting: Physician Assistant

## 2016-06-29 VITALS — BP 152/80 | HR 98 | Ht 67.0 in | Wt 175.4 lb

## 2016-06-29 DIAGNOSIS — E784 Other hyperlipidemia: Secondary | ICD-10-CM | POA: Diagnosis not present

## 2016-06-29 DIAGNOSIS — R0789 Other chest pain: Secondary | ICD-10-CM

## 2016-06-29 DIAGNOSIS — R030 Elevated blood-pressure reading, without diagnosis of hypertension: Secondary | ICD-10-CM

## 2016-06-29 DIAGNOSIS — E7849 Other hyperlipidemia: Secondary | ICD-10-CM

## 2016-06-29 NOTE — Patient Instructions (Addendum)
Medication Instructions:  Your physician recommends that you continue on your current medications as directed. Please refer to the Current Medication list given to you today.  Labwork: NONE  Testing/Procedures: 1. Your physician has requested that you have an exercise tolerance test. For further information please visit [email or web address]. Please also follow instruction sheet, as given.  Follow-Up: DR. Acie Fredrickson AS NEEDED AT THIS TIME   Any Other Special Instructions Will Be Listed Below (If Applicable).  If you need a refill on your cardiac medications before your next appointment, please call your pharmacy.

## 2016-06-29 NOTE — Progress Notes (Signed)
Cardiology Office Note:    Date:  06/29/2016   ID:  Jon Hipp., DOB 08-22-1960, MRN [id number]  PCP:  Annye Asa, MD  Cardiologist:  Dr. Liam Rogers   Electrophysiologist:  n/a  Referring MD: Midge Minium, MD   Chief Complaint  Patient presents with  . Chest Pain    History of Present Illness:    Jon Cowles Olarte Brooke Bonito. is a 56 y.o. male with a hx of chest pain, HL.  He was evaluated by Dr. Liam Rogers in 2017.  No further testing was obtained.  FU in 3 mos was recommended.    He was seen in the ED on 06/20/16 with chest pain that was relieved by GI cocktail.  Troponin levels were neg.  ECG was unremarkable.  CXR was unremarkable.  His mother recently had a myocardial infarction.  His PCP referred him back to Cardiology for evaluation.    His mother recently had a myocardial infarction. She had several stents placed in [place] in [place]. The patient has been quite anxious about his own health since this time. He has a history of anxiety. He has not been sleeping well. He ate late one night and then woke up with chest discomfort. His symptoms did abate with GI cocktail. He was previously fairly active without exertional chest pain or shortness of breath. He has not been able to exercise since his mother became ill. He denies syncope, orthopnea, PND or edema.  Prior CV studies:   The following studies were reviewed today:  Carotid US 12/16 IMPRESSION: Color duplex indicates minimal heterogeneous plaque, with no hemodynamically significant stenosis by duplex criteria in the extracranial cerebrovascular circulation.  Past Medical History:  Diagnosis Date  . Anxiety   . BCC (basal cell carcinoma of skin) 03-2013   scalp  . BPH (benign prostatic hyperplasia)   . Cervical pain (neck)   . Diverticulosis   . Fatty liver   . GERD   . Headache   . HERPES SIMPLEX, UNCOMPLICATED   . Irritable bowel syndrome (IBS)    WITH DIARRHEA  . LUMBAR  RADICULOPATHY, LEFT   . Melanoma in situ ([place]) 12/2014   On lateral right thigh, Dr. Delice Lesch  . Other and unspecified hyperlipidemia   . Other chronic sinusitis   . Other testicular hypofunction   . PANIC ATTACK, ACUTE     Past Surgical History:  Procedure Laterality Date  . BACK SURGERY  2008  . birth mark removal     as baby  . COLONOSCOPY    . GANGLION CYST EXCISION     finger  . HERNIA REPAIR     Inguinal  . ORCHIECTOMY Right ~1995   benign  . SPINE SURGERY    . TONSILLECTOMY      Current Medications: Current Meds  Medication Sig  . ALPRAZolam (XANAX) 1 MG tablet Take 0.5-1 tablets (0.5-1 mg total) by mouth daily as needed.  . Ascorbic Acid (VITAMIN C) 1000 MG tablet Take 1,000 mg by mouth daily.  Marland Kitchen aspirin 81 MG tablet Take 81 mg by mouth daily.  . B Complex-C (SUPER B COMPLEX PO) Take 2 tablets by mouth daily.  . cetirizine (ZYRTEC) 10 MG tablet Take 1 tablet (10 mg total) by mouth daily.  . Cholecalciferol (VITAMIN D3) 5000 UNITS CAPS Take 1 capsule by mouth daily.  Marland Kitchen escitalopram (LEXAPRO) 10 MG tablet Take 1 tablet (10 mg total) by mouth daily.  . fluticasone (FLONASE) 50 MCG/ACT nasal spray Place  2 sprays into both nostrils daily.  Marland Kitchen HYDROcodone-acetaminophen (NORCO) 10-325 MG tablet Take 1 tablet by mouth every 6 (six) hours as needed. Reported on 08/11/2015  . Magnesium 500 MG CAPS Take 1,000 mg by mouth daily.  . Needle, Disp, (HYPODERMIC NEEDLE 23GX1") 23G X 1" MISC Please use one needle each time testosterone is given.  Marland Kitchen NEEDLE, DISP, 18 G 18G X 1" MISC Use one needle each time testosterone is drawn for injection.  . Omega 3 1000 MG CAPS Take by mouth.  Marland Kitchen OVER THE COUNTER MEDICATION [employer] triple action joint  . pantoprazole (PROTONIX) 40 MG tablet Take 1 tablet (40 mg total) by mouth daily.  . ranitidine (ZANTAC) 150 MG tablet Take 1 tablet (150 mg total) by mouth 2 (two) times daily.  . Red Yeast Rice Extract (RED YEAST RICE PO) Take 2,400 mg by mouth  daily.  . Syringe, Disposable, 3 ML MISC Please use one syringe each time testosterone injection is given.  . testosterone cypionate (DEPOTESTOSTERONE CYPIONATE) 200 MG/ML injection Inject 1 mL (200 mg total) into the muscle every 14 (fourteen) days.  . valACYclovir (VALTREX) 500 MG tablet Take 1 tablet (500 mg total) by mouth daily.  . Zinc 50 MG CAPS Take by mouth.     Allergies:   Erythromycin   Social History   Social History  . Marital status: Single    Spouse name: N/A  . Number of children: 0  . Years of education: HS   Occupational History  . furniture sales     Social History Main Topics  . Smoking status: Never Smoker  . Smokeless tobacco: Never Used  . Alcohol use 0.0 oz/week     Comment: Approximately once a month  . Drug use: No  . Sexual activity: Yes   Other Topics Concern  . None   Social History Narrative   Lives w/ partner Jon Richardson.   Right-handed.   2 cups coffee daily.     Family History  Problem Relation Age of Onset  . Heart disease Mother   . Heart attack Mother   . Prostate cancer Father   . Lung cancer Father   . Colon cancer Paternal Grandmother   . Heart attack Paternal Grandfather   . Heart disease Maternal Grandmother   . Prostate cancer Paternal Uncle   . Cancer Maternal Uncle     prostate cancer  . Esophageal cancer Neg Hx   . Rectal cancer Neg Hx   . Stomach cancer Neg Hx      ROS:   Please see the history of present illness.    ROS All other systems reviewed and are negative.   EKGs/Labs/Other Test Reviewed:    EKG:  EKG is  ordered today.  The ekg ordered today demonstrates NSR, HR 98, LAD, QTc 431, no change since prior tracing  Recent Labs: 10/12/2015: TSH 3.57 06/20/2016: ALT 18; BUN 15; Creatinine, Ser 1.48; Hemoglobin 16.3; Platelets 317; Potassium 3.8; Sodium 139   Recent Lipid Panel    Component Value Date/Time   CHOL 194 08/11/2015 0858   TRIG 160.0 (H) 08/11/2015 0858   HDL 44.70 08/11/2015 0858    CHOLHDL 4 08/11/2015 0858   VLDL 32.0 08/11/2015 0858   LDLCALC 118 (H) 08/11/2015 0858   LDLDIRECT 121.0 05/11/2015 1113     Physical Exam:    VS:  BP (!) 152/80 (BP Location: Left Arm, Patient Position: Sitting, Cuff Size: Normal)   Pulse 98   Ht 5'  7" (1.702 m)   Wt 175 lb 6.4 oz (79.6 kg)   BMI 27.47 kg/m     Wt Readings from Last 3 Encounters:  06/29/16 175 lb 6.4 oz (79.6 kg)  06/22/16 174 lb 6 oz (79.1 kg)  06/20/16 170 lb (77.1 kg)     Physical Exam  Constitutional: He is oriented to person, place, and time. He appears well-developed and well-nourished. No distress.  HENT:  Head: Normocephalic and atraumatic.  Eyes: No scleral icterus.  Neck: Normal range of motion. No JVD present.  Cardiovascular: Normal rate, regular rhythm, S1 normal and S2 normal.   No murmur heard. Pulmonary/Chest: Effort normal and breath sounds normal. He has no wheezes. He has no rhonchi. He has no rales.  Abdominal: Soft. There is no tenderness.  Musculoskeletal: He exhibits no edema.  Neurological: He is alert and oriented to person, place, and time.  Skin: Skin is warm and dry.  Psychiatric: He has a normal mood and affect.    ASSESSMENT:    1. Other chest pain   2. Other hyperlipidemia   3. Elevated blood pressure reading    PLAN:    In order of problems listed above:  1. Other chest pain - He has had atypical chest pain. He does have a family history as well as a history of hyperlipidemia. He would like to screen for coronary disease. He had a bad experience with an exercise treadmill test years ago. He is willing to proceed with exercise testing again. Arrange plain exercise treadmill test.  2. Other hyperlipidemia - Managed by primary care.  3. Elevated Blood Pressure - Blood pressure elevated today. He notes his blood pressure is optimal at home. Continue to monitor.   Dispo:  Return if symptoms worsen or fail to improve, for w/ Dr. Acie Fredrickson.   Medication Adjustments/Labs  and Tests Ordered: Current medicines are reviewed at length with the patient today.  Concerns regarding medicines are outlined above.  Medication changes, Labs and Tests ordered today are outlined in the Patient Instructions noted below. Patient Instructions  Medication Instructions:  Your physician recommends that you continue on your current medications as directed. Please refer to the Current Medication list given to you today.  Labwork: NONE  Testing/Procedures: 1. Your physician has requested that you have an exercise tolerance test. For further information please visit [email or web address]. Please also follow instruction sheet, as given.  Follow-Up: DR. Acie Fredrickson AS NEEDED AT THIS TIME   Any Other Special Instructions Will Be Listed Below (If Applicable).  If you need a refill on your cardiac medications before your next appointment, please call your pharmacy.  Signed, Richardson Dopp, PA-C  06/29/2016 5:16 PM    [facility] [address], [place], [place]  [address] Phone: [phone]; Fax: [phone]

## 2016-06-30 ENCOUNTER — Ambulatory Visit (INDEPENDENT_AMBULATORY_CARE_PROVIDER_SITE_OTHER): Payer: BLUE CROSS/BLUE SHIELD

## 2016-06-30 ENCOUNTER — Encounter: Payer: Self-pay | Admitting: Physician Assistant

## 2016-06-30 DIAGNOSIS — R0789 Other chest pain: Secondary | ICD-10-CM

## 2016-06-30 LAB — EXERCISE TOLERANCE TEST
CHL CUP STRESS STAGE 1 GRADE: 0 %
CHL CUP STRESS STAGE 1 SPEED: 0 mph
CHL CUP STRESS STAGE 2 GRADE: 0 %
CHL CUP STRESS STAGE 2 HR: 101 {beats}/min
CHL CUP STRESS STAGE 2 SPEED: 1 mph
CHL CUP STRESS STAGE 3 HR: 103 {beats}/min
CHL CUP STRESS STAGE 5 GRADE: 12 %
CHL CUP STRESS STAGE 5 SBP: 172 mmHg
CHL CUP STRESS STAGE 6 GRADE: 14 %
CHL CUP STRESS STAGE 6 HR: 155 {beats}/min
CHL CUP STRESS STAGE 6 SBP: 178 mmHg
CHL CUP STRESS STAGE 7 GRADE: 16 %
CHL CUP STRESS STAGE 7 HR: 169 {beats}/min
CHL CUP STRESS STAGE 8 GRADE: 0 %
CHL CUP STRESS STAGE 9 GRADE: 0 %
CHL CUP STRESS STAGE 9 HR: 112 {beats}/min
CHL CUP STRESS STAGE 9 SBP: 135 mmHg
CHL CUP STRESS STAGE 9 SPEED: 0 mph
CSEPPHR: 169 {beats}/min
Estimated workload: 11.7 METS
Exercise duration (min): 10 min
Exercise duration (sec): 2 s
MPHR: 165 {beats}/min
Percent HR: 102 %
Percent of predicted max HR: 102 %
RPE: 15
Rest HR: 89 {beats}/min
Stage 1 DBP: 92 mmHg
Stage 1 HR: 94 {beats}/min
Stage 1 SBP: 151 mmHg
Stage 3 Grade: 0.1 %
Stage 3 Speed: 1 mph
Stage 4 Grade: 10 %
Stage 4 HR: 111 {beats}/min
Stage 4 Speed: 1.7 mph
Stage 5 DBP: 69 mmHg
Stage 5 HR: 134 {beats}/min
Stage 5 Speed: 2.5 mph
Stage 6 DBP: 65 mmHg
Stage 6 Speed: 3.4 mph
Stage 7 Speed: 4.2 mph
Stage 8 HR: 153 {beats}/min
Stage 8 Speed: 1.5 mph
Stage 9 DBP: 73 mmHg

## 2016-07-01 ENCOUNTER — Encounter: Payer: Self-pay | Admitting: Physician Assistant

## 2016-07-01 ENCOUNTER — Telehealth: Payer: Self-pay | Admitting: *Deleted

## 2016-07-01 NOTE — Telephone Encounter (Signed)
Pt notified of normal GXT results by phone with verbal understanding. I will fax a copy of GXT results over to PCP as well.

## 2016-07-04 ENCOUNTER — Telehealth: Payer: Self-pay | Admitting: Family Medicine

## 2016-07-04 ENCOUNTER — Other Ambulatory Visit: Payer: Self-pay | Admitting: General Practice

## 2016-07-04 MED ORDER — RABEPRAZOLE SODIUM 20 MG PO TBEC: 20.0000 mg | DELAYED_RELEASE_TABLET | Freq: Two times a day (BID) | ORAL | 3 refills | Status: DC

## 2016-07-04 NOTE — Telephone Encounter (Signed)
Ok to switch back to Aciphex, #30, 3 refills

## 2016-07-04 NOTE — Telephone Encounter (Signed)
Pantoprazole (Protonix) was sent w/ refills on 2/28.

## 2016-07-04 NOTE — Telephone Encounter (Signed)
Spoke with pt, he advised that the protonix is not working... He would like to know if he could be switched back to aciphex. He says that he is hurting and is not able to sleep. Please advise?

## 2016-07-04 NOTE — Telephone Encounter (Signed)
Medication filled and pt made aware. Also advised that if the pain does not resolve with medication we will get him in with GI.

## 2016-07-04 NOTE — Telephone Encounter (Signed)
Pt asking if he could be switch back to pantoprazole, Pt states that he is not getting relief with the other meds, [name] at [facility]

## 2016-07-05 ENCOUNTER — Telehealth: Payer: Self-pay | Admitting: Family Medicine

## 2016-07-05 NOTE — Telephone Encounter (Signed)
Pt asking for a referral to GI, Pt states that [facility] can not see him until Mon 3/19 and he would like something sooner.

## 2016-07-05 NOTE — Telephone Encounter (Signed)
Unfortunately, that is an incredible turn around time for an appointment. We can place a referral but he will not be seen before that.

## 2016-07-05 NOTE — Telephone Encounter (Signed)
LM for call back

## 2016-07-11 ENCOUNTER — Ambulatory Visit (INDEPENDENT_AMBULATORY_CARE_PROVIDER_SITE_OTHER): Payer: BLUE CROSS/BLUE SHIELD | Admitting: Physician Assistant

## 2016-07-11 ENCOUNTER — Encounter: Payer: Self-pay | Admitting: Physician Assistant

## 2016-07-11 VITALS — BP 124/80 | HR 79 | Ht 67.0 in | Wt 176.1 lb

## 2016-07-11 DIAGNOSIS — R1030 Lower abdominal pain, unspecified: Secondary | ICD-10-CM

## 2016-07-11 DIAGNOSIS — K219 Gastro-esophageal reflux disease without esophagitis: Secondary | ICD-10-CM

## 2016-07-11 DIAGNOSIS — R0789 Other chest pain: Secondary | ICD-10-CM | POA: Diagnosis not present

## 2016-07-11 NOTE — Patient Instructions (Signed)

## 2016-07-11 NOTE — Progress Notes (Signed)
Reviewed and agree with initial management plan.  Malcolm T. Stark, MD FACG 

## 2016-07-11 NOTE — Progress Notes (Signed)
Chief Complaint: GERD, chest pain  HPI:  Jon Richardson is a 56 year old Caucasian male with a past medical history of anxiety, diverticulosis, fatty liver, GERD and others listed below, who was referred to me by Midge Minium, MD for a complaint of GERD and atypical chest pain .     According to chart review patient has recently been evaluated by cardiology including a stress test which was normal and cardiac origin for his chest pain has been ruled out.   Today, the patient tells me that he has had reflux for "years", he was originally on Nexium for this which was changed to AcipHex when it stopped working as well and then recently switched to Protonix a while ago. Patient tells me that he then lost around 20 pounds due to diet and exercise and was able to only use Zantac when necessary for reflux symptoms over the past 6 months or so. Patient has also been using apple cider vinegar on a daily basis. He then tells me that his mother had a heart attack early this year and this caused him a lot of stress and anxiety and a couple days after this occurred the patient ate a stromboli late at night and lay down to sleep and woke up with a large amount of epigastric pain which radiated into his chest. He proceeded to the ED and had a workup which was completely fine. His symptoms were somewhat resolved with a GI cocktail at that time. Patient tells me that after this he stopped all of his supplements and started just taking AcipHex once daily. He tells me he has a decreased amount of chest pain and reflux at the moment. He does recall an episode a couple of days ago when raking the lawn, but tells me that when this occurred he became very anxious and later took a Xanax which relieved his chest pain and symptoms.   Patient also describes he has always had a lot of gas and has a diagnosis of IBS in his past. Currently he is not having diarrhea like he was back then and has daily regular bowel movements  with his change in diet and decrease in sugar, but he does tell me that when he wakes up in the morning he has lower abdominal pain which is relieved after his morning bowel movement.   Patient denies fever, chills, blood in the stool, melena, fatigue, anorexia, nausea, vomiting, dysphagia or symptoms that awaken him at night.  Past Medical History:  Diagnosis Date  . Anxiety   . BCC (basal cell carcinoma of skin) 03-2013   scalp  . BPH (benign prostatic hyperplasia)   . Cervical pain (neck)   . Diverticulosis   . Fatty liver   . GERD   . Headache   . HERPES SIMPLEX, UNCOMPLICATED   . History of exercise stress test    ETT 3/18: Ex 10'02", normal BP response, no ST changes; neg adequate ETT  . Irritable bowel syndrome (IBS)    WITH DIARRHEA  . LUMBAR RADICULOPATHY, LEFT   . Melanoma in situ ([place]) 12/2014   On lateral right thigh, Dr. Delice Lesch  . Other and unspecified hyperlipidemia   . Other chronic sinusitis   . Other testicular hypofunction   . PANIC ATTACK, ACUTE     Past Surgical History:  Procedure Laterality Date  . BACK SURGERY  2008  . birth mark removal     as baby  . COLONOSCOPY    . GANGLION CYST  EXCISION     finger  . HERNIA REPAIR     Inguinal  . ORCHIECTOMY Right ~1995   benign  . SPINE SURGERY    . TONSILLECTOMY      Current Outpatient Prescriptions  Medication Sig Dispense Refill  . ALPRAZolam (XANAX) 1 MG tablet Take 0.5-1 tablets (0.5-1 mg total) by mouth daily as needed. 30 tablet 0  . Ascorbic Acid (VITAMIN C) 1000 MG tablet Take 1,000 mg by mouth daily.    Marland Kitchen aspirin 81 MG tablet Take 81 mg by mouth daily.    . B Complex-C (SUPER B COMPLEX PO) Take 2 tablets by mouth daily.    . cetirizine (ZYRTEC) 10 MG tablet Take 1 tablet (10 mg total) by mouth daily. 30 tablet 11  . Cholecalciferol (VITAMIN D3) 5000 UNITS CAPS Take 1 capsule by mouth daily.    . fluticasone (FLONASE) 50 MCG/ACT nasal spray Place 2 sprays into both nostrils daily. 16 g 6  .  HYDROcodone-acetaminophen (NORCO) 10-325 MG tablet Take 1 tablet by mouth every 6 (six) hours as needed. Reported on 08/11/2015    . Magnesium 500 MG CAPS Take 1,000 mg by mouth daily.    . Needle, Disp, (HYPODERMIC NEEDLE 23GX1") 23G X 1" MISC Please use one needle each time testosterone is given. 25 each 1  . NEEDLE, DISP, 18 G 18G X 1" MISC Use one needle each time testosterone is drawn for injection. 25 each 12  . Omega 3 1000 MG CAPS Take by mouth.    Marland Kitchen OVER THE COUNTER MEDICATION [employer] triple action joint    . RABEprazole (ACIPHEX) 20 MG tablet Take 20 mg by mouth daily.    . ranitidine (ZANTAC) 150 MG tablet Take 1 tablet (150 mg total) by mouth 2 (two) times daily. 60 tablet 0  . Red Yeast Rice Extract (RED YEAST RICE PO) Take 2,400 mg by mouth daily.    . Syringe, Disposable, 3 ML MISC Please use one syringe each time testosterone injection is given. 25 each 1  . testosterone cypionate (DEPOTESTOSTERONE CYPIONATE) 200 MG/ML injection Inject 1 mL (200 mg total) into the muscle every 14 (fourteen) days. 10 mL 3  . valACYclovir (VALTREX) 500 MG tablet Take 1 tablet (500 mg total) by mouth daily. 30 tablet 5  . Zinc 50 MG CAPS Take by mouth.     No current facility-administered medications for this visit.     Allergies as of 07/11/2016 - Review Complete 07/11/2016  Allergen Reaction Noted  . Erythromycin Diarrhea     Family History  Problem Relation Age of Onset  . Heart disease Mother   . Heart attack Mother   . Prostate cancer Father   . Lung cancer Father   . Colon cancer Paternal Grandmother   . Heart attack Paternal Grandfather   . Heart disease Maternal Grandmother   . Prostate cancer Paternal Uncle   . Cancer Maternal Uncle     prostate cancer  . Esophageal cancer Neg Hx   . Rectal cancer Neg Hx   . Stomach cancer Neg Hx     Social History   Social History  . Marital status: Single    Spouse name: N/A  . Number of children: 0  . Years of education: HS    Occupational History  . furniture sales     Social History Main Topics  . Smoking status: Never Smoker  . Smokeless tobacco: Never Used  . Alcohol use 0.0 oz/week  Comment: Approximately once a month  . Drug use: No  . Sexual activity: Yes   Other Topics Concern  . Not on file   Social History Narrative   Lives w/ partner Arville Go.   Right-handed.   2 cups coffee daily.    Review of Systems:    Constitutional: No weight loss, fever or chills Skin: No rash Cardiovascular: Positive for chest pain Respiratory: No SOB  Gastrointestinal: See HPI and otherwise negative Genitourinary: No dysuria  Neurological: No headache, dizziness or syncope Musculoskeletal: No new muscle or joint pain Hematologic: No bleeding or bruising Psychiatric: Positive history of anxiety   Physical Exam:  Vital signs: BP 124/80   Pulse 79   Ht 5\' 7"  (1.702 m)   Wt 176 lb 2 oz (79.9 kg)   SpO2 97%   BMI 27.59 kg/m   Constitutional:   Pleasant Caucasian male appears to be in NAD, Well developed, Well nourished, alert and cooperative Head:  Normocephalic and atraumatic. Eyes:   PEERL, EOMI. No icterus. Conjunctiva pink. Ears:  Normal auditory acuity. Neck:  Supple Throat: Oral cavity and pharynx without inflammation, swelling or lesion.  Respiratory: Respirations even and unlabored. Lungs clear to auscultation bilaterally.   No wheezes, crackles, or rhonchi.  Cardiovascular: Normal S1, S2. No MRG. Regular rate and rhythm. No peripheral edema, cyanosis or pallor.  Gastrointestinal:  Soft, nondistended, nontender. No rebound or guarding. Normal bowel sounds. No appreciable masses or hepatomegaly. Rectal:  Not performed.  Msk:  Symmetrical without gross deformities. Without edema, no deformity or joint abnormality.  Neurologic:  Alert and  oriented x4;  grossly normal neurologically.  Skin:   Dry and intact without significant lesions or rashes. Psychiatric:  Demonstrates good judgement  and reason without abnormal affect or behaviors.  MOST RECENT LABS AND IMAGING: CBC    Component Value Date/Time   WBC 8.6 06/20/2016 1546   RBC 5.16 06/20/2016 1546   HGB 16.3 06/20/2016 1546   HCT 46.6 06/20/2016 1546   PLT 317 06/20/2016 1546   MCV 90.3 06/20/2016 1546   MCH 31.6 06/20/2016 1546   MCHC 35.0 06/20/2016 1546   RDW 12.8 06/20/2016 1546   LYMPHSABS 2.1 10/12/2015 0940   MONOABS 0.4 10/12/2015 0940   EOSABS 0.1 10/12/2015 0940   BASOSABS 0.0 10/12/2015 0940    CMP     Component Value Date/Time   NA 139 06/20/2016 1546   K 3.8 06/20/2016 1546   CL 102 06/20/2016 1546   CO2 30 06/20/2016 1546   GLUCOSE 109 (H) 06/20/2016 1546   BUN 15 06/20/2016 1546   CREATININE 1.48 (H) 06/20/2016 1546   CALCIUM 9.3 06/20/2016 1546   PROT 7.6 06/20/2016 1546   ALBUMIN 4.2 06/20/2016 1546   AST 20 06/20/2016 1546   ALT 18 06/20/2016 1546   ALKPHOS 41 06/20/2016 1546   BILITOT 0.9 06/20/2016 1546   GFRNONAA 52 (L) 06/20/2016 1546   GFRAA 60 (L) 06/20/2016 1546    Assessment: 1. GERD: Increased over the past few months after patient's mother had a heart attack, patient does note that Xanax has helped as well, likely this is a combination of things including stress and anxiety as well as reflux 2. Atypical chest pain: Been fully evaluated by cardiology including a stress test, this is all normal, likely related to above 3. Bilateral lower abdominal pain: Likely related to IBS  Plan: 1. Recommend patient continue his AcipHex 30 mg daily, 30-60 minutes before eating dinner 2. Provided the patient  with an antireflux diet and lifestyle handout also discussed this in the room. Patient verbalized understanding. 3. Discussed patient's lower abdominal pain and how this is likely related to IBS 4. Scheduled patient for an EGD for further evaluation of his atypical chest pain relieved with AcipHex. This is more for peace of mind for the patient as he is severely anxious. Discussed  risks, benefits, limitations and alternatives the patient agrees to proceed 5. Patient to follow in clinic per recommendations from Dr. Fuller Plan after time of procedure.  Ellouise Newer, PA-C [facility] 07/11/2016, 11:27 AM  Cc: Midge Minium, MD

## 2016-07-12 ENCOUNTER — Ambulatory Visit (AMBULATORY_SURGERY_CENTER): Payer: BLUE CROSS/BLUE SHIELD | Admitting: Gastroenterology

## 2016-07-12 ENCOUNTER — Encounter: Payer: Self-pay | Admitting: Gastroenterology

## 2016-07-12 VITALS — BP 120/75 | HR 65 | Temp 98.0°F | Resp 11 | Ht 67.0 in | Wt 176.0 lb

## 2016-07-12 DIAGNOSIS — R079 Chest pain, unspecified: Secondary | ICD-10-CM | POA: Diagnosis not present

## 2016-07-12 DIAGNOSIS — K319 Disease of stomach and duodenum, unspecified: Secondary | ICD-10-CM | POA: Diagnosis not present

## 2016-07-12 DIAGNOSIS — K219 Gastro-esophageal reflux disease without esophagitis: Secondary | ICD-10-CM

## 2016-07-12 DIAGNOSIS — K2970 Gastritis, unspecified, without bleeding: Secondary | ICD-10-CM | POA: Diagnosis not present

## 2016-07-12 MED ORDER — GLYCOPYRROLATE 2 MG PO TABS: 2.0000 mg | ORAL_TABLET | Freq: Two times a day (BID) | ORAL | 2 refills | Status: DC

## 2016-07-12 MED ORDER — SODIUM CHLORIDE 0.9 % IV SOLN: 500.0000 mL | INTRAVENOUS | Status: DC

## 2016-07-12 NOTE — Patient Instructions (Signed)
Discharge instructions given. Biopsies taken. Resume previous medications. YOU HAD AN ENDOSCOPIC PROCEDURE TODAY AT THE [facility]:   Refer to the procedure report that was given to you for any specific questions about what was found during the examination.  If the procedure report does not answer your questions, please call your gastroenterologist to clarify.  If you requested that your care partner not be given the details of your procedure findings, then the procedure report has been included in a sealed envelope for you to review at your convenience later.  YOU SHOULD EXPECT: Some feelings of bloating in the abdomen. Passage of more gas than usual.  Walking can help get rid of the air that was put into your GI tract during the procedure and reduce the bloating. If you had a lower endoscopy (such as a colonoscopy or flexible sigmoidoscopy) you may notice spotting of blood in your stool or on the toilet paper. If you underwent a bowel prep for your procedure, you may not have a normal bowel movement for a few days.  Please Note:  You might notice some irritation and congestion in your nose or some drainage.  This is from the oxygen used during your procedure.  There is no need for concern and it should clear up in a day or so.  SYMPTOMS TO REPORT IMMEDIATELY:   Following upper endoscopy (EGD)  Vomiting of blood or coffee ground material  New chest pain or pain under the shoulder blades  Painful or persistently difficult swallowing  New shortness of breath  Fever of 100F or higher  Black, tarry-looking stools  For urgent or emergent issues, a gastroenterologist can be reached at any hour by calling [phone].   DIET:  We do recommend a small meal at first, but then you may proceed to your regular diet.  Drink plenty of fluids but you should avoid alcoholic beverages for 24 hours.  ACTIVITY:  You should plan to take it easy for the rest of today and you should NOT DRIVE  or use heavy machinery until tomorrow (because of the sedation medicines used during the test).    FOLLOW UP: Our staff will call the number listed on your records the next business day following your procedure to check on you and address any questions or concerns that you may have regarding the information given to you following your procedure. If we do not reach you, we will leave a message.  However, if you are feeling well and you are not experiencing any problems, there is no need to return our call.  We will assume that you have returned to your regular daily activities without incident.  If any biopsies were taken you will be contacted by phone or by letter within the next 1-3 weeks.  Please call us at [phone] if you have not heard about the biopsies in 3 weeks.    SIGNATURES/CONFIDENTIALITY: You and/or your care partner have signed paperwork which will be entered into your electronic medical record.  These signatures attest to the fact that that the information above on your After Visit Summary has been reviewed and is understood.  Full responsibility of the confidentiality of this discharge information lies with you and/or your care-partner. 

## 2016-07-12 NOTE — Progress Notes (Signed)
Called to room to assist during endoscopic procedure.  Patient ID and intended procedure confirmed with present staff. Received instructions for my participation in the procedure from the performing physician.  

## 2016-07-12 NOTE — Op Note (Signed)
[place] Patient Name: Jon Richardson Procedure Date: 07/12/2016 1:29 PM MRN: [id number] Endoscopist: Ladene Artist , MD Age: 56 Referring MD:  Date of Birth: 08-15-1960 Gender: Male Account #: [id number] Procedure:                Upper GI endoscopy Indications:              Gastro-esophageal reflux disease, Unexplained chest                            pain Medicines:                Monitored Anesthesia Care Procedure:                Pre-Anesthesia Assessment:                           - Prior to the procedure, a History and Physical                            was performed, and patient medications and                            allergies were reviewed. The patient's tolerance of                            previous anesthesia was also reviewed. The risks                            and benefits of the procedure and the sedation                            options and risks were discussed with the patient.                            All questions were answered, and informed consent                            was obtained. Prior Anticoagulants: The patient has                            taken no previous anticoagulant or antiplatelet                            agents. ASA Grade Assessment: II - A patient with                            mild systemic disease. After reviewing the risks                            and benefits, the patient was deemed in                            satisfactory condition to undergo the procedure.  After obtaining informed consent, the endoscope was                            passed under direct vision. Throughout the                            procedure, the patient's blood pressure, pulse, and                            oxygen saturations were monitored continuously. The                            Endoscope was introduced through the mouth, and                            advanced to the second part of duodenum. The  upper                            GI endoscopy was accomplished without difficulty.                            The patient tolerated the procedure well. Scope In: Scope Out: Findings:                 The examined esophagus was normal.                           A few localized, small non-bleeding erosions were                            found in the gastric antrum and body. There were no                            stigmata of recent bleeding. Biopsies were taken                            with a cold forceps for histology.                           The exam of the stomach was otherwise normal.                           The duodenal bulb and second portion of the                            duodenum were normal. Complications:            No immediate complications. Estimated Blood Loss:     Estimated blood loss was minimal. Impression:               - Normal esophagus.                           - Non-bleeding, mild erosive gastropathy. Biopsied.                           -  Normal duodenal bulb and second portion of the                            duodenum. Recommendation:           - Patient has a contact number available for                            emergencies. The signs and symptoms of potential                            delayed complications were discussed with the                            patient. Return to normal activities tomorrow.                            Written discharge instructions were provided to the                            patient.                           - Resume previous diet with antireflux measures.                           - Continue present medications.                           - Await pathology results. Ladene Artist, MD 07/12/2016 1:46:50 PM This report has been signed electronically.

## 2016-07-12 NOTE — Progress Notes (Signed)
A and O x3. Report to RN. Tolerated MAC anesthesia well.Teeth unchanged after procedure.

## 2016-07-13 ENCOUNTER — Telehealth: Payer: Self-pay

## 2016-07-13 NOTE — Telephone Encounter (Signed)
  Follow up Call-  Call back number 07/12/2016  Post procedure Call Back phone  # [phone]  Permission to leave phone message Yes  Some recent data might be hidden     Patient questions:  Do you have a fever, pain , or abdominal swelling? No. Pain Score  0 *  Have you tolerated food without any problems? Yes.    Have you been able to return to your normal activities? Yes.    Do you have any questions about your discharge instructions: Diet   No. Medications  No. Follow up visit  No.  Do you have questions or concerns about your Care? No.  Actions: * If pain score is 4 or above: No action needed, pain <4.  Per pt no problems noted. maw

## 2016-07-19 ENCOUNTER — Telehealth: Payer: Self-pay | Admitting: Gastroenterology

## 2016-07-19 NOTE — Telephone Encounter (Signed)
I discussed with the pt how he taking his aciphex and zantac as well as robinul.  He was not taking aciphex and was directed to take it 30 min prior to dinner, zantac at bedtime and robinul twice daily.  He will call back if this does not help his symptoms.  He was also given reflux precautions at his office visit and verbally on the phone today as well.

## 2016-07-22 ENCOUNTER — Encounter: Payer: Self-pay | Admitting: Gastroenterology

## 2016-07-25 ENCOUNTER — Telehealth: Payer: Self-pay | Admitting: Gastroenterology

## 2016-07-25 NOTE — Telephone Encounter (Signed)
All questions answered.  He will call back for additional questions or concerns.

## 2016-07-29 ENCOUNTER — Other Ambulatory Visit: Payer: Self-pay | Admitting: Family Medicine

## 2016-08-01 ENCOUNTER — Other Ambulatory Visit: Payer: Self-pay | Admitting: Family Medicine

## 2016-08-01 NOTE — Telephone Encounter (Signed)
Pt was seeing Dr Jacqlyn Larsen for urology- he should get this from him

## 2016-08-01 NOTE — Telephone Encounter (Signed)
Last OV 06/22/16 Testosterone last filled 11/25/15 99mL with 3 refills   Pt last testosterone level last checked on 09/09/15 (190)

## 2016-09-16 LAB — BASIC METABOLIC PANEL
BUN: 13 (ref 4–21)
Creatinine: 1.1 (ref 0.6–1.3)
GLUCOSE: 110
POTASSIUM: 4.6 (ref 3.4–5.3)
SODIUM: 138 (ref 137–147)

## 2016-09-16 LAB — HEPATIC FUNCTION PANEL
ALK PHOS: 36 (ref 25–125)
ALT: 16 (ref 10–40)
AST: 15 (ref 14–40)
Bilirubin, Total: 0.6

## 2016-09-16 LAB — PSA: PSA: 1.1

## 2016-09-16 LAB — HEMOGLOBIN A1C: Hemoglobin A1C: 6.8

## 2016-10-04 ENCOUNTER — Ambulatory Visit (INDEPENDENT_AMBULATORY_CARE_PROVIDER_SITE_OTHER): Payer: BLUE CROSS/BLUE SHIELD | Admitting: Physician Assistant

## 2016-10-04 ENCOUNTER — Encounter: Payer: Self-pay | Admitting: Physician Assistant

## 2016-10-04 VITALS — BP 118/76 | HR 77 | Temp 98.9°F | Resp 16 | Ht 67.0 in | Wt 175.0 lb

## 2016-10-04 DIAGNOSIS — B9789 Other viral agents as the cause of diseases classified elsewhere: Secondary | ICD-10-CM | POA: Diagnosis not present

## 2016-10-04 DIAGNOSIS — J069 Acute upper respiratory infection, unspecified: Secondary | ICD-10-CM | POA: Diagnosis not present

## 2016-10-04 MED ORDER — BENZONATATE 100 MG PO CAPS: 100.0000 mg | ORAL_CAPSULE | Freq: Two times a day (BID) | ORAL | 0 refills | Status: DC | PRN

## 2016-10-04 NOTE — Patient Instructions (Signed)
Please stay well-hydrated and get plenty of rest. Start a saline nasal rinse. Continue your allergy medications. Use the Mucinex-DM over the counter for daytime cough/congestion.  Use Tessalon as directed for nighttime cough. It may make you drowsy. Continue zinc supplement. Consider echinacea supplement.  Follow-up if symptoms are not resolving.    Viral Respiratory Infection A viral respiratory infection is an illness that affects parts of the body used for breathing, like the lungs, nose, and throat. It is caused by a germ called a virus. Some examples of this kind of infection are:  A cold.  The flu (influenza).  A respiratory syncytial virus (RSV) infection.  How do I know if I have this infection? Most of the time this infection causes:  A stuffy or runny nose.  Yellow or green fluid in the nose.  A cough.  Sneezing.  Tiredness (fatigue).  Achy muscles.  A sore throat.  Sweating or chills.  A fever.  A headache.  How is this infection treated? If the flu is diagnosed early, it may be treated with an antiviral medicine. This medicine shortens the length of time a person has symptoms. Symptoms may be treated with over-the-counter and prescription medicines, such as:  Expectorants. These make it easier to cough up mucus.  Decongestant nasal sprays.  Doctors do not prescribe antibiotic medicines for viral infections. They do not work with this kind of infection. How do I know if I should stay home? To keep others from getting sick, stay home if you have:  A fever.  A lasting cough.  A sore throat.  A runny nose.  Sneezing.  Muscles aches.  Headaches.  Tiredness.  Weakness.  Chills.  Sweating.  An upset stomach (nausea).  Follow these instructions at home:  Rest as much as possible.  Take over-the-counter and prescription medicines only as told by your doctor.  Drink enough fluid to keep your pee (urine) clear or pale  yellow.  Gargle with salt water. Do this 3-4 times per day or as needed. To make a salt-water mixture, dissolve -1 tsp of salt in 1 cup of warm water. Make sure the salt dissolves all the way.  Use nose drops made from salt water. This helps with stuffiness (congestion). It also helps soften the skin around your nose.  Do not drink alcohol.  Do not use tobacco products, including cigarettes, chewing tobacco, and e-cigarettes. If you need help quitting, ask your doctor. Get help if:  Your symptoms last for 10 days or longer.  Your symptoms get worse over time.  You have a fever.  You have very bad pain in your face or forehead.  Parts of your jaw or neck become very swollen. Get help right away if:  You feel pain or pressure in your chest.  You have shortness of breath.  You faint or feel like you will faint.  You keep throwing up (vomiting).  You feel confused. This information is not intended to replace advice given to you by your health care provider. Make sure you discuss any questions you have with your health care provider. Document Released: 03/24/2008 Document Revised: 09/17/2015 Document Reviewed: 09/17/2014 Elsevier Interactive Patient Education  2018 [employer].

## 2016-10-04 NOTE — Progress Notes (Signed)
Patient presents to clinic today c/o 2 days of cough that is mainly non-productive. Denies nasal congestion, sinus pressure, tooth pain. Does note some mild ear pain Lthis morning. Denies ear pressure/popping.  Also notes mild sinus headache today. Denies fever, chills. Denies hemoptysis. Denies history of allergy or asthma.. Denies recent travel or sick contact.   Past Medical History:  Diagnosis Date  . Anxiety   . BCC (basal cell carcinoma of skin) 03-2013   scalp  . BPH (benign prostatic hyperplasia)   . Cervical pain (neck)   . Diverticulosis   . Fatty liver   . GERD   . Headache   . HERPES SIMPLEX, UNCOMPLICATED   . History of exercise stress test    ETT 3/18: Ex 10'02", normal BP response, no ST changes; neg adequate ETT  . Irritable bowel syndrome (IBS)    WITH DIARRHEA  . LUMBAR RADICULOPATHY, LEFT   . 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 Delice Lesch  . Other and unspecified hyperlipidemia   . Other chronic sinusitis   . Other testicular hypofunction   . PANIC ATTACK, ACUTE     Current Outpatient Prescriptions on File Prior to Visit  Medication Sig Dispense Refill  . ALPRAZolam (XANAX) 1 MG tablet Take 0.5-1 tablets (0.5-1 mg total) by mouth daily as needed. 30 tablet 0  . Ascorbic Acid (VITAMIN C) 1000 MG tablet Take 1,000 mg by mouth daily.    Marland Kitchen aspirin 81 MG tablet Take 81 mg by mouth daily.    . B Complex-C (SUPER B COMPLEX PO) Take 2 tablets by mouth daily.    . cetirizine (ZYRTEC) 10 MG tablet Take 1 tablet (10 mg total) by mouth daily. 30 tablet 11  . Cholecalciferol (VITAMIN D3) 5000 UNITS CAPS Take 1 capsule by mouth daily.    . fluticasone (FLONASE) 50 MCG/ACT nasal spray Place 2 sprays into both nostrils daily. 16 g 6  . glycopyrrolate (ROBINUL-FORTE) 2 MG tablet Take 1 tablet (2 mg total) by mouth 2 (two) times daily. 60 tablet 2  . HYDROcodone-acetaminophen (NORCO) 10-325 MG tablet Take 1 tablet by mouth every 6 (six) hours as needed.  Reported on 08/11/2015    . Magnesium 500 MG CAPS Take 1,000 mg by mouth daily.    . Needle, Disp, (HYPODERMIC NEEDLE 23GX1") 23G X 1" MISC Please use one needle each time testosterone is given. 25 each 1  . NEEDLE, DISP, 18 G 18G X 1" MISC Use one needle each time testosterone is drawn for injection. 25 each 12  . Omega 3 1000 MG CAPS Take by mouth.    Marland Kitchen OVER THE COUNTER MEDICATION [employer] triple action joint    . RABEprazole (ACIPHEX) 20 MG tablet Take 20 mg by mouth daily.    . ranitidine (ZANTAC) 150 MG tablet Take 1 tablet (150 mg total) by mouth 2 (two) times daily. 60 tablet 0  . Red Yeast Rice Extract (RED YEAST RICE PO) Take 2,400 mg by mouth daily.    . Syringe, Disposable, 3 ML MISC Please use one syringe each time testosterone injection is given. 25 each 1  . testosterone cypionate (DEPOTESTOSTERONE CYPIONATE) 200 MG/ML injection Inject 1 mL (200 mg total) into the muscle every 14 (fourteen) days. 10 mL 3  . valACYclovir (VALTREX) 500 MG tablet TAKE ONE TABLET BY MOUTH DAILY 30 tablet 4  . Zinc 50 MG CAPS Take by mouth.     Current Facility-Administered Medications on File Prior  to Visit  Medication Dose Route Frequency Provider Last Rate Last Dose  . 0.9 %  sodium chloride infusion  500 mL Intravenous Continuous Ladene Artist, MD        Allergies  Allergen Reactions  . Erythromycin Diarrhea    abd cramps    Family History  Problem Relation Age of Onset  . Heart disease Mother   . Heart attack Mother   . Prostate cancer Father   . Lung cancer Father   . Colon cancer Paternal Grandmother   . Heart attack Paternal Grandfather   . Heart disease Maternal Grandmother   . Prostate cancer Paternal Uncle   . Cancer Maternal Uncle        prostate cancer  . Esophageal cancer Neg Hx   . Rectal cancer Neg Hx   . Stomach cancer Neg Hx     Social History   Social History  . Marital status: Single    Spouse name: N/A  . Number of children: 0  . Years of education: HS    Occupational History  . furniture sales     Social History Main Topics  . Smoking status: Never Smoker  . Smokeless tobacco: Never Used  . Alcohol use 0.0 oz/week     Comment: Approximately once a month  . Drug use: No  . Sexual activity: Yes   Other Topics Concern  . None   Social History Narrative   Lives w/ partner Arville Go.   Right-handed.   2 cups coffee daily.   Review of Systems - See HPI.  All other ROS are negative.  BP 118/76   Pulse 77   Temp 98.9 F (37.2 C) (Oral)   Resp 16   Ht 5\' 7"  (1.702 m)   Wt 175 lb (79.4 kg)   SpO2 98%   BMI 27.41 kg/m   Physical Exam  Constitutional: He is oriented to person, place, and time and well-developed, well-nourished, and in no distress.  HENT:  Head: Normocephalic and atraumatic.  Right Ear: Tympanic membrane and external ear normal.  Left Ear: Tympanic membrane and external ear normal.  Nose: Mucosal edema present. No rhinorrhea. Right sinus exhibits no maxillary sinus tenderness and no frontal sinus tenderness. Left sinus exhibits no maxillary sinus tenderness and no frontal sinus tenderness.  Mouth/Throat: Oropharynx is clear and moist. No oropharyngeal exudate.  Eyes: Conjunctivae are normal.  Neck: Neck supple.  Cardiovascular: Normal rate, regular rhythm, normal heart sounds and intact distal pulses.   Pulmonary/Chest: Effort normal and breath sounds normal. No respiratory distress. He has no wheezes. He has no rales. He exhibits no tenderness.  Neurological: He is alert and oriented to person, place, and time.  Skin: Skin is warm and dry. No rash noted.  Psychiatric: Affect normal.  Vitals reviewed.  Assessment/Plan: 1. Viral URI with cough Discussed supportive measures and OTC medications. Start Mucinex-DM. Start Tessalon as directed. Follow-up if not improving.     Leeanne Rio, PA-C

## 2016-10-04 NOTE — Progress Notes (Signed)
Pre visit review using our clinic review tool, if applicable. No additional management support is needed unless otherwise documented below in the visit note. 

## 2016-10-05 ENCOUNTER — Telehealth: Payer: Self-pay | Admitting: Family Medicine

## 2016-10-05 NOTE — Telephone Encounter (Signed)
Patient Name: Jon Richardson  DOB: 1960-05-13    Initial Comment Caller states was in yesterday and saw PA; no fever yesterday; fever began last night and has creeped to over 100 today; couple of months ago bitten by tick; has body aches;    Nurse Assessment  Nurse: Thad Ranger, RN, Langley Gauss Date/Time ([place] Time): 10/05/2016 3:08:04 PM  Confirm and document reason for call. If symptomatic, describe symptoms. ---Caller states was in yesterday and saw PA for possible Bronchitis. Diag with virus and given cough med, Nasonex. Fever is 100.3 an hr ago. He was bitten by a tick 2 mos ago, but the tick had not been attached very long. He has body aches, but he worked in the yard yesterday and not sure if body aches are from the tick bite or activity yesterday. Denies HA or rash. He is coughing and pulled his back causing pain in his back. He has been coughing hard today.  Does the patient have any new or worsening symptoms? ---Yes  Will a triage be completed? ---Yes  Related visit to physician within the last 2 weeks? ---Yes  Does the PT have any chronic conditions? (i.e. diabetes, asthma, etc.) ---No  Is this a behavioral health or substance abuse call? ---No     Guidelines    Guideline Title Affirmed Question Affirmed Notes  Cough - Acute Non-Productive Cough    Final Disposition User   [place], RN, Langley Gauss    Comments  Based on RN clinical knowledge, advised infectious tick bites can cause a rash, fever, body aches and possible bulls eye rash within 2-14 days after the bite. Advised it is possible the body aches that started yesterday are do to working in yard and fever due to virus diag by PA yesterday. Advised may take Ibuprophen 400mg  po q 6 hrs or 600mg  po q 8 hrs prn and report above s/s mentioned r/t tick bite if this occurs. Advised to take the cough med and Mucinex a/o by PA. Verb understanding.   Disagree/Comply: Comply

## 2016-10-05 NOTE — Telephone Encounter (Signed)
Please call patient tomorrow to assess how he is feeling. See no major concern for tick borne illness causing his respiratory symptoms.

## 2016-10-06 NOTE — Telephone Encounter (Signed)
LMOVM advising to call back with current symptoms and how is he feeling

## 2016-10-07 MED ORDER — DOXYCYCLINE HYCLATE 100 MG PO CAPS: 100.0000 mg | ORAL_CAPSULE | Freq: Two times a day (BID) | ORAL | 0 refills | Status: DC

## 2016-10-07 NOTE — Telephone Encounter (Signed)
Patient aware of abx sent to the pharmacy

## 2016-10-07 NOTE — Telephone Encounter (Signed)
Patient states he had a fever on Tues, Wed, Thur. Having chest congestion, cough with yellow/green sputum. Taking Advil and Mucinex  Please advised

## 2016-10-07 NOTE — Telephone Encounter (Signed)
Rx Doxycycline sent to pharmacy. Take as directed. Continue supportive measures.

## 2016-10-13 ENCOUNTER — Ambulatory Visit (INDEPENDENT_AMBULATORY_CARE_PROVIDER_SITE_OTHER): Payer: BLUE CROSS/BLUE SHIELD | Admitting: Family Medicine

## 2016-10-13 ENCOUNTER — Encounter: Payer: Self-pay | Admitting: Family Medicine

## 2016-10-13 VITALS — BP 121/78 | HR 76 | Temp 98.5°F | Resp 16 | Ht 67.0 in | Wt 176.4 lb

## 2016-10-13 DIAGNOSIS — Z0000 Encounter for general adult medical examination without abnormal findings: Secondary | ICD-10-CM | POA: Diagnosis not present

## 2016-10-13 DIAGNOSIS — R7989 Other specified abnormal findings of blood chemistry: Secondary | ICD-10-CM

## 2016-10-13 DIAGNOSIS — R7303 Prediabetes: Secondary | ICD-10-CM | POA: Diagnosis not present

## 2016-10-13 LAB — HEPATIC FUNCTION PANEL
ALK PHOS: 38 U/L — AB (ref 39–117)
ALT: 17 U/L (ref 0–53)
AST: 15 U/L (ref 0–37)
Albumin: 4.5 g/dL (ref 3.5–5.2)
Bilirubin, Direct: 0.1 mg/dL (ref 0.0–0.3)
Total Bilirubin: 0.5 mg/dL (ref 0.2–1.2)
Total Protein: 6.8 g/dL (ref 6.0–8.3)

## 2016-10-13 LAB — BASIC METABOLIC PANEL
BUN: 14 mg/dL (ref 6–23)
CALCIUM: 9.9 mg/dL (ref 8.4–10.5)
CO2: 30 meq/L (ref 19–32)
Chloride: 102 mEq/L (ref 96–112)
Creatinine, Ser: 1.07 mg/dL (ref 0.40–1.50)
GFR: 75.94 mL/min (ref >60.00)
GLUCOSE: 108 mg/dL — AB (ref 70–99)
POTASSIUM: 4.8 meq/L (ref 3.5–5.1)
Sodium: 137 mEq/L (ref 135–145)

## 2016-10-13 LAB — CBC WITH DIFFERENTIAL/PLATELET
Basophils Absolute: 0 10*3/uL (ref 0.0–0.1)
Basophils Relative: 0.6 % (ref 0.0–3.0)
EOS PCT: 1.3 % (ref 0.0–5.0)
Eosinophils Absolute: 0.1 10*3/uL (ref 0.0–0.7)
HEMATOCRIT: 45 % (ref 39.0–52.0)
HEMOGLOBIN: 15.3 g/dL (ref 13.0–17.0)
LYMPHS PCT: 36.7 % (ref 12.0–46.0)
Lymphs Abs: 2.6 10*3/uL (ref 0.7–4.0)
MCHC: 33.9 g/dL (ref 30.0–36.0)
MCV: 92.1 fl (ref 78.0–100.0)
MONO ABS: 0.5 10*3/uL (ref 0.1–1.0)
Monocytes Relative: 7.2 % (ref 3.0–12.0)
NEUTROS ABS: 3.8 10*3/uL (ref 1.4–7.7)
Neutrophils Relative %: 54.2 % (ref 43.0–77.0)
Platelets: 429 10*3/uL — ABNORMAL HIGH (ref 150.0–400.0)
RBC: 4.89 Mil/uL (ref 4.22–5.81)
RDW: 14.3 % (ref 11.5–15.5)
WBC: 7.1 10*3/uL (ref 4.0–10.5)

## 2016-10-13 LAB — LDL CHOLESTEROL, DIRECT: Direct LDL: 114 mg/dL

## 2016-10-13 LAB — LIPID PANEL
CHOLESTEROL: 208 mg/dL — AB (ref 0–200)
HDL: 38 mg/dL — AB (ref >39.00)
NonHDL: 170.13
Total CHOL/HDL Ratio: 5
Triglycerides: 233 mg/dL — ABNORMAL HIGH (ref 0.0–149.0)
VLDL: 46.6 mg/dL — AB (ref 0.0–40.0)

## 2016-10-13 LAB — TSH: TSH: 2.83 u[IU]/mL (ref 0.35–4.50)

## 2016-10-13 NOTE — Progress Notes (Signed)
   Subjective:    Patient ID: Jon Hipp., male    DOB: Jan 08, 1961, 56 y.o.   MRN: [id number]  HPI CPE- UTD on colonoscopy, urology, TDap.     Review of Systems Patient reports no vision/hearing changes, anorexia, fever ,adenopathy, persistant/recurrent hoarseness, swallowing issues, chest pain, palpitations, edema, persistant/recurrent cough, hemoptysis, dyspnea (rest,exertional, paroxysmal nocturnal), gastrointestinal  bleeding (melena, rectal bleeding), abdominal pain, excessive heart burn, GU symptoms (dysuria, hematuria, voiding/incontinence issues) syncope, focal weakness, memory loss, numbness & tingling, skin/hair/nail changes, depression, anxiety, abnormal bruising/bleeding, musculoskeletal symptoms/signs.     Objective:   Physical Exam General Appearance:    Alert, cooperative, no distress, appears stated age  Head:    Normocephalic, without obvious abnormality, atraumatic  Eyes:    PERRL, conjunctiva/corneas clear, EOM's intact, fundi    benign, both eyes       Ears:    Normal TM's and external ear canals, both ears  Nose:   Nares normal, septum midline, mucosa normal, no drainage   or sinus tenderness  Throat:   Lips, mucosa, and tongue normal; teeth and gums normal  Neck:   Supple, symmetrical, trachea midline, no adenopathy;       thyroid:  No enlargement/tenderness/nodules  Back:     Symmetric, no curvature, ROM normal, no CVA tenderness  Lungs:     Clear to auscultation bilaterally, respirations unlabored  Chest wall:    No tenderness or deformity  Heart:    Regular rate and rhythm, S1 and S2 normal, no murmur, rub   or gallop  Abdomen:     Soft, non-tender, bowel sounds active all four quadrants,    no masses, no organomegaly  Genitalia:    Deferred to urology  Rectal:    Extremities:   Extremities normal, atraumatic, no cyanosis or edema  Pulses:   2+ and symmetric all extremities  Skin:   Skin color, texture, turgor normal, no rashes or lesions    Lymph nodes:   Cervical, supraclavicular, and axillary nodes normal  Neurologic:   CNII-XII intact. Normal strength, sensation and reflexes      throughout          Assessment & Plan:

## 2016-10-13 NOTE — Progress Notes (Signed)
Pre visit review using our clinic review tool, if applicable. No additional management support is needed unless otherwise documented below in the visit note. 

## 2016-10-13 NOTE — Assessment & Plan Note (Signed)
Pt's PE WNL.  UTD on colonoscopy, urology, immunizations.  Check labs.  Anticipatory guidance provided.  

## 2016-10-13 NOTE — Patient Instructions (Signed)
Follow up in 6 months to recheck sugar We'll notify you of your lab results and make any changes if needed Continue to work on healthy diet and regular exercise- you can do it! You are up to date on immunizations and colonoscopy- yay! Call with any questions or concerns Have a great summer!!!

## 2016-10-14 ENCOUNTER — Other Ambulatory Visit: Payer: Self-pay | Admitting: General Practice

## 2016-10-14 MED ORDER — FENOFIBRATE 160 MG PO TABS: 160.0000 mg | ORAL_TABLET | Freq: Every day | ORAL | 6 refills | Status: DC

## 2016-10-17 ENCOUNTER — Encounter: Payer: Self-pay | Admitting: Family Medicine

## 2016-11-08 ENCOUNTER — Telehealth: Payer: Self-pay | Admitting: *Deleted

## 2016-11-08 NOTE — Telephone Encounter (Signed)
Patient called stating when he checks his blood pressure it is always high, patient said it doesn't matter how many times he checks it, it stays high. Patient states when his blood pressure is checked at our office it is normal. Patient is concerned on what to do and requested for the nurse to call him back. Please advise

## 2016-11-09 LAB — HM DIABETES EYE EXAM

## 2016-11-09 NOTE — Telephone Encounter (Signed)
Could you call and see if pt is having any other symptoms? What are his readings?

## 2016-11-09 NOTE — Telephone Encounter (Signed)
Spoke with patient regarding blood pressure. Patient states when he checks his BP before and after his workout (required at [facility] prior to exercise), his readings are 140's/80-90's. Also higher when he checks with his home BP cuff. Patient denies headache, visual changes, chest pain or SOB. Patient concerned this could be anxiety or hormone replacement related. Patient has appointment with Urology next week, requested appt with PCP to discuss. Patient scheduled f/u visit with PCP on 11/21/2016. Instructed to bring his electronic BP cuff to appointment. Advised if he begins to have any other symptoms, to call for sooner appointment or go to Urgent Care. Patient verbalized understanding.

## 2016-11-14 ENCOUNTER — Encounter: Payer: Self-pay | Admitting: Family Medicine

## 2016-11-14 ENCOUNTER — Ambulatory Visit (INDEPENDENT_AMBULATORY_CARE_PROVIDER_SITE_OTHER): Payer: BLUE CROSS/BLUE SHIELD | Admitting: Family Medicine

## 2016-11-14 VITALS — BP 131/86 | HR 76 | Temp 98.7°F | Resp 16 | Ht 67.0 in | Wt 176.2 lb

## 2016-11-14 DIAGNOSIS — G4700 Insomnia, unspecified: Secondary | ICD-10-CM

## 2016-11-14 DIAGNOSIS — R002 Palpitations: Secondary | ICD-10-CM | POA: Diagnosis not present

## 2016-11-14 DIAGNOSIS — R030 Elevated blood-pressure reading, without diagnosis of hypertension: Secondary | ICD-10-CM | POA: Diagnosis not present

## 2016-11-14 MED ORDER — TRAZODONE HCL 50 MG PO TABS: 25.0000 mg | ORAL_TABLET | Freq: Every evening | ORAL | 3 refills | Status: DC | PRN

## 2016-11-14 NOTE — Progress Notes (Signed)
   Subjective:    Patient ID: Jon Richardson., male    DOB: [birth year], 56 y.o.   MRN: [id number]  HPI Elevated BP- pt is going to hospital gym to exercise and they require him to check BP before and after exercise.  Readings are running 140-150s/80s.  No CP, SOB.  + family hx.  Having intermittent palpitations at night- some will wake him.  Pt admits to sleeping poorly.   Review of Systems For ROS see HPI     Objective:   Physical Exam  Constitutional: He is oriented to person, place, and time. He appears well-developed and well-nourished. No distress.  HENT:  Head: Normocephalic and atraumatic.  Eyes: Pupils are equal, round, and reactive to light. Conjunctivae and EOM are normal.  Neck: Normal range of motion. Neck supple. No thyromegaly present.  Cardiovascular: Normal rate, regular rhythm, normal heart sounds and intact distal pulses.   No murmur heard. Pulmonary/Chest: Effort normal and breath sounds normal. No respiratory distress.  Abdominal: Soft. Bowel sounds are normal. He exhibits no distension.  Musculoskeletal: He exhibits no edema.  Lymphadenopathy:    He has no cervical adenopathy.  Neurological: He is alert and oriented to person, place, and time. No cranial nerve deficit.  Skin: Skin is warm and dry.  Psychiatric: He has a normal mood and affect. His behavior is normal.  Vitals reviewed.         Assessment & Plan:  Palpitations- pt reports these will periodically wake him from sleep.  Most notable at night.  Asymptomatic at this time so no need for EKG but will get holter monitor to assess.  Reviewed recent labs, no obvious cause for palpitations.  Will follow.  Elevated BP- pt notes that BP is elevated when at the gym.  + family hx of HTN but he admits he has been sleeping very poorly recently.  Will try and address the sleep issues prior to starting BP meds as his BP has been fairly well controlled when he is in the office.  Pt expressed  understanding and is in agreement w/ plan.   Insomnia- new to provider, ongoing for pt.  Will start Trazodone as his sleep may be impacting his BP.  Reviewed importance of sleep hygiene.  Will follow.

## 2016-11-14 NOTE — Progress Notes (Signed)
Pre visit review using our clinic review tool, if applicable. No additional management support is needed unless otherwise documented below in the visit note. 

## 2016-11-14 NOTE — Patient Instructions (Signed)
Follow up in 3-4 weeks to recheck BP and insomnia We'll call you with the appointment to set up your holter monitor to assess the palpitations Start the Trazodone nightly for sleep- start w/ 1/2 tab and increase to 1 tab if needed If you are going to check your BP, check it at various times throughout the day Limit your salt intake- this elevates your BP Keep up the good work on healthy diet and regular exercise- you're doing great! Call with any questions or concerns Hang in there!  We'll figure this out!

## 2016-12-09 ENCOUNTER — Ambulatory Visit: Payer: BLUE CROSS/BLUE SHIELD | Admitting: Family Medicine

## 2016-12-19 ENCOUNTER — Other Ambulatory Visit: Payer: Self-pay | Admitting: Family Medicine

## 2017-02-20 LAB — CBC AND DIFFERENTIAL
HCT: 48 (ref 41–53)
Hemoglobin: 15.6 (ref 13.5–17.5)
Neutrophils Absolute: 3
Platelets: 352 (ref 150–399)
WBC: 6.1

## 2017-02-20 LAB — LIPID PANEL
Cholesterol: 169 (ref 0–200)
HDL: 47 (ref 35–70)
LDL CALC: 97
Triglycerides: 124 (ref 40–160)

## 2017-03-13 ENCOUNTER — Encounter: Payer: Self-pay | Admitting: Family Medicine

## 2017-03-13 ENCOUNTER — Ambulatory Visit (INDEPENDENT_AMBULATORY_CARE_PROVIDER_SITE_OTHER): Payer: BLUE CROSS/BLUE SHIELD | Admitting: Family Medicine

## 2017-03-13 ENCOUNTER — Telehealth: Payer: Self-pay | Admitting: *Deleted

## 2017-03-13 ENCOUNTER — Ambulatory Visit (INDEPENDENT_AMBULATORY_CARE_PROVIDER_SITE_OTHER): Payer: BLUE CROSS/BLUE SHIELD

## 2017-03-13 VITALS — BP 132/70 | HR 87 | Temp 98.6°F | Resp 15 | Wt 177.5 lb

## 2017-03-13 DIAGNOSIS — S6990XA Unspecified injury of unspecified wrist, hand and finger(s), initial encounter: Secondary | ICD-10-CM

## 2017-03-13 DIAGNOSIS — W108XXA Fall (on) (from) other stairs and steps, initial encounter: Secondary | ICD-10-CM

## 2017-03-13 DIAGNOSIS — IMO0001 Reserved for inherently not codable concepts without codable children: Secondary | ICD-10-CM

## 2017-03-13 DIAGNOSIS — S6991XA Unspecified injury of right wrist, hand and finger(s), initial encounter: Secondary | ICD-10-CM

## 2017-03-13 DIAGNOSIS — S7012XA Contusion of left thigh, initial encounter: Secondary | ICD-10-CM

## 2017-03-13 MED ORDER — TRAMADOL HCL 50 MG PO TABS: 50.0000 mg | ORAL_TABLET | Freq: Three times a day (TID) | ORAL | 0 refills | Status: DC | PRN

## 2017-03-13 NOTE — Progress Notes (Signed)
Assessment  1. Thumb injury, initial encounter   2. Quadriceps contusion, left, initial encounter   3. Fall (on) (from) other stairs and steps, initial encounter      Plan  Discussion:   Fall and quad/thumb injuries: Exam of thumb concerning for possible fracture at PIP- will send for xrays. Right thumb was placed in splint to immobilize joint. Recommended ice, nsaids and tramadol if needed for pain. Recommend f/u in 2 weeks for recheck.   Quad contusion w/o sign of tear. Has soft tissues swelling but no significant hematoma. Recommend rest, warm heat, and time. Recheck 2 weeks.   Return in about 2 weeks (around 03/27/2017) for recheck of thumb and leg contusion. or as needed for any worsening symptoms or changes in condition.   Commons side effects, risks, benefits, and alternatives for medications and treatment plan prescribed today were discussed, and the patient expressed understanding of the given instructions. Patient is instructed to call or message via MyChart if he/she has any questions or concerns regarding our treatment plan. No barriers to understanding were identified. We discussed Red Flag symptoms and signs in detail. Patient expressed understanding regarding what to do in case of urgent or emergency type symptoms.   Medication list was reconciled, printed and provided to the patient in AVS. Patient instructions and summary information was reviewed with the patient as documented in the AVS. This note was prepared with assistance of Dragon voice recognition software. Occasional wrong-word or sound-a-like substitutions may have occurred due to the inherent limitations of voice recognition software  Orders Placed This Encounter  Procedures  . DG Finger Thumb Right   Current Meds  Medication Sig  . ALPRAZolam (XANAX) 1 MG tablet Take 0.5-1 tablets (0.5-1 mg total) by mouth daily as needed.  . Ascorbic Acid (VITAMIN C) 1000 MG tablet Take 1,000 mg by mouth daily.  Marland Kitchen aspirin 81  MG tablet Take 81 mg by mouth daily.  . B Complex-C (SUPER B COMPLEX PO) Take 2 tablets by mouth daily.  . cetirizine (ZYRTEC) 10 MG tablet Take 1 tablet (10 mg total) by mouth daily.  . Cholecalciferol (VITAMIN D3) 5000 UNITS CAPS Take 1 capsule by mouth daily.  . fenofibrate 160 MG tablet Take 1 tablet (160 mg total) by mouth daily.  . fluticasone (FLONASE) 50 MCG/ACT nasal spray Place 2 sprays into both nostrils daily.  Marland Kitchen glycopyrrolate (ROBINUL-FORTE) 2 MG tablet Take 1 tablet (2 mg total) by mouth 2 (two) times daily.  . Magnesium 500 MG CAPS Take 1,000 mg by mouth daily.  . Needle, Disp, (HYPODERMIC NEEDLE 23GX1") 23G X 1" MISC Please use one needle each time testosterone is given.  Marland Kitchen NEEDLE, DISP, 18 G 18G X 1" MISC Use one needle each time testosterone is drawn for injection.  . Omega 3 1000 MG CAPS Take by mouth.  Marland Kitchen OVER THE COUNTER MEDICATION [employer] triple action joint  . RABEprazole (ACIPHEX) 20 MG tablet Take 20 mg by mouth daily.  . ranitidine (ZANTAC) 150 MG tablet Take 1 tablet (150 mg total) by mouth 2 (two) times daily.  . Red Yeast Rice Extract (RED YEAST RICE PO) Take 2,400 mg by mouth daily.  . Syringe, Disposable, 3 ML MISC Please use one syringe each time testosterone injection is given.  . testosterone cypionate (DEPOTESTOSTERONE CYPIONATE) 200 MG/ML injection Inject 1 mL (200 mg total) into the muscle every 14 (fourteen) days.  . traZODone (DESYREL) 50 MG tablet Take 0.5-1 tablets (25-50 mg total) by mouth at  bedtime as needed for sleep.  . valACYclovir (VALTREX) 500 MG tablet TAKE ONE TABLET BY MOUTH DAILY  . Zinc 50 MG CAPS Take by mouth.  . [DISCONTINUED] doxycycline (VIBRAMYCIN) 100 MG capsule Take 1 capsule (100 mg total) by mouth 2 (two) times daily.  . [DISCONTINUED] HYDROcodone-acetaminophen (NORCO) 10-325 MG tablet Take 1 tablet by mouth every 6 (six) hours as needed. Reported on 08/11/2015      Subjective   CC:  Chief Complaint  Patient presents with   . Fall    down the steps on Saturday  . Hand Injury    injured right thumb when fell  . Leg Injury    left leg    HPI: Jon Richardson. is a 56 y.o. male who presents to the office today to address the problems listed above in the chief complaint. DOI 03/11/2017  Pleasant 56 yo male who was unwrapping a live christmas tree near the steps leading to his mothers residence. He missed the steps and fell sideways landing on his left side. His left mid quadricep landed sharply along the wooden steps and was moderately painful. His right thumb landed on the concrete pavers- he was holding scissors at the time.   Since, his thumb remains painful and swollen and bruised with limited movement and use. He denies numbness or severe pain. No other pain or injury to hand is described. He has used ice and ibuprofen; he is concerned about the swelling.   His left thigh is moderately painful and the pain is limiting his function. He has increased pain with stepping up with the left leg and trying to squat down over the commode. He denies weakness, pain in hip or knee or ankle, calf swelling or pain, or radiation of pain. He is not on blood thinners.   He denies LOC.   I reviewed the patients updated PMH, FH, and SocHx.    Patient Active Problem List   Diagnosis Date Noted  . Viral URI with cough 10/04/2016  . Chest pain 06/08/2015  . Hyperlipidemia 06/08/2015  . Migraine 05/20/2015  . Generalized anxiety disorder 05/20/2015  . Transient vision disturbance of right eye 04/23/2015  . Prediabetes 11/04/2014  . Annual physical exam 09/04/2014  . Cervical disc disorder with radiculopathy of cervical region 08/29/2012  . 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 BPH (benign prostatic hyperplasia) 07/22/2008  . Back pain-chronic-status post surgery 07/22/2008  . GERD 11/12/2007  . Anxiety-   07/11/2007  . PERSISTENT DISORDER INITIATING/MAINTAINING SLEEP 05/30/2007  . OTHER CHRONIC SINUSITIS  05/30/2007  . 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 Other and unspecified hyperlipidemia 11/22/2006  . HERPES SIMPLEX, UNCOMPLICATED 16/01/9603  . IRRITABLE BOWEL SYNDROME 11/10/2006    Review of Systems: Cardiovascular: negative for chest pain Respiratory: negative for SOB or persistent cough Gastrointestinal: negative for abdominal pain Genitourinary: negative for dysuria or gross hematuria  Objective  Vitals: BP 132/70 (BP Location: Right Arm, Patient Position: Sitting, Cuff Size: Normal)   Pulse 87   Temp 98.6 F (37 C) (Oral)   Resp 15   Wt 177 lb 8 oz (80.5 kg)   SpO2 96%   BMI 27.80 kg/m  General: no acute distress  Psych:  Alert and oriented, normal mood and affect HEENT:  Normocephalic, atraumatic, supple neck MSK: right hand: thumb with distal swelling and ecchymosis, tender over PIP with limited ROM, non tender MCP, nl other fingers and wrist exam  Left thigh with soft tissue swelling and tenderness from mid-quad distally  with ecchymosis w/o hematoma. No deformity of mm palpated. Strenght in quads and hamstrings is 5/5. Nl knee appearance and ROM.   Skin:  Warm, no rashes Neurologic:   Mental status is normal. Gait- mild limp, antalgic

## 2017-03-13 NOTE — Patient Instructions (Signed)
Thumb Sprain A thumb sprain is an injury to one of the strong bands of tissue (ligaments) that connect the bones in your thumb. The ligament can be stretched too much or it can tear. A tear can be either partial or complete. The severity of the sprain depends on how much of the ligament was damaged or torn. What are the causes? A thumb sprain is often caused by a fall or an accident. If you extend your hands to catch an object or to protect yourself, the force of the impact can cause your ligament to stretch too much. This excess tension can also cause your ligament to tear. What increases the risk? This injury is more likely to occur in people who play:  Sports that involve a greater risk of falling, such as skiing.  Sports that involve catching an object, such as basketball.  What are the signs or symptoms? Symptoms of this condition include:  Loss of motion in your thumb.  Bruising.  Tenderness.  Swelling.  How is this diagnosed? This condition is diagnosed with a medical history and physical exam. You Kon also have an X-ray of your thumb. How is this treated? Treatment varies depending on the severity of your sprain. If your ligament is overstretched or partially torn, treatment usually involves keeping your thumb in a fixed position (immobilization) for a period of time. To help you do this, your health care provider will apply a bandage, cast, or splint to keep your thumb from moving until it heals. If your ligament is fully torn, you Lukach need surgery to reconnect the ligament to the bone. After surgery, a cast or splint will be applied and will need to stay on your thumb while it heals. Your health care provider Koerner also suggest exercises or physical therapy to strengthen your thumb. Follow these instructions at home: If you have a cast:  Do not stick anything inside the cast to scratch your skin. Doing that increases your risk of infection.  Check the skin around the cast  every day. Report any concerns to your health care provider. You Mckendry put lotion on dry skin around the edges of the cast. Do not apply lotion to the skin underneath the cast.  Keep the cast clean and dry. If you have a splint:  Wear it as directed by your health care provider. Remove it only as directed by your health care provider.  Loosen the splint if your fingers become numb and tingle, or if they turn cold and blue.  Keep the splint clean and dry. Bathing  Cover the bandage, cast, or splint with a watertight plastic bag to protect it from water while you take a bath or a shower. Do not let the bandage, cast, or splint get wet. Managing pain, stiffness, and swelling  If directed, apply ice to the injured area (unless you have a cast): ? Put ice in a plastic bag. ? Place a towel between your skin and the bag. ? Leave the ice on for 20 minutes, 2-3 times per day.  Move your fingers often to avoid stiffness and to lessen swelling.  Raise (elevate) the injured area above the level of your heart while you are sitting or lying down. Driving  Do not drive or operate heavy machinery while taking pain medicine.  Do not drive while wearing a cast or splint on a hand that you use for driving. General instructions  Do not put pressure on any part of your cast or splint  until it is fully hardened. This may take several hours.  Take medicines only as directed by your health care provider. These include over-the-counter medicines and prescription medicines.  Keep all follow-up visits as directed by your health care provider. This is important.  Do any exercise or physical therapy as directed by your health care provider.  Do not wear rings on your injured thumb. Contact a health care provider if:  Your pain is not controlled with medicine.  Your bruising or swelling gets worse.  Your cast or splint is damaged. Get help right away if:  Your thumb is numb or blue.  Your thumb  feels colder than normal. This information is not intended to replace advice given to you by your health care provider. Make sure you discuss any questions you have with your health care provider. Document Released: 05/19/2004 Document Revised: 12/13/2015 Document Reviewed: 01/21/2014 Elsevier Interactive Patient Education  2018 [place] A contusion is a deep bruise. Contusions happen when an injury causes bleeding under the skin. Symptoms of bruising include pain, swelling, and discolored skin. The skin may turn blue, purple, or yellow. Follow these instructions at home:  Rest the injured area.  If told, put ice on the injured area. ? Put ice in a plastic bag. ? Place a towel between your skin and the bag. ? Leave the ice on for 20 minutes, 2-3 times per day.  If told, put light pressure (compression) on the injured area using an elastic bandage. Make sure the bandage is not too tight. Remove it and put it back on as told by your doctor.  If possible, raise (elevate) the injured area above the level of your heart while you are sitting or lying down.  Take over-the-counter and prescription medicines only as told by your doctor. Contact a doctor if:  Your symptoms do not get better after several days of treatment.  Your symptoms get worse.  You have trouble moving the injured area. Get help right away if:  You have very bad pain.  You have a loss of feeling (numbness) in a hand or foot.  Your hand or foot turns pale or cold. This information is not intended to replace advice given to you by your health care provider. Make sure you discuss any questions you have with your health care provider. Document Released: 09/28/2007 Document Revised: 09/17/2015 Document Reviewed: 08/27/2014 Elsevier Interactive Patient Education  2018 [employer].

## 2017-03-13 NOTE — Telephone Encounter (Signed)
Pt saw Dr. Jonni Sanger today for an acute appt, he just wanted me to relay a message to his PCP. He wanted you to know that the fenofibrate you prescribed for his cholesterol is working well. He isn't having any side eff and he said that his urologist did recent labs and his lipid labs were in normal range. Just an FYI to PCP

## 2017-04-10 ENCOUNTER — Other Ambulatory Visit: Payer: Self-pay | Admitting: Family Medicine

## 2017-04-10 NOTE — Telephone Encounter (Signed)
Last OV 11/14/16 Alprazolam last filled 02/25/16 #30 with 0

## 2017-04-10 NOTE — Telephone Encounter (Signed)
Medication filled to pharmacy as requested.   

## 2017-04-11 ENCOUNTER — Encounter: Payer: Self-pay | Admitting: Family Medicine

## 2017-04-11 ENCOUNTER — Encounter: Payer: Self-pay | Admitting: General Practice

## 2017-04-11 ENCOUNTER — Ambulatory Visit (INDEPENDENT_AMBULATORY_CARE_PROVIDER_SITE_OTHER): Payer: BLUE CROSS/BLUE SHIELD | Admitting: Family Medicine

## 2017-04-11 ENCOUNTER — Other Ambulatory Visit: Payer: Self-pay

## 2017-04-11 VITALS — BP 126/86 | HR 62 | Temp 98.0°F | Resp 16 | Ht 67.0 in | Wt 172.5 lb

## 2017-04-11 DIAGNOSIS — E119 Type 2 diabetes mellitus without complications: Secondary | ICD-10-CM

## 2017-04-11 DIAGNOSIS — E7849 Other hyperlipidemia: Secondary | ICD-10-CM | POA: Diagnosis not present

## 2017-04-11 LAB — MICROALBUMIN / CREATININE URINE RATIO
CREATININE, U: 142.1 mg/dL
Microalb Creat Ratio: 0.5 mg/g (ref 0.0–30.0)

## 2017-04-11 LAB — BASIC METABOLIC PANEL
BUN: 20 mg/dL (ref 6–23)
CHLORIDE: 102 meq/L (ref 96–112)
CO2: 32 meq/L (ref 19–32)
CREATININE: 1.29 mg/dL (ref 0.40–1.50)
Calcium: 9.7 mg/dL (ref 8.4–10.5)
GFR: 61.09 mL/min (ref >60.00)
GLUCOSE: 103 mg/dL — AB (ref 70–99)
Potassium: 4.7 mEq/L (ref 3.5–5.1)
SODIUM: 139 meq/L (ref 135–145)

## 2017-04-11 LAB — HEMOGLOBIN A1C: Hgb A1c MFr Bld: 6.2 % (ref 4.6–6.5)

## 2017-04-11 NOTE — Patient Instructions (Signed)
Follow up in 3-4 months to recheck diabetes We'll notify you of your lab results and make any changes if needed Keep up the good work on healthy diet and regular exercise- you look great!!! If no improvement in thumb pain by the 1st of the year, let me know and we'll send you to a hand specialist Call with any questions or concerns Happy Holidays!!!

## 2017-04-11 NOTE — Progress Notes (Signed)
   Subjective:    Patient ID: Jon Hipp., male    DOB: [birth year], 56 y.o.   MRN: [id number]  HPI DM- pt's A1C in June was 6.8 w/ Dr Buddy Duty.  Due for foot exam, microalbumin.  'i'm not behaving w/ food right now b/c of the holidays'.  Pt has lost 6 lbs since last visit.  UTD on eye exam (done July 2018).  No numbness/tingling of hands or feet.  No CP, SOB, HAs, visual changes.  Hyperlipidemia- pt had labs checked in October w/ Dr Buddy Duty and were WNL.  No abd pain, N/V, myalgias.   Review of Systems For ROS see HPI     Objective:   Physical Exam  Constitutional: He is oriented to person, place, and time. He appears well-developed and well-nourished. No distress.  HENT:  Head: Normocephalic and atraumatic.  Eyes: Conjunctivae and EOM are normal. Pupils are equal, round, and reactive to light.  Neck: Normal range of motion. Neck supple. No thyromegaly present.  Cardiovascular: Normal rate, regular rhythm, normal heart sounds and intact distal pulses.  No murmur heard. Pulmonary/Chest: Effort normal and breath sounds normal. No respiratory distress.  Abdominal: Soft. Bowel sounds are normal. He exhibits no distension.  Musculoskeletal: He exhibits no edema.  Lymphadenopathy:    He has no cervical adenopathy.  Neurological: He is alert and oriented to person, place, and time. No cranial nerve deficit.  Skin: Skin is warm and dry.  Psychiatric: He has a normal mood and affect. His behavior is normal.  Vitals reviewed.         Assessment & Plan:

## 2017-04-11 NOTE — Assessment & Plan Note (Signed)
Pt's A1C in June 2018 was 6.8.  Currently diet controlled.  UTD on eye exam.  Foot exam done today and WNL.  Will get microalbumin.  Stressed need for low carb diet and regular exercise.  Check labs.  Adjust tx prn

## 2017-04-11 NOTE — Assessment & Plan Note (Signed)
Chronic problem.  Doing well on Fenofibrate.  Trigs are now normal, LDL 96.  Encouraged him to continue healthy diet and regular exercise.  Will follow.

## 2017-05-26 ENCOUNTER — Other Ambulatory Visit: Payer: Self-pay | Admitting: Family Medicine

## 2017-07-06 ENCOUNTER — Encounter: Payer: Self-pay | Admitting: Family Medicine

## 2017-07-06 ENCOUNTER — Ambulatory Visit (INDEPENDENT_AMBULATORY_CARE_PROVIDER_SITE_OTHER): Payer: BLUE CROSS/BLUE SHIELD | Admitting: Family Medicine

## 2017-07-06 ENCOUNTER — Telehealth: Payer: Self-pay | Admitting: Family Medicine

## 2017-07-06 VITALS — BP 121/83 | HR 76 | Temp 98.6°F | Resp 16 | Ht 67.0 in | Wt 169.4 lb

## 2017-07-06 DIAGNOSIS — J329 Chronic sinusitis, unspecified: Secondary | ICD-10-CM

## 2017-07-06 DIAGNOSIS — B9689 Other specified bacterial agents as the cause of diseases classified elsewhere: Secondary | ICD-10-CM | POA: Diagnosis not present

## 2017-07-06 MED ORDER — CETIRIZINE HCL 10 MG PO TABS: 10.0000 mg | ORAL_TABLET | Freq: Every day | ORAL | 11 refills | Status: DC

## 2017-07-06 MED ORDER — AMOXICILLIN 875 MG PO TABS: 875.0000 mg | ORAL_TABLET | Freq: Two times a day (BID) | ORAL | 0 refills | Status: DC

## 2017-07-06 MED ORDER — FLUTICASONE FUROATE 27.5 MCG/SPRAY NA SUSP: 2.0000 | Freq: Every day | NASAL | 12 refills | Status: DC

## 2017-07-06 NOTE — Telephone Encounter (Signed)
Please advise? I know that pt requested this particular flonase

## 2017-07-06 NOTE — Patient Instructions (Signed)
Follow up as needed or as scheduled START the Amoxicillin twice daily- take w/ food Drink plenty of fluids Restart Zyrtec daily Use the Flonase daily Call with any questions or concerns Hang in there!!!

## 2017-07-06 NOTE — Telephone Encounter (Signed)
Please clear this w/ pt- he is also able to buy his preferred version OTC

## 2017-07-06 NOTE — Progress Notes (Signed)
   Subjective:    Patient ID: Jon Hipp., male    DOB: 22-May-1960, 57 y.o.   MRN: [id number]  HPI URI- 'low grade' headaches 'all over' requiring him to take 'something' the last few days.  Will wake w/ HAs, they improve over the day and then worsen again at night.  Pt reports intermittent dizziness- occurs in waves.  Using Flonase but not regularly.  Not on Zyrtec.   Review of Systems For ROS see HPI     Objective:   Physical Exam  Constitutional: He appears well-developed and well-nourished. No distress.  HENT:  Head: Normocephalic and atraumatic.  Right Ear: Tympanic membrane normal.  Left Ear: Tympanic membrane normal.  Nose: Mucosal edema and rhinorrhea present. Right sinus exhibits frontal sinus tenderness. Right sinus exhibits no maxillary sinus tenderness. Left sinus exhibits frontal sinus tenderness. Left sinus exhibits no maxillary sinus tenderness.  Mouth/Throat: Mucous membranes are normal. Oropharyngeal exudate and posterior oropharyngeal erythema present. No posterior oropharyngeal edema.  + PND  Eyes: Conjunctivae and EOM are normal. Pupils are equal, round, and reactive to light.  Neck: Normal range of motion. Neck supple.  Cardiovascular: Normal rate, regular rhythm and normal heart sounds.  Pulmonary/Chest: Effort normal and breath sounds normal. No respiratory distress. He has no wheezes.  Lymphadenopathy:    He has no cervical adenopathy.  Skin: Skin is warm and dry.  Vitals reviewed.         Assessment & Plan:  Bacterial sinusitis- new.  Pt's sxs and PE consistent w/ infxn.  Start abx.  Restart Zyrtec and Flonase daily.  Reviewed supportive care and red flags that should prompt return.  Pt expressed understanding and is in agreement w/ plan.

## 2017-07-06 NOTE — Telephone Encounter (Signed)
Copied from [place] [id number]. Topic: Quick Communication - Rx Refill/Question >> Jul 06, 2017  2:39 PM Scherrie Gerlach wrote: Medication: fluticasone (FLONASE SENSIMIST) 27.5 MCG/SPRAY nasal spray  Pharmacy calling to ask if this med can be switched to  fluticasone (FLONASE) 50 MCG/ACT nasal spray  Pharmacy states this is the preferred flonase.  Please resend new Rx if OK. [name] at [place], [place] [phone] (Phone) [phone]-

## 2017-07-06 NOTE — Telephone Encounter (Signed)
Called pt and left a detailed message to advise that he can buy the flonase sensimist OTC or he could call back and advise and regular flonase will be filled to his pharmacy.   Ok for [facility] to Discuss results / PCP recommendations / Schedule patient/ send in prescription

## 2017-07-07 NOTE — Telephone Encounter (Signed)
Pt bought the OTC sensimist.

## 2017-08-02 ENCOUNTER — Other Ambulatory Visit: Payer: Self-pay | Admitting: Family Medicine

## 2017-08-02 ENCOUNTER — Other Ambulatory Visit: Payer: Self-pay | Admitting: General Practice

## 2017-08-02 MED ORDER — RABEPRAZOLE SODIUM 20 MG PO TBEC: 20.0000 mg | DELAYED_RELEASE_TABLET | Freq: Every day | ORAL | 1 refills | Status: DC

## 2017-08-09 ENCOUNTER — Other Ambulatory Visit: Payer: Self-pay

## 2017-08-09 ENCOUNTER — Encounter: Payer: Self-pay | Admitting: Family Medicine

## 2017-08-09 ENCOUNTER — Ambulatory Visit (INDEPENDENT_AMBULATORY_CARE_PROVIDER_SITE_OTHER): Payer: BLUE CROSS/BLUE SHIELD | Admitting: Family Medicine

## 2017-08-09 VITALS — BP 119/82 | HR 71 | Temp 98.0°F | Resp 16 | Ht 67.0 in | Wt 175.5 lb

## 2017-08-09 DIAGNOSIS — E119 Type 2 diabetes mellitus without complications: Secondary | ICD-10-CM

## 2017-08-09 DIAGNOSIS — M79644 Pain in right finger(s): Secondary | ICD-10-CM

## 2017-08-09 DIAGNOSIS — E7849 Other hyperlipidemia: Secondary | ICD-10-CM | POA: Diagnosis not present

## 2017-08-09 LAB — BASIC METABOLIC PANEL
BUN: 14 mg/dL (ref 6–23)
CALCIUM: 9.7 mg/dL (ref 8.4–10.5)
CO2: 30 mEq/L (ref 19–32)
CREATININE: 1.23 mg/dL (ref 0.40–1.50)
Chloride: 100 mEq/L (ref 96–112)
GFR: 64.47 mL/min (ref >60.00)
Glucose, Bld: 96 mg/dL (ref 70–99)
Potassium: 4.5 mEq/L (ref 3.5–5.1)
SODIUM: 137 meq/L (ref 135–145)

## 2017-08-09 LAB — CBC WITH DIFFERENTIAL/PLATELET
BASOS ABS: 0.1 10*3/uL (ref 0.0–0.1)
BASOS PCT: 1.6 % (ref 0.0–3.0)
EOS ABS: 0.1 10*3/uL (ref 0.0–0.7)
Eosinophils Relative: 2 % (ref 0.0–5.0)
HEMATOCRIT: 45.3 % (ref 39.0–52.0)
Hemoglobin: 15.1 g/dL (ref 13.0–17.0)
LYMPHS PCT: 38 % (ref 12.0–46.0)
Lymphs Abs: 2.1 10*3/uL (ref 0.7–4.0)
MCHC: 33.3 g/dL (ref 30.0–36.0)
MCV: 94.5 fl (ref 78.0–100.0)
MONOS PCT: 8.1 % (ref 3.0–12.0)
Monocytes Absolute: 0.4 10*3/uL (ref 0.1–1.0)
NEUTROS ABS: 2.7 10*3/uL (ref 1.4–7.7)
Neutrophils Relative %: 50.3 % (ref 43.0–77.0)
PLATELETS: 406 10*3/uL — AB (ref 150.0–400.0)
RBC: 4.79 Mil/uL (ref 4.22–5.81)
RDW: 14 % (ref 11.5–15.5)
WBC: 5.4 10*3/uL (ref 4.0–10.5)

## 2017-08-09 LAB — HEPATIC FUNCTION PANEL
ALK PHOS: 31 U/L — AB (ref 39–117)
ALT: 14 U/L (ref 0–53)
AST: 16 U/L (ref 0–37)
Albumin: 4.4 g/dL (ref 3.5–5.2)
BILIRUBIN DIRECT: 0.1 mg/dL (ref 0.0–0.3)
BILIRUBIN TOTAL: 0.5 mg/dL (ref 0.2–1.2)
TOTAL PROTEIN: 7 g/dL (ref 6.0–8.3)

## 2017-08-09 LAB — LIPID PANEL
CHOL/HDL RATIO: 3
Cholesterol: 167 mg/dL (ref 0–200)
HDL: 48.5 mg/dL (ref >39.00)
LDL CALC: 96 mg/dL (ref 0–99)
NONHDL: 118.66
Triglycerides: 112 mg/dL (ref 0.0–149.0)
VLDL: 22.4 mg/dL (ref 0.0–40.0)

## 2017-08-09 LAB — HEMOGLOBIN A1C: HEMOGLOBIN A1C: 6.3 % (ref 4.6–6.5)

## 2017-08-09 LAB — TSH: TSH: 3.22 u[IU]/mL (ref 0.35–4.50)

## 2017-08-09 NOTE — Patient Instructions (Signed)
Follow up in 3-4 months to recheck sugar We'll notify you of your lab results and make any changes if needed We'll call you with your Hand Surgery appt Continue to work on healthy diet and regular exercise- you can do it! Call with any questions or concerns Happy Early Birthday!!!

## 2017-08-09 NOTE — Assessment & Plan Note (Signed)
Chronic problem.  On Fenofibrate w/o difficulty.  Again, stressed need for healthy diet and regular exercise.  Check labs.  Adjust meds prn

## 2017-08-09 NOTE — Progress Notes (Signed)
   Subjective:    Patient ID: Jon Hipp., male    DOB: 1961/01/19, 57 y.o.   MRN: [id number]  HPI DM- diet controlled.  UTD on eye exam, foot exam, microalbumin.  Pt has gained 6 lbs since last visit.  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  No CP, SOB, HAs, visual changes, edema, numbness/tingling of hands/feet.  Hyperlipidemia- chronic problem, on Fenofibrate 160mg  daily.  Denies abd pain, N/V, myalgias.  Pt has not been exercising x2 weeks since having a procedure done.  R thumb pain- pt fell back in November, had negative xray but is still having pain and limited range of motion at IP joint.   Review of Systems For ROS see HPI     Objective:   Physical Exam  Constitutional: He is oriented to person, place, and time. He appears well-developed and well-nourished. No distress.  HENT:  Head: Normocephalic and atraumatic.  Eyes: Pupils are equal, round, and reactive to light. Conjunctivae and EOM are normal.  Neck: Normal range of motion. Neck supple. No thyromegaly present.  Cardiovascular: Normal rate, regular rhythm, normal heart sounds and intact distal pulses.  No murmur heard. Pulmonary/Chest: Effort normal and breath sounds normal. No respiratory distress.  Abdominal: Soft. Bowel sounds are normal. He exhibits no distension.  Musculoskeletal: He exhibits tenderness (TTP over R 1st IP joint w/ limited flexion). He exhibits no edema.  Lymphadenopathy:    He has no cervical adenopathy.  Neurological: He is alert and oriented to person, place, and time. No cranial nerve deficit.  Skin: Skin is warm and dry.  Psychiatric: He has a normal mood and affect. His behavior is normal.          Assessment & Plan:  Thumb pain- ongoing issue for pt since fall in November.  Refer to Hand Surgery for complete evaluation and tx.

## 2017-08-09 NOTE — Assessment & Plan Note (Signed)
Ongoing issue for pt.  UTD on foot exam, eye exam, microalbumin.  Stressed need for healthy diet and regular exercise.  Check labs.  Adjust tx plan prn.

## 2017-08-10 ENCOUNTER — Encounter: Payer: Self-pay | Admitting: General Practice

## 2017-09-12 ENCOUNTER — Other Ambulatory Visit: Payer: Self-pay | Admitting: Family Medicine

## 2017-10-18 ENCOUNTER — Other Ambulatory Visit: Payer: Self-pay | Admitting: Family Medicine

## 2017-10-18 DIAGNOSIS — K219 Gastro-esophageal reflux disease without esophagitis: Secondary | ICD-10-CM

## 2017-11-03 ENCOUNTER — Encounter (INDEPENDENT_AMBULATORY_CARE_PROVIDER_SITE_OTHER): Payer: Self-pay

## 2017-11-07 ENCOUNTER — Ambulatory Visit (INDEPENDENT_AMBULATORY_CARE_PROVIDER_SITE_OTHER): Payer: BLUE CROSS/BLUE SHIELD | Admitting: Sports Medicine

## 2017-11-07 VITALS — BP 122/84 | Ht 67.0 in | Wt 173.0 lb

## 2017-11-07 DIAGNOSIS — M722 Plantar fascial fibromatosis: Secondary | ICD-10-CM

## 2017-11-08 ENCOUNTER — Telehealth: Payer: Self-pay | Admitting: General Practice

## 2017-11-08 ENCOUNTER — Encounter: Payer: Self-pay | Admitting: Sports Medicine

## 2017-11-08 ENCOUNTER — Other Ambulatory Visit: Payer: Self-pay | Admitting: Family Medicine

## 2017-11-08 DIAGNOSIS — Z1283 Encounter for screening for malignant neoplasm of skin: Secondary | ICD-10-CM

## 2017-11-08 DIAGNOSIS — R232 Flushing: Secondary | ICD-10-CM

## 2017-11-08 NOTE — Progress Notes (Signed)
   Subjective:    Patient ID: Jon Hipp., male    DOB: [birth year], 57 y.o.   MRN: [id number]  HPI  Chief complaint: Bilateral foot pain  57 year old male comes in today complaining of 2-3 months of bilateral foot pain. No injury that he can recall but a gradual onset of pain that began after he started a walk/run program. He began to notice some "knots" along the plantar aspect of both feet. He stopped running for about a month and the "knots" in the left foot resolved but the ones in the right foot remained. He complains of tenderness diffusely along the plantar aspect of his foot but primarily along the mid portion of the arch. He has not noticed any swelling other than the "knots". No numbness or tingling. He finds that his dress shoes are more comfortable than his running shoes.He denies previous occurrences. No pain at rest.  Past medical history reviewed Medications reviewed Allergies reviewed    Review of Systems   as above Objective:   Physical Exam  Well-developed, well-nourished. No acute distress. Awake alert and oriented 3. Vital signs reviewed  Examination of both feet in the standing position shows a fairly well-preserved longitudinal arch but the patient has definite pronation with walking and running. There is no tenderness to palpation at the calcaneal origin of the plantar fascia but there is a palpable fibroma in the midportion of the right plantar fascial. Smaller fibroma in the midportion of the left plantar fascia as well.No pain with calcaneal squeeze. No soft tissue swelling across the dorsum of the foot. Patient is able to run and walk without a limp. Good pulses.  Brief bedside MSK ultrasound of both plantar fascias shows no plantar fascial thickening at the calcaneal origin. There is a small fibroma in the left plantar fascia and a moderately sized fibroma in the right plantar fascia, both in the midportion.      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 Were going to try an arch strap with activity and he will try some green sports insoles with scaphoid pads to correct his pronation. He found these to be comfortable prior to leaving the office. He will follow-up with me in 4 weeks. If symptoms improve with the assistance of the green sports insoles and scaphoid pads then we will consider custom orthotics at that time. If symptoms persist or worsen then consider referral to podiatry. I think he is okay to continue with all activity, including walking and running for recreation, using pain as his guide.

## 2017-11-08 NOTE — Telephone Encounter (Signed)
Called and spoke with pt. He is due his 6 month skin cancer screening and is having some redness to his face. Referral placed per PCP.   Copied from [place] [phone]. Topic: Referral - Request >> Nov 08, 2017  2:15 PM Antonieta Iba C wrote: Reason for CRM: pt is requesting a referral to [facility] w/ Dr. Denna Haggard.   Please assist further.   >> Nov 08, 2017  2:57 PM Midge Minium, MD wrote: Madaline Brilliant to refer pt but we need more information- such as why.

## 2017-11-10 ENCOUNTER — Ambulatory Visit (INDEPENDENT_AMBULATORY_CARE_PROVIDER_SITE_OTHER): Payer: BLUE CROSS/BLUE SHIELD | Admitting: Family Medicine

## 2017-11-10 ENCOUNTER — Encounter: Payer: Self-pay | Admitting: Family Medicine

## 2017-11-10 ENCOUNTER — Other Ambulatory Visit: Payer: Self-pay

## 2017-11-10 VITALS — BP 123/80 | HR 73 | Temp 98.0°F | Resp 16 | Ht 67.0 in | Wt 174.5 lb

## 2017-11-10 DIAGNOSIS — E119 Type 2 diabetes mellitus without complications: Secondary | ICD-10-CM | POA: Diagnosis not present

## 2017-11-10 LAB — BASIC METABOLIC PANEL
BUN: 22 mg/dL (ref 6–23)
CALCIUM: 10 mg/dL (ref 8.4–10.5)
CO2: 31 mEq/L (ref 19–32)
Chloride: 99 mEq/L (ref 96–112)
Creatinine, Ser: 1.45 mg/dL (ref 0.40–1.50)
GFR: 53.27 mL/min — AB (ref >60.00)
Glucose, Bld: 93 mg/dL (ref 70–99)
Potassium: 4.6 mEq/L (ref 3.5–5.1)
SODIUM: 138 meq/L (ref 135–145)

## 2017-11-10 LAB — HEMOGLOBIN A1C: HEMOGLOBIN A1C: 6.3 % (ref 4.6–6.5)

## 2017-11-10 NOTE — Progress Notes (Signed)
   Subjective:    Patient ID: Jon Hipp., male    DOB: [birth year], 57 y.o.   MRN: [id number]  HPI DM- chronic problem, currently diet controlled.  Last A1C 6.3  UTD on foot exam, microalbumin.  Eye exam scheduled for next month.  Pt's weight is stable.  'i've been kinda lazy' b/c of heat.  No CP, SOB, HAs, visual changes, abd pain, N/V, numbness/tingling of hands/feet.   Review of Systems For ROS see HPI     Objective:   Physical Exam  Constitutional: He is oriented to person, place, and time. He appears well-developed and well-nourished. No distress.  HENT:  Head: Normocephalic and atraumatic.  Eyes: Pupils are equal, round, and reactive to light. Conjunctivae and EOM are normal.  Neck: Normal range of motion. Neck supple. No thyromegaly present.  Cardiovascular: Normal rate, regular rhythm, normal heart sounds and intact distal pulses.  No murmur heard. Pulmonary/Chest: Effort normal and breath sounds normal. No respiratory distress.  Abdominal: Soft. Bowel sounds are normal. He exhibits no distension.  Musculoskeletal: He exhibits no edema.  Lymphadenopathy:    He has no cervical adenopathy.  Neurological: He is alert and oriented to person, place, and time. No cranial nerve deficit.  Skin: Skin is warm and dry.  Psychiatric: He has a normal mood and affect. His behavior is normal.  Vitals reviewed.         Assessment & Plan:

## 2017-11-10 NOTE — Patient Instructions (Signed)
Schedule your complete physical in 3-4 months We'll notify you of your lab results and make any changes if needed Continue to work on healthy diet and regular exercise- you can do it! Call with any questions or concerns Enjoy the rest of your summer!!!

## 2017-11-10 NOTE — Assessment & Plan Note (Signed)
Ongoing issue for pt.  UTD on foot exam, microalbumin, due for eye exam- scheduled for next month.  Stressed need for healthy diet and regular exercise.  Check labs.  Adjust tx plan prn.

## 2017-11-13 ENCOUNTER — Encounter: Payer: Self-pay | Admitting: General Practice

## 2017-11-13 NOTE — Telephone Encounter (Signed)
Patient called and states that he would like a call back in reference to a referral. Patient didn't discuss what the message was

## 2017-12-12 ENCOUNTER — Ambulatory Visit (INDEPENDENT_AMBULATORY_CARE_PROVIDER_SITE_OTHER): Payer: BLUE CROSS/BLUE SHIELD | Admitting: Sports Medicine

## 2017-12-12 ENCOUNTER — Encounter: Payer: Self-pay | Admitting: Sports Medicine

## 2017-12-12 VITALS — BP 120/80 | Ht 67.0 in | Wt 174.0 lb

## 2017-12-12 DIAGNOSIS — M722 Plantar fascial fibromatosis: Secondary | ICD-10-CM

## 2017-12-13 NOTE — Progress Notes (Signed)
   Subjective:    Patient ID: Jon Richardson., male    DOB: 01-02-1961, 57 y.o.   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 His symptoms have not improved. In fact, he is beginning to get some discomfort across the dorsum of his feet. He is not sure whether that is related to the scaphoid pads or the arch straps. The pain in the dorsum of his feet improves at the end of the day when he can remove his shoes. He has been unable to return to running.He does note that the arch strap helps to resolve the fibromas on the left but he still has a rather persistent large fibroma on the right despite compression. He still denies numbness or tingling. He's not noticed any swelling.   Review of Systems As above    Objective:   Physical Exam  Well-developed, well-nourished. No acute distress. Awake alert and oriented 3. Vital signs reviewed  Examination of both feet shows a persistent fibroma in the mid substance of the right plantar fascial.I do not appreciate any fibromas in the left plantar fascia. There is no soft tissue swelling. No swelling across the dorsum of his feet. No significant tenderness to palpation across the dorsum of his feet. Good ankle range of motion. Good pulses. Walking without a limp.      Assessment & Plan:   Plantar fibromatosis Dorsal bilateral foot pain  I believe the dorsal bilateral foot pain is either a consequence of the scaphoid pads or the arch straps. I removed the scaphoid pads from his green insoles and the patient did notice a difference when ambulating with the inserts in his dress shoes. He will try using the insoles without the scaphoid pads and continue with the arch straps for the next couple of days. If he continues to have pain, then he will discontinue the arch straps. I did provide him with a second pair of scaphoid pads to experiment with if he believes the arch straps are the main cause of  his dorsal foot pain. If that is the case, then he can discontinue the arch straps in place the new scaphoid pads on his green insert. He understands that this is a trial by error and I would like to reevaluate him in another 3-4 weeks. In regards to the plantar fibromatosis, I really don't have a good treatment option for him. I recommend no aggressive treatment if his symptoms are tolerable. However, I also discussed the possibility of referral to podiatry if he would like to discuss surgical excision in the future.

## 2017-12-19 ENCOUNTER — Other Ambulatory Visit: Payer: Self-pay | Admitting: Family Medicine

## 2017-12-19 LAB — HM DIABETES EYE EXAM

## 2018-01-09 ENCOUNTER — Ambulatory Visit: Payer: BLUE CROSS/BLUE SHIELD | Admitting: Sports Medicine

## 2018-02-20 ENCOUNTER — Other Ambulatory Visit: Payer: Self-pay | Admitting: Family Medicine

## 2018-02-20 ENCOUNTER — Encounter: Payer: Self-pay | Admitting: Gastroenterology

## 2018-02-20 ENCOUNTER — Ambulatory Visit (INDEPENDENT_AMBULATORY_CARE_PROVIDER_SITE_OTHER): Payer: BLUE CROSS/BLUE SHIELD | Admitting: Gastroenterology

## 2018-02-20 VITALS — BP 138/82 | HR 72 | Ht 67.0 in | Wt 175.1 lb

## 2018-02-20 DIAGNOSIS — R140 Abdominal distension (gaseous): Secondary | ICD-10-CM

## 2018-02-20 DIAGNOSIS — K5900 Constipation, unspecified: Secondary | ICD-10-CM | POA: Diagnosis not present

## 2018-02-20 DIAGNOSIS — K219 Gastro-esophageal reflux disease without esophagitis: Secondary | ICD-10-CM

## 2018-02-20 MED ORDER — FAMOTIDINE 40 MG PO TABS: 40.0000 mg | ORAL_TABLET | Freq: Every day | ORAL | 11 refills | Status: DC

## 2018-02-20 MED ORDER — DICYCLOMINE HCL 10 MG PO CAPS: 10.0000 mg | ORAL_CAPSULE | Freq: Three times a day (TID) | ORAL | 11 refills | Status: DC

## 2018-02-20 NOTE — Progress Notes (Signed)
    History of Present Illness: This is a 57 year old male chest pain related to GERD and anxiety who relates worsening problems with reflux symptoms.  He describes upper abdominal pain and regurgitation particularly at night.  In addition he has frequent postprandial abdominal bloating and gas.  He also notes mild constipation.   EGD 06/2016 - Normal esophagus. - Non-bleeding, mild erosive gastropathy. Biopsied. - Normal duodenal bulb and second portion of the duodenum.  Current Medications, Allergies, Past Medical History, Past Surgical History, Family History and Social History were reviewed in [employer] record.  Physical Exam: General: Well developed, well nourished, no acute distress Head: Normocephalic and atraumatic Eyes:  sclerae anicteric, EOMI Ears: Normal auditory acuity Mouth: No deformity or lesions Lungs: Clear throughout to auscultation Heart: Regular rate and rhythm; no murmurs, rubs or bruits Abdomen: Soft, non tender and non distended. No masses, hepatosplenomegaly or hernias noted. Normal Bowel sounds Rectal: Not done Musculoskeletal: Symmetrical with no gross deformities  Pulses:  Normal pulses noted Extremities: No clubbing, cyanosis, edema or deformities noted Neurological: Alert oriented x 4, grossly nonfocal Psychological:  Alert and cooperative. Normal mood and affect   Assessment and Recommendations:  1. GERD. Elevated HOB long term. Closely follow antireflux measures long term. Add famotidine 40 mg po hs. Continue pantoprazole 40 mg po qam. Consider pantoprazole 40 mg twice daily if symptoms do not respond adequately.  REV 6 weeks.   2. Constipation, abdominal bloating, gas. Low gas diet. Gas-X qid prn, Dicyclomine 10 mg po tid ac. REV 6 weeks.   I spent 15 minutes of face-to-face time with the patient. Greater than 50% of the time was spent counseling and coordinating care.

## 2018-02-20 NOTE — Patient Instructions (Signed)
We have sent the following medications to your pharmacy for you to pick up at your convenience: famotidine and dicyclomine.   You can take over the counter Gas- x four times a day for gas and bloating.   Patient advised to avoid spicy, acidic, citrus, chocolate, mints, fruit and fruit juices.  Limit the intake of caffeine, alcohol and Soda.  Don't exercise too soon after eating.  Don't lie down within 3-4 hours of eating.  Elevate the head of your bed.   You have been given a gas prevention diet.   Start Benefiber or Citracel clear over the counter daily.   Thank you for choosing me and [facility].  Pricilla Riffle. Dagoberto Ligas., MD., Marval Regal

## 2018-03-21 ENCOUNTER — Encounter: Payer: Self-pay | Admitting: Family Medicine

## 2018-03-21 ENCOUNTER — Ambulatory Visit (INDEPENDENT_AMBULATORY_CARE_PROVIDER_SITE_OTHER): Payer: BLUE CROSS/BLUE SHIELD | Admitting: Family Medicine

## 2018-03-21 ENCOUNTER — Other Ambulatory Visit: Payer: Self-pay

## 2018-03-21 VITALS — BP 129/82 | HR 62 | Temp 98.2°F | Resp 16 | Ht 67.0 in | Wt 172.1 lb

## 2018-03-21 DIAGNOSIS — E119 Type 2 diabetes mellitus without complications: Secondary | ICD-10-CM

## 2018-03-21 DIAGNOSIS — Z0000 Encounter for general adult medical examination without abnormal findings: Secondary | ICD-10-CM | POA: Diagnosis not present

## 2018-03-21 DIAGNOSIS — Z23 Encounter for immunization: Secondary | ICD-10-CM

## 2018-03-21 NOTE — Patient Instructions (Signed)
Follow up in 3-4 months to recheck sugar We'll notify you of your lab results and make any changes if needed Keep up the good work on healthy diet and regular exercise- you look great! Call with any questions or concerns Happy Holidays!!!

## 2018-03-21 NOTE — Assessment & Plan Note (Signed)
Pt's PE WNL.  UTD on colonoscopy, urology.  Flu shot given today.  Check labs.  Anticipatory guidance provided.

## 2018-03-21 NOTE — Assessment & Plan Note (Signed)
Chronic problem.  UTD on eye exam, foot exam done today.  Will get microalbumin.  Applauded healthy diet and regular exercise.  Check labs.  Adjust tx prn

## 2018-03-21 NOTE — Progress Notes (Signed)
   Subjective:    Patient ID: Jon Hipp., male    DOB: 1961/02/06, 57 y.o.   MRN: [id number]  HPI CPE- UTD on colonoscopy, eye exam, urology.  Due for flu shot.  Exercising regularly.   Review of Systems Patient reports no vision/hearing changes, anorexia, fever ,adenopathy, persistant/recurrent hoarseness, swallowing issues, chest pain, palpitations, edema, persistant/recurrent cough, hemoptysis, dyspnea (rest,exertional, paroxysmal nocturnal), gastrointestinal  bleeding (melena, rectal bleeding), abdominal pain, excessive heart burn, GU symptoms (dysuria, hematuria, voiding/incontinence issues) syncope, focal weakness, memory loss, numbness & tingling, skin/hair/nail changes, depression, anxiety, abnormal bruising/bleeding, musculoskeletal symptoms/signs.     Objective:   Physical Exam General Appearance:    Alert, cooperative, no distress, appears stated age  Head:    Normocephalic, without obvious abnormality, atraumatic  Eyes:    PERRL, conjunctiva/corneas clear, EOM's intact, fundi    benign, both eyes       Ears:    Normal TM's and external ear canals, both ears  Nose:   Nares normal, septum midline, mucosa normal, no drainage   or sinus tenderness  Throat:   Lips, mucosa, and tongue normal; teeth and gums normal  Neck:   Supple, symmetrical, trachea midline, no adenopathy;       thyroid:  No enlargement/tenderness/nodules  Back:     Symmetric, no curvature, ROM normal, no CVA tenderness  Lungs:     Clear to auscultation bilaterally, respirations unlabored  Chest wall:    No tenderness or deformity  Heart:    Regular rate and rhythm, S1 and S2 normal, no murmur, rub   or gallop  Abdomen:     Soft, non-tender, bowel sounds active all four quadrants,    no masses, no organomegaly  Genitalia:    Deferred to urology  Rectal:    Extremities:   Extremities normal, atraumatic, no cyanosis or edema  Pulses:   2+ and symmetric all extremities  Skin:   Skin color,  texture, turgor normal, no rashes or lesions  Lymph nodes:   Cervical, supraclavicular, and axillary nodes normal  Neurologic:   CNII-XII intact. Normal strength, sensation and reflexes      throughout          Assessment & Plan:

## 2018-03-22 LAB — TSH: TSH: 4.11 mIU/L (ref 0.40–4.50)

## 2018-03-22 LAB — LIPID PANEL
CHOLESTEROL: 168 mg/dL (ref <200)
HDL: 59 mg/dL (ref >40)
LDL CHOLESTEROL (CALC): 92 mg/dL
Non-HDL Cholesterol (Calc): 109 mg/dL (calc) (ref <130)
Total CHOL/HDL Ratio: 2.8 (calc) (ref <5.0)
Triglycerides: 77 mg/dL (ref <150)

## 2018-03-22 LAB — HEPATIC FUNCTION PANEL
AG RATIO: 2.2 (calc) (ref 1.0–2.5)
ALT: 24 U/L (ref 9–46)
AST: 27 U/L (ref 10–35)
Albumin: 4.9 g/dL (ref 3.6–5.1)
Alkaline phosphatase (APISO): 34 U/L — ABNORMAL LOW (ref 40–115)
BILIRUBIN DIRECT: 0.1 mg/dL (ref 0.0–0.2)
GLOBULIN: 2.2 g/dL (ref 1.9–3.7)
Indirect Bilirubin: 0.4 mg/dL (calc) (ref 0.2–1.2)
Total Bilirubin: 0.5 mg/dL (ref 0.2–1.2)
Total Protein: 7.1 g/dL (ref 6.1–8.1)

## 2018-03-22 LAB — CBC WITH DIFFERENTIAL/PLATELET
BASOS ABS: 104 {cells}/uL (ref 0–200)
BASOS PCT: 1.4 %
Eosinophils Absolute: 118 cells/uL (ref 15–500)
Eosinophils Relative: 1.6 %
HEMATOCRIT: 47.7 % (ref 38.5–50.0)
HEMOGLOBIN: 16.4 g/dL (ref 13.2–17.1)
LYMPHS ABS: 3004 {cells}/uL (ref 850–3900)
MCH: 31.1 pg (ref 27.0–33.0)
MCHC: 34.4 g/dL (ref 32.0–36.0)
MCV: 90.3 fL (ref 80.0–100.0)
MPV: 10.4 fL (ref 7.5–12.5)
Monocytes Relative: 8.4 %
NEUTROS ABS: 3552 {cells}/uL (ref 1500–7800)
Neutrophils Relative %: 48 %
Platelets: 435 10*3/uL — ABNORMAL HIGH (ref 140–400)
RBC: 5.28 10*6/uL (ref 4.20–5.80)
RDW: 13 % (ref 11.0–15.0)
Total Lymphocyte: 40.6 %
WBC: 7.4 10*3/uL (ref 3.8–10.8)
WBCMIX: 622 {cells}/uL (ref 200–950)

## 2018-03-22 LAB — BASIC METABOLIC PANEL
BUN / CREAT RATIO: 20 (calc) (ref 6–22)
BUN: 27 mg/dL — AB (ref 7–25)
CALCIUM: 10.1 mg/dL (ref 8.6–10.3)
CO2: 28 mmol/L (ref 20–32)
CREATININE: 1.37 mg/dL — AB (ref 0.70–1.33)
Chloride: 98 mmol/L (ref 98–110)
GLUCOSE: 92 mg/dL (ref 65–99)
POTASSIUM: 4.7 mmol/L (ref 3.5–5.3)
Sodium: 138 mmol/L (ref 135–146)

## 2018-03-22 LAB — MICROALBUMIN / CREATININE URINE RATIO
Creatinine, Urine: 34 mg/dL (ref 20–320)
Microalb, Ur: <0.2 mg/dL

## 2018-03-22 LAB — HEMOGLOBIN A1C
EAG (MMOL/L): 7 (calc)
Hgb A1c MFr Bld: 6 % of total Hgb — ABNORMAL HIGH (ref <5.7)
MEAN PLASMA GLUCOSE: 126 (calc)

## 2018-03-30 ENCOUNTER — Other Ambulatory Visit: Payer: Self-pay | Admitting: Family Medicine

## 2018-04-06 ENCOUNTER — Ambulatory Visit (INDEPENDENT_AMBULATORY_CARE_PROVIDER_SITE_OTHER): Payer: BLUE CROSS/BLUE SHIELD | Admitting: Physician Assistant

## 2018-04-06 ENCOUNTER — Ambulatory Visit: Payer: Self-pay | Admitting: *Deleted

## 2018-04-06 ENCOUNTER — Encounter: Payer: Self-pay | Admitting: Physician Assistant

## 2018-04-06 ENCOUNTER — Other Ambulatory Visit: Payer: Self-pay

## 2018-04-06 VITALS — BP 120/80 | HR 114 | Temp 98.4°F | Resp 16 | Ht 67.0 in | Wt 170.0 lb

## 2018-04-06 DIAGNOSIS — R42 Dizziness and giddiness: Secondary | ICD-10-CM

## 2018-04-06 LAB — CBC WITH DIFFERENTIAL/PLATELET
BASOS PCT: 0.9 % (ref 0.0–3.0)
Basophils Absolute: 0.1 10*3/uL (ref 0.0–0.1)
EOS ABS: 0.1 10*3/uL (ref 0.0–0.7)
Eosinophils Relative: 0.7 % (ref 0.0–5.0)
HEMATOCRIT: 49.7 % (ref 39.0–52.0)
Hemoglobin: 16.8 g/dL (ref 13.0–17.0)
Lymphocytes Relative: 23.4 % (ref 12.0–46.0)
Lymphs Abs: 1.8 10*3/uL (ref 0.7–4.0)
MCHC: 33.7 g/dL (ref 30.0–36.0)
MCV: 94.7 fl (ref 78.0–100.0)
MONO ABS: 0.5 10*3/uL (ref 0.1–1.0)
Monocytes Relative: 6.9 % (ref 3.0–12.0)
NEUTROS ABS: 5.3 10*3/uL (ref 1.4–7.7)
Neutrophils Relative %: 68.1 % (ref 43.0–77.0)
PLATELETS: 381 10*3/uL (ref 150.0–400.0)
RBC: 5.25 Mil/uL (ref 4.22–5.81)
RDW: 13.6 % (ref 11.5–15.5)
WBC: 7.8 10*3/uL (ref 4.0–10.5)

## 2018-04-06 LAB — BASIC METABOLIC PANEL
BUN: 26 mg/dL — AB (ref 6–23)
CALCIUM: 10.1 mg/dL (ref 8.4–10.5)
CO2: 28 mEq/L (ref 19–32)
Chloride: 100 mEq/L (ref 96–112)
Creatinine, Ser: 1.24 mg/dL (ref 0.40–1.50)
GFR: 63.72 mL/min (ref >60.00)
Glucose, Bld: 97 mg/dL (ref 70–99)
POTASSIUM: 4.5 meq/L (ref 3.5–5.1)
Sodium: 138 mEq/L (ref 135–145)

## 2018-04-06 LAB — POCT CBG (FASTING - GLUCOSE)-MANUAL ENTRY: Glucose Fasting, POC: 137 mg/dL — AB (ref 70–99)

## 2018-04-06 NOTE — Telephone Encounter (Signed)
Pt refused to be triaged. Requested to have an appointment today. Appointment scheduled.

## 2018-04-06 NOTE — Progress Notes (Signed)
Patient presents to clinic today c/o 2 episodes of lightheadedness and feeling off-balance in the morning when getting out of bed. Denies chest pain, palpitations, SOB, dizziness or syncope. Keeps very well-hydrated but notes following a very low carb diet currently (mostly keto). Started this about 1.5 weeks ago. No symptoms presently.    Past Medical History:  Diagnosis Date  . Anxiety   . BCC (basal cell carcinoma of skin) 03-2013   scalp  . BPH (benign prostatic hyperplasia)   . Cervical pain (neck)   . Diverticulosis   . Fatty liver   . GERD   . Headache   . HERPES SIMPLEX, UNCOMPLICATED   . History of exercise stress test    ETT 3/18: Ex 10'02", normal BP response, no ST changes; neg adequate ETT  . Irritable bowel syndrome (IBS)    WITH DIARRHEA  . LUMBAR RADICULOPATHY, LEFT   . 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 Delice Lesch  . Other and unspecified hyperlipidemia   . Other chronic sinusitis   . Other testicular hypofunction   . PANIC ATTACK, ACUTE     Current Outpatient Medications on File Prior to Visit  Medication Sig Dispense Refill  . Alpha-D-Galactosidase (BEANO) TABS Take by mouth as needed.    . ALPRAZolam (XANAX) 1 MG tablet TAKE 1/2 TO 1 BY MOUTH ONCE DAILY AS NEEDED 30 tablet 2  . aspirin 81 MG tablet Take 81 mg by mouth daily.    Marland Kitchen dicyclomine (BENTYL) 10 MG capsule Take 1 capsule (10 mg total) by mouth 3 (three) times daily before meals. 90 capsule 11  . famotidine (PEPCID) 40 MG tablet Take 1 tablet (40 mg total) by mouth at bedtime. 30 tablet 11  . fenofibrate 160 MG tablet TAKE ONE TABLET BY MOUTH DAILY 30 tablet 4  . fluticasone (FLONASE) 50 MCG/ACT nasal spray Place 2 sprays into both nostrils daily. 16 g 6  . Lecithin 1200 MG CAPS Take by mouth daily.    . Maca 500 MG CAPS Take by mouth daily.    . magnesium 30 MG tablet Take 30 mg by mouth daily.    . Needle, Disp, (HYPODERMIC NEEDLE 23GX1") 23G X 1" MISC Please use one needle  each time testosterone is given. 25 each 1  . NEEDLE, DISP, 18 G 18G X 1" MISC Use one needle each time testosterone is drawn for injection. 25 each 12  . OVER THE COUNTER MEDICATION [employer] triple action joint    . pantoprazole (PROTONIX) 40 MG tablet TAKE ONE TABLET BY MOUTH DAILY 30 tablet 5  . RABEprazole (ACIPHEX) 20 MG tablet TAKE ONE TABLET BY MOUTH TWO TIMES A DAY 30 tablet 6  . SUPER B COMPLEX/C PO Take by mouth daily.    . Syringe, Disposable, 3 ML MISC Please use one syringe each time testosterone injection is given. 25 each 1  . testosterone cypionate (DEPOTESTOSTERONE CYPIONATE) 200 MG/ML injection Inject 1 mL (200 mg total) into the muscle every 14 (fourteen) days. 10 mL 3  . valACYclovir (VALTREX) 500 MG tablet TAKE ONE TABLET BY MOUTH DAILY 30 tablet 6  . Zinc 50 MG CAPS Take by mouth.     No current facility-administered medications on file prior to visit.     Allergies  Allergen Reactions  . Erythromycin Diarrhea    abd cramps    Family History  Problem Relation Age of Onset  . Heart disease Mother   . Heart attack Mother   .  Prostate cancer Father   . Lung cancer Father   . Colon cancer Paternal Grandmother   . Heart attack Paternal Grandfather   . Heart disease Maternal Grandmother   . Prostate cancer Paternal Uncle   . Cancer Maternal Uncle        prostate cancer  . Esophageal cancer Neg Hx   . Rectal cancer Neg Hx   . Stomach cancer Neg Hx     Social History   Socioeconomic History  . Marital status: Single    Spouse name: Not on file  . Number of children: 0  . Years of education: HS  . Highest education level: Not on file  Occupational History  . Occupation: Economist   Social Needs  . Financial resource strain: Not on file  . Food insecurity:    Worry: Not on file    Inability: Not on file  . Transportation needs:    Medical: Not on file    Non-medical: Not on file  Tobacco Use  . Smoking status: Never Smoker  . Smokeless  tobacco: Never Used  Substance and Sexual Activity  . Alcohol use: Yes    Alcohol/week: 0.0 standard drinks    Comment: Approximately once a month  . Drug use: No  . Sexual activity: Yes  Lifestyle  . Physical activity:    Days per week: Not on file    Minutes per session: Not on file  . Stress: Not on file  Relationships  . Social connections:    Talks on phone: Not on file    Gets together: Not on file    Attends religious service: Not on file    Active member of club or organization: Not on file    Attends meetings of clubs or organizations: Not on file    Relationship status: Not on file  Other Topics Concern  . Not on file  Social History Narrative   Lives w/ partner Arville Go.   Right-handed.   2 cups coffee daily.   Review of Systems - See HPI.  All other ROS are negative.  BP 120/80   Pulse (!) 114   Temp 98.4 F (36.9 C) (Oral)   Resp 16   Ht 5\' 7"  (1.702 m)   Wt 170 lb (77.1 kg)   SpO2 99%   BMI 26.63 kg/m   Physical Exam Vitals signs reviewed.  HENT:     Head: Normocephalic and atraumatic.     Right Ear: Tympanic membrane normal.     Left Ear: Tympanic membrane normal.     Nose: Nose normal.     Mouth/Throat:     Mouth: Mucous membranes are moist.  Eyes:     Conjunctiva/sclera: Conjunctivae normal.     Pupils: Pupils are equal, round, and reactive to light.  Neck:     Musculoskeletal: Neck supple.  Cardiovascular:     Rate and Rhythm: Regular rhythm. Tachycardia present.     Pulses: Normal pulses.  Pulmonary:     Effort: Pulmonary effort is normal.     Breath sounds: Normal breath sounds.  Lymphadenopathy:     Cervical: No cervical adenopathy.  Neurological:     General: No focal deficit present.     Mental Status: He is alert and oriented to person, place, and time.  Psychiatric:        Mood and Affect: Mood normal.     Recent Results (from the past 2160 hour(s))  Lipid panel     Status: None  Collection Time: 03/21/18  3:41 PM    Result Value Ref Range   Cholesterol 168 <200 mg/dL   HDL 59 >40 mg/dL   Triglycerides 77 <150 mg/dL   LDL Cholesterol (Calc) 92 mg/dL (calc)    Comment: Reference range: <100 . Desirable range <100 mg/dL for primary prevention;   <70 mg/dL for patients with CHD or diabetic patients  with > or = 2 CHD risk factors. Marland Kitchen LDL-C is now calculated using the Bralon Antkowiak-Hopkins  calculation, which is a validated novel method providing  better accuracy than the Friedewald equation in the  estimation of LDL-C.  Cresenciano Genre et al. Annamaria Helling. 8502;774(12): 2061-2068  (http://education.[facility].com/faq/FAQ164)    Total CHOL/HDL Ratio 2.8 <5.0 (calc)   Non-HDL Cholesterol (Calc) 109 <130 mg/dL (calc)    Comment: For patients with diabetes plus 1 major ASCVD risk  factor, treating to a non-HDL-C goal of <100 mg/dL  (LDL-C of <70 mg/dL) is considered a therapeutic  option.   Basic metabolic panel     Status: Abnormal   Collection Time: 03/21/18  3:41 PM  Result Value Ref Range   Glucose, Bld 92 65 - 99 mg/dL    Comment: .            Fasting reference interval .    BUN 27 (H) 7 - 25 mg/dL   Creat 1.37 (H) 0.70 - 1.33 mg/dL    Comment: For patients >70 years of age, the reference limit for Creatinine is approximately 13% higher for people identified as African-American. .    BUN/Creatinine Ratio 20 6 - 22 (calc)   Sodium 138 135 - 146 mmol/L   Potassium 4.7 3.5 - 5.3 mmol/L   Chloride 98 98 - 110 mmol/L   CO2 28 20 - 32 mmol/L   Calcium 10.1 8.6 - 10.3 mg/dL  TSH     Status: None   Collection Time: 03/21/18  3:41 PM  Result Value Ref Range   TSH 4.11 0.40 - 4.50 mIU/L  Hepatic function panel     Status: Abnormal   Collection Time: 03/21/18  3:41 PM  Result Value Ref Range   Total Protein 7.1 6.1 - 8.1 g/dL   Albumin 4.9 3.6 - 5.1 g/dL   Globulin 2.2 1.9 - 3.7 g/dL (calc)   AG Ratio 2.2 1.0 - 2.5 (calc)   Total Bilirubin 0.5 0.2 - 1.2 mg/dL   Bilirubin, Direct 0.1 0.0 - 0.2 mg/dL    Indirect Bilirubin 0.4 0.2 - 1.2 mg/dL (calc)   Alkaline phosphatase (APISO) 34 (L) 40 - 115 U/L   AST 27 10 - 35 U/L   ALT 24 9 - 46 U/L  CBC with Differential/Platelet     Status: Abnormal   Collection Time: 03/21/18  3:41 PM  Result Value Ref Range   WBC 7.4 3.8 - 10.8 Thousand/uL   RBC 5.28 4.20 - 5.80 Million/uL   Hemoglobin 16.4 13.2 - 17.1 g/dL   HCT 47.7 38.5 - 50.0 %   MCV 90.3 80.0 - 100.0 fL   MCH 31.1 27.0 - 33.0 pg   MCHC 34.4 32.0 - 36.0 g/dL   RDW 13.0 11.0 - 15.0 %   Platelets 435 (H) 140 - 400 Thousand/uL   MPV 10.4 7.5 - 12.5 fL   Neutro Abs 3,552 1,500 - 7,800 cells/uL   Lymphs Abs 3,004 850 - 3,900 cells/uL   WBC mixed population 622 200 - 950 cells/uL   Eosinophils Absolute 118 15 - 500 cells/uL   Basophils  Absolute 104 0 - 200 cells/uL   Neutrophils Relative % 48 %   Total Lymphocyte 40.6 %   Monocytes Relative 8.4 %   Eosinophils Relative 1.6 %   Basophils Relative 1.4 %  Hemoglobin A1c     Status: Abnormal   Collection Time: 03/21/18  3:41 PM  Result Value Ref Range   Hgb A1c MFr Bld 6.0 (H) <5.7 % of total Hgb    Comment: For someone without known diabetes, a hemoglobin  A1c value between 5.7% and 6.4% is consistent with prediabetes and should be confirmed with a  follow-up test. . For someone with known diabetes, a value <7% indicates that their diabetes is well controlled. A1c targets should be individualized based on duration of diabetes, age, comorbid conditions, and other considerations. . This assay result is consistent with an increased risk of diabetes. . Currently, no consensus exists regarding use of hemoglobin A1c for diagnosis of diabetes for children. .    Mean Plasma Glucose 126 (calc)   eAG (mmol/L) 7.0 (calc)  Urine Microalbumin w/creat. ratio     Status: None   Collection Time: 03/21/18  3:41 PM  Result Value Ref Range   Creatinine, Urine 34 20 - 320 mg/dL   Microalb, Ur <0.2 mg/dL    Comment: Reference Range Not  established    Microalb Creat Ratio NOTE <30 mcg/mg creat    Comment: NOTE: The urine albumin value is less than  0.2 mg/dL therefore we are unable to calculate  excretion and/or creatinine ratio. . The ADA defines abnormalities in albumin excretion as follows: Marland Kitchen Category         Result (mcg/mg creatinine) . Normal                    <30 Microalbuminuria         30-299  Clinical albuminuria   > OR = 300 . The ADA recommends that at least two of three specimens collected within a 3-6 month period be abnormal before considering a patient to be within a diagnostic category.    Assessment/Plan: 1. Episodic lightheadedness CBG within normal limits (non-fasting). Exam unremarkable. OVS +. No hypotension but 30 mm HG drop with lying to standing. Supportive measures reviewed. Recommend slow rise from bed. Labs today to further assess any other contributing factors. Follow-up if not improving.   - POCT CBG (Fasting - Glucose)    Leeanne Rio, PA-C

## 2018-04-06 NOTE — Telephone Encounter (Signed)
Flow at [facility] @  [place] notified.

## 2018-04-06 NOTE — Patient Instructions (Signed)
Please go to the lab today for blood work.  I will call you with your results. We will alter treatment regimen(s) if indicated by your results.   Please keep well-hydrated.  Continue the electrolyte water.  Minimize carbohydratess but ok to have a small amount here and there.  Get up slowly from bed.

## 2018-04-06 NOTE — Telephone Encounter (Signed)
Jon Richardson, [facility] is aware. Patient has appointment today with PA-C

## 2018-04-09 ENCOUNTER — Encounter: Payer: Self-pay | Admitting: Gastroenterology

## 2018-04-09 ENCOUNTER — Ambulatory Visit (INDEPENDENT_AMBULATORY_CARE_PROVIDER_SITE_OTHER): Payer: BLUE CROSS/BLUE SHIELD | Admitting: Gastroenterology

## 2018-04-09 VITALS — BP 114/70 | HR 74 | Ht 67.0 in | Wt 171.0 lb

## 2018-04-09 DIAGNOSIS — R140 Abdominal distension (gaseous): Secondary | ICD-10-CM | POA: Diagnosis not present

## 2018-04-09 DIAGNOSIS — K219 Gastro-esophageal reflux disease without esophagitis: Secondary | ICD-10-CM | POA: Diagnosis not present

## 2018-04-09 NOTE — Progress Notes (Signed)
    History of Present Illness: This is a 57 year old male with GERD, abdominal pain, gas and bloating.  Symptoms improved on current regimen and a carb modified diet.   Current Medications, Allergies, Past Medical History, Past Surgical History, Family History and Social History were reviewed in [employer] record.  Physical Exam: General: Well developed, well nourished, no acute distress Head: Normocephalic and atraumatic Eyes:  sclerae anicteric, EOMI Ears: Normal auditory acuity Extremities: No clubbing, cyanosis, edema or deformities noted Neurological: Alert oriented x 4, grossly nonfocal Psychological:  Alert and cooperative. Normal mood and affect   Assessment and Recommendations:  1.  GERD.  Continue antireflux measures, pantoprazole 40 mg every morning and famotidine 40 mg at bedtime. REV in 1 year.   2. Abdominal bloating and gas. Substantially improved on a carb modified diet which he will continue. Gas-X 4 times daily as needed and dicyclomine 3 times daily AC prn.

## 2018-04-09 NOTE — Patient Instructions (Signed)
Decrease your dicyclomine to as needed.   Thank you for choosing me and [facility].  Pricilla Riffle. Dagoberto Ligas., MD., Marval Regal

## 2018-04-19 ENCOUNTER — Encounter: Payer: Self-pay | Admitting: Family Medicine

## 2018-04-26 ENCOUNTER — Ambulatory Visit: Payer: BLUE CROSS/BLUE SHIELD | Admitting: Gastroenterology

## 2018-04-27 ENCOUNTER — Ambulatory Visit: Payer: Managed Care, Other (non HMO) | Admitting: Family Medicine

## 2018-04-27 ENCOUNTER — Encounter: Payer: Self-pay | Admitting: Family Medicine

## 2018-04-27 ENCOUNTER — Other Ambulatory Visit: Payer: Self-pay

## 2018-04-27 VITALS — BP 112/80 | HR 86 | Temp 98.5°F | Resp 17 | Ht 67.0 in | Wt 170.1 lb

## 2018-04-27 DIAGNOSIS — R42 Dizziness and giddiness: Secondary | ICD-10-CM

## 2018-04-27 NOTE — Patient Instructions (Signed)
Follow up as needed or as scheduled Make sure you are eating before you work out, after you work out and throughout the day Protein is important but you do need some carbs in your life! Increase your water intake Your symptoms are consistent w/ your blood sugar dropping- this will improve with regular eating and likely a snack before bed Call with any questions or concerns Happy New Year!

## 2018-04-27 NOTE — Progress Notes (Signed)
   Subjective:    Patient ID: Jon Richardson., male    DOB: 1960/06/04, 58 y.o.   MRN: [id number]  HPI Lightheaded- pt saw Einar Pheasant on 12/13 for this issue.  Pt reports sxs are the worst first thing in the AM.  Was waking up sweating.  sxs started after going Keto- cut out all sugar, dramatically cut carbs.  Pt is working out heavily and reports that he likely is not eating enough calories.  Unsure if he is drinking enough water.  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   Review of Systems For ROS see HPI     Objective:   Physical Exam Vitals signs reviewed.  Constitutional:      General: He is not in acute distress.    Appearance: He is well-developed.  HENT:     Head: Normocephalic and atraumatic.  Eyes:     Conjunctiva/sclera: Conjunctivae normal.     Pupils: Pupils are equal, round, and reactive to light.  Neck:     Musculoskeletal: Normal range of motion and neck supple.     Thyroid: No thyromegaly.  Cardiovascular:     Rate and Rhythm: Normal rate and regular rhythm.     Heart sounds: Normal heart sounds. No murmur.  Pulmonary:     Effort: Pulmonary effort is normal. No respiratory distress.     Breath sounds: Normal breath sounds.  Abdominal:     General: Bowel sounds are normal. There is no distension.     Palpations: Abdomen is soft.  Lymphadenopathy:     Cervical: No cervical adenopathy.  Skin:    General: Skin is warm and dry.  Neurological:     Mental Status: He is alert and oriented to person, place, and time.     Cranial Nerves: No cranial nerve deficit.  Psychiatric:        Behavior: Behavior normal.           Assessment & Plan:  Lightheaded- new to provider, ongoing for pt.  Reviewed Cody's recent note and reviewed labs done at CPE, visit in Dec, and at urology on 12/31.  Suspect that his sxs are due to hypoglycemia from not eating enough calories and eliminating all carbs.  Discussed at length the need to eat  before AM workouts, eat afterwards, eat lunch, late afternoon snack, dinner, and possibly snack before bed.  Increase water intake.  Total time spent w/ pt, >30 minutes and more than 50% spent counseling on diet, caloric intake, exercise plans, and awareness of hypoglycemia

## 2018-05-14 ENCOUNTER — Other Ambulatory Visit: Payer: Self-pay | Admitting: General Practice

## 2018-05-14 MED ORDER — RABEPRAZOLE SODIUM 20 MG PO TBEC: 20.0000 mg | DELAYED_RELEASE_TABLET | Freq: Two times a day (BID) | ORAL | 6 refills | Status: DC

## 2018-06-01 ENCOUNTER — Other Ambulatory Visit: Payer: Self-pay | Admitting: Family Medicine

## 2018-06-01 MED ORDER — FENOFIBRATE 160 MG PO TABS: 160.0000 mg | ORAL_TABLET | Freq: Every day | ORAL | 3 refills | Status: DC

## 2018-06-01 NOTE — Telephone Encounter (Signed)
Copied from [place] [id number]. Topic: Quick Communication - Rx Refill/Question >> Jun 01, 2018  3:17 PM [name], [place] C wrote: Medication: fenofibrate 160 MG tablet - pt is out of his medication   Has the patient contacted their pharmacy? Yes   (Agent: If no, request that the patient contact the pharmacy for the refill.) (Agent: If yes, when and what did the pharmacy advise?)  Preferred Pharmacy (with phone number or street name): [name] at [place], [place] - [place] [phone] (Phone) [phone] (Fax)    Agent: Please be advised that RX refills may take up to 3 business days. We ask that you follow-up with your pharmacy.

## 2018-06-18 ENCOUNTER — Encounter: Payer: Self-pay | Admitting: Family Medicine

## 2018-06-19 ENCOUNTER — Ambulatory Visit: Payer: Managed Care, Other (non HMO) | Admitting: Family Medicine

## 2018-06-19 ENCOUNTER — Other Ambulatory Visit: Payer: Self-pay

## 2018-06-19 ENCOUNTER — Encounter: Payer: Self-pay | Admitting: Family Medicine

## 2018-06-19 VITALS — BP 121/81 | HR 89 | Temp 98.0°F | Resp 17 | Ht 67.0 in | Wt 172.1 lb

## 2018-06-19 DIAGNOSIS — M26621 Arthralgia of right temporomandibular joint: Secondary | ICD-10-CM

## 2018-06-19 DIAGNOSIS — J069 Acute upper respiratory infection, unspecified: Secondary | ICD-10-CM

## 2018-06-19 NOTE — Progress Notes (Signed)
   Subjective:    Patient ID: Jon Hipp., male    DOB: 1960/11/06, 58 y.o.   MRN: [id number]  HPI R ear pain- pt reports he developed 'a knot' under R side of jaw last week.  Then noticed discomfort w/ eating.  Pain is radiating up into jaw and R ear.  No fevers.  No drainage from ear.  No dizziness.  + HA.  Denies sinus pain/pressure.  + nasal congestion.  + sick contacts.  Using Flonase.  Not on daily antihistamine.     Review of Systems For ROS see HPI     Objective:   Physical Exam Vitals signs reviewed.  Constitutional:      General: He is not in acute distress.    Appearance: Normal appearance. He is well-developed. He is not ill-appearing.  HENT:     Head: Normocephalic and atraumatic.     Left Ear: Tympanic membrane normal.     Ears:     Comments: Clear middle ear effusion on R    Nose: Congestion (on R side only) and rhinorrhea present.     Mouth/Throat:     Mouth: Mucous membranes are moist.     Pharynx: No oropharyngeal exudate or posterior oropharyngeal erythema.     Comments: TTP over R TMJ Eyes:     Conjunctiva/sclera: Conjunctivae normal.     Pupils: Pupils are equal, round, and reactive to light.  Neck:     Musculoskeletal: Normal range of motion and neck supple.  Cardiovascular:     Rate and Rhythm: Normal rate and regular rhythm.     Heart sounds: Normal heart sounds.  Pulmonary:     Effort: Pulmonary effort is normal. No respiratory distress.     Breath sounds: Normal breath sounds. No wheezing.  Lymphadenopathy:     Head:     Right side of head: Submandibular adenopathy present. No submental, tonsillar, preauricular, posterior auricular or occipital adenopathy.     Left side of head: No submental, submandibular, tonsillar, preauricular, posterior auricular or occipital adenopathy.     Cervical: No cervical adenopathy.  Skin:    General: Skin is warm and dry.  Neurological:     General: No focal deficit present.     Mental Status: He is  alert and oriented to person, place, and time.           Assessment & Plan:  Viral URI- no evidence of bacterial infxn on PE.  Reviewed dx w/ pt and supportive care.  Pt expressed understanding and is in agreement w/ plan.   R TMJ pain- new.  Discussed teeth grinding and clenching- particularly in times of stress.  Start OTC NSAIDs.  Reviewed supportive care and red flags that should prompt return.  Pt expressed understanding and is in agreement w/ plan.

## 2018-06-19 NOTE — Patient Instructions (Signed)
Follow up as scheduled or as needed START daily Claritin or Zyrtec to improve ear congestion and nasal drainage Drink LOTS of fluids REST! Ibuprofen or Aleve will improve your jaw pain Try and pay attention to if you are clenching your jaw or grinding your teeth Call with any questions or concerns Hang in there!

## 2018-06-28 ENCOUNTER — Other Ambulatory Visit: Payer: Self-pay | Admitting: Family Medicine

## 2018-07-10 ENCOUNTER — Ambulatory Visit (INDEPENDENT_AMBULATORY_CARE_PROVIDER_SITE_OTHER): Payer: Managed Care, Other (non HMO) | Admitting: Family Medicine

## 2018-07-10 ENCOUNTER — Encounter: Payer: Self-pay | Admitting: Family Medicine

## 2018-07-10 ENCOUNTER — Other Ambulatory Visit: Payer: Self-pay

## 2018-07-10 VITALS — BP 118/78 | HR 81 | Temp 98.0°F | Resp 16 | Ht 67.0 in | Wt 166.1 lb

## 2018-07-10 DIAGNOSIS — E119 Type 2 diabetes mellitus without complications: Secondary | ICD-10-CM

## 2018-07-10 LAB — HM DIABETES EYE EXAM

## 2018-07-10 LAB — BASIC METABOLIC PANEL
BUN: 23 mg/dL (ref 6–23)
CHLORIDE: 98 meq/L (ref 96–112)
CO2: 33 mEq/L — ABNORMAL HIGH (ref 19–32)
CREATININE: 1.76 mg/dL — AB (ref 0.40–1.50)
Calcium: 10.1 mg/dL (ref 8.4–10.5)
GFR: 39.98 mL/min — ABNORMAL LOW (ref >60.00)
Glucose, Bld: 108 mg/dL — ABNORMAL HIGH (ref 70–99)
Potassium: 5.1 mEq/L (ref 3.5–5.1)
Sodium: 138 mEq/L (ref 135–145)

## 2018-07-10 LAB — HEMOGLOBIN A1C: Hgb A1c MFr Bld: 6.5 % (ref 4.6–6.5)

## 2018-07-10 NOTE — Assessment & Plan Note (Signed)
Chronic problem.  Currently asymptomatic.  UTD on foot exam, eye exam, microalbumin.  Check labs.  Adjust tx prn.

## 2018-07-10 NOTE — Progress Notes (Signed)
   Subjective:    Patient ID: Jon Richardson., male    DOB: 27-Jun-1960, 58 y.o.   MRN: [id number]  HPI DM- chronic problem. UTD on eye exam, foot exam, microalbumin.  Currently diet controlled.  Pt has lost 6 lbs since last visit.  Denies CP, SOB, HAs, visual changes, abd pain, N/V.  No numbness/tingling of hands/feet.   Review of Systems For ROS see HPI     Objective:   Physical Exam Vitals signs reviewed.  Constitutional:      General: He is not in acute distress.    Appearance: He is well-developed.  HENT:     Head: Normocephalic and atraumatic.  Eyes:     Conjunctiva/sclera: Conjunctivae normal.     Pupils: Pupils are equal, round, and reactive to light.  Neck:     Musculoskeletal: Normal range of motion and neck supple.     Thyroid: No thyromegaly.  Cardiovascular:     Rate and Rhythm: Normal rate and regular rhythm.     Heart sounds: Normal heart sounds. No murmur.  Pulmonary:     Effort: Pulmonary effort is normal. No respiratory distress.     Breath sounds: Normal breath sounds.  Abdominal:     General: Bowel sounds are normal. There is no distension.     Palpations: Abdomen is soft.  Lymphadenopathy:     Cervical: No cervical adenopathy.  Skin:    General: Skin is warm and dry.  Neurological:     Mental Status: He is alert and oriented to person, place, and time.     Cranial Nerves: No cranial nerve deficit.  Psychiatric:        Behavior: Behavior normal.           Assessment & Plan:

## 2018-07-10 NOTE — Patient Instructions (Signed)
Follow up in 3-4 months to recheck diabetes and cholesterol We'll notify you of your lab results and make any changes if needed Keep up the good work!  You look great! Call with any questions or concerns Stay safe!!!

## 2018-07-11 ENCOUNTER — Encounter: Payer: Self-pay | Admitting: Family Medicine

## 2018-07-20 ENCOUNTER — Telehealth: Payer: Self-pay | Admitting: Family Medicine

## 2018-07-20 NOTE — Telephone Encounter (Signed)
Please let me know when we can schedule pt for a lab app to recheck his BMP

## 2018-07-23 NOTE — Telephone Encounter (Signed)
Scheduled

## 2018-07-24 ENCOUNTER — Other Ambulatory Visit (INDEPENDENT_AMBULATORY_CARE_PROVIDER_SITE_OTHER): Payer: Managed Care, Other (non HMO)

## 2018-07-24 ENCOUNTER — Other Ambulatory Visit: Payer: Self-pay

## 2018-07-24 DIAGNOSIS — R7989 Other specified abnormal findings of blood chemistry: Secondary | ICD-10-CM

## 2018-07-24 LAB — BASIC METABOLIC PANEL
BUN: 30 mg/dL — ABNORMAL HIGH (ref 6–23)
CO2: 33 mEq/L — ABNORMAL HIGH (ref 19–32)
Calcium: 10 mg/dL (ref 8.4–10.5)
Chloride: 98 mEq/L (ref 96–112)
Creatinine, Ser: 1.34 mg/dL (ref 0.40–1.50)
GFR: 54.76 mL/min — AB (ref >60.00)
Glucose, Bld: 98 mg/dL (ref 70–99)
POTASSIUM: 5.2 meq/L — AB (ref 3.5–5.1)
Sodium: 137 mEq/L (ref 135–145)

## 2018-08-05 ENCOUNTER — Encounter: Payer: Self-pay | Admitting: Family Medicine

## 2018-08-06 ENCOUNTER — Encounter: Payer: Self-pay | Admitting: Family Medicine

## 2018-08-07 ENCOUNTER — Ambulatory Visit (INDEPENDENT_AMBULATORY_CARE_PROVIDER_SITE_OTHER): Payer: Managed Care, Other (non HMO) | Admitting: Family Medicine

## 2018-08-07 ENCOUNTER — Encounter: Payer: Self-pay | Admitting: Family Medicine

## 2018-08-07 VITALS — BP 123/77 | HR 78 | Temp 97.8°F | Ht 67.0 in

## 2018-08-07 DIAGNOSIS — R6889 Other general symptoms and signs: Secondary | ICD-10-CM | POA: Diagnosis not present

## 2018-08-07 DIAGNOSIS — R509 Fever, unspecified: Secondary | ICD-10-CM

## 2018-08-07 DIAGNOSIS — Z20822 Contact with and (suspected) exposure to covid-19: Secondary | ICD-10-CM

## 2018-08-07 DIAGNOSIS — W57XXXA Bitten or stung by nonvenomous insect and other nonvenomous arthropods, initial encounter: Secondary | ICD-10-CM

## 2018-08-07 MED ORDER — DOXYCYCLINE HYCLATE 100 MG PO TABS: 100.0000 mg | ORAL_TABLET | Freq: Two times a day (BID) | ORAL | 0 refills | Status: DC

## 2018-08-07 NOTE — Progress Notes (Signed)
I have discussed the procedure for the virtual visit with the patient who has given consent to proceed with assessment and treatment.   Daylyn Azbill, CMA     

## 2018-08-07 NOTE — Progress Notes (Signed)
Virtual Visit via Video   I connected with Jon Richardson on 08/07/18 at 10:30 AM EDT by a video enabled telemedicine application and verified that I am speaking with the correct person using two identifiers. Location patient: Home Location provider: [facility], Office Persons participating in the virtual visit: pt and myself  I discussed the limitations of evaluation and management by telemedicine and the availability of in person appointments. The patient expressed understanding and agreed to proceed.  Subjective:   HPI:  Fever- 'I very rarely ever run a fever'.  Found tick 8 days ago and fever started 3-4 days agoPt feels that his fever is related to tick bite and not COVID.  Some body aches.  No rash.  + HA.  Pt is fearful for Lyme disease.  Tm 101.1  Is now taking scheduled Tylenol.  O2 levels have been stable at 98%.  No cough, no SOB.  + fatigue.    ROS: See pertinent positives and negatives per HPI.  Patient Active Problem List   Diagnosis Date Noted  . Chest pain 06/08/2015  . Hyperlipidemia 06/08/2015  . Generalized anxiety disorder 05/20/2015  . Transient vision disturbance of right eye 04/23/2015  . Diet-controlled diabetes mellitus ([place]) 11/04/2014  . Annual physical exam 09/04/2014  . Cervical disc disorder with radiculopathy of cervical region 08/29/2012  . 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 BPH (benign prostatic hyperplasia) 07/22/2008  . Back pain-chronic-status post surgery 07/22/2008  . GERD 11/12/2007  . Anxiety-   07/11/2007  . PERSISTENT DISORDER INITIATING/MAINTAINING SLEEP 05/30/2007  . OTHER CHRONIC SINUSITIS 05/30/2007  . 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 HERPES SIMPLEX, UNCOMPLICATED 41/93/7902  . IRRITABLE BOWEL SYNDROME 11/10/2006    Social History   Tobacco Use  . Smoking status: Never Smoker  . Smokeless tobacco: Never Used  Substance Use Topics  . Alcohol use: Yes    Alcohol/week: 0.0 standard drinks    Comment:  Approximately once a month    Current Outpatient Medications:  .  Alpha-D-Galactosidase (BEANO) TABS, Take by mouth as needed., Disp: , Rfl:  .  ALPRAZolam (XANAX) 1 MG tablet, TAKE 1/2 TO 1 BY MOUTH ONCE DAILY AS NEEDED, Disp: 30 tablet, Rfl: 2 .  aspirin 81 MG tablet, Take 81 mg by mouth daily., Disp: , Rfl:  .  dicyclomine (BENTYL) 10 MG capsule, Take 1 capsule (10 mg total) by mouth 3 (three) times daily before meals., Disp: 90 capsule, Rfl: 11 .  famotidine (PEPCID) 40 MG tablet, Take 1 tablet (40 mg total) by mouth at bedtime., Disp: 30 tablet, Rfl: 11 .  fenofibrate 160 MG tablet, Take 1 tablet (160 mg total) by mouth daily., Disp: 30 tablet, Rfl: 3 .  fluticasone (FLONASE) 50 MCG/ACT nasal spray, Place 2 sprays into both nostrils daily., Disp: 16 g, Rfl: 6 .  Lecithin 1200 MG CAPS, Take by mouth daily., Disp: , Rfl:  .  Maca 500 MG CAPS, Take by mouth daily., Disp: , Rfl:  .  magnesium 30 MG tablet, Take 30 mg by mouth daily., Disp: , Rfl:  .  Needle, Disp, (HYPODERMIC NEEDLE 23GX1") 23G X 1" MISC, Please use one needle each time testosterone is given., Disp: 25 each, Rfl: 1 .  NEEDLE, DISP, 18 G 18G X 1" MISC, Use one needle each time testosterone is drawn for injection., Disp: 25 each, Rfl: 12 .  OVER THE COUNTER MEDICATION, [employer] triple action joint, Disp: , Rfl:  .  pantoprazole (PROTONIX) 40 MG tablet, TAKE ONE  TABLET BY MOUTH DAILY, Disp: 30 tablet, Rfl: 5 .  RABEprazole (ACIPHEX) 20 MG tablet, Take 1 tablet (20 mg total) by mouth 2 (two) times daily., Disp: 60 tablet, Rfl: 6 .  SUPER B COMPLEX/C PO, Take by mouth daily., Disp: , Rfl:  .  Syringe, Disposable, 3 ML MISC, Please use one syringe each time testosterone injection is given., Disp: 25 each, Rfl: 1 .  testosterone cypionate (DEPOTESTOSTERONE CYPIONATE) 200 MG/ML injection, Inject 1 mL (200 mg total) into the muscle every 14 (fourteen) days., Disp: 10 mL, Rfl: 3 .  valACYclovir (VALTREX) 500 MG tablet, TAKE ONE TABLET  BY MOUTH DAILY, Disp: 30 tablet, Rfl: 5 .  Zinc 50 MG CAPS, Take by mouth., Disp: , Rfl:   Allergies  Allergen Reactions  . Erythromycin Diarrhea    abd cramps    Objective:   BP 123/77   Pulse 78   Temp 97.8 F (36.6 C)   Resp (!) 98   Ht 5\' 7"  (1.702 m)   BMI 26.02 kg/m  AAOx3, NAD NCAT, EOMI No obvious CN deficits Coloring WNL Pt is able to speak clearly, coherently without shortness of breath or increased work of breathing.  Thought process is linear.  Mood is appropriate.   Assessment and Plan:   Fever- pt is well appearing making this a difficult dx.  No obvious rash to support RMSF or Lyme.  + myalgias, HA, fever are all sxs of RMSF/Lyme but are also sxs of COVID.  Start Doxy empirically but pt given strict instructions to self quarantine until 3 days fever free w/o use of tylenol.  Encouraged lots of fluids, rest, and monitoring for cough/SOB.  Pt expressed understanding and is in agreement w/ plan.   Tick bite- new.  No evidence of localized infxn but given onset of sxs will tx w/ Doxy to cover possible tick borne illness.  Annye Asa, MD 08/07/2018

## 2018-08-09 ENCOUNTER — Encounter: Payer: Self-pay | Admitting: Family Medicine

## 2018-08-10 ENCOUNTER — Encounter: Payer: Self-pay | Admitting: Family Medicine

## 2018-08-23 ENCOUNTER — Encounter: Payer: Self-pay | Admitting: Family Medicine

## 2018-08-24 ENCOUNTER — Other Ambulatory Visit: Payer: Self-pay

## 2018-08-24 ENCOUNTER — Ambulatory Visit (INDEPENDENT_AMBULATORY_CARE_PROVIDER_SITE_OTHER): Payer: Managed Care, Other (non HMO) | Admitting: Family Medicine

## 2018-08-24 ENCOUNTER — Encounter: Payer: Self-pay | Admitting: Family Medicine

## 2018-08-24 VITALS — BP 126/69 | HR 86 | Temp 97.4°F | Ht 67.0 in | Wt 160.0 lb

## 2018-08-24 DIAGNOSIS — M62830 Muscle spasm of back: Secondary | ICD-10-CM

## 2018-08-24 MED ORDER — CYCLOBENZAPRINE HCL 10 MG PO TABS: 10.0000 mg | ORAL_TABLET | Freq: Three times a day (TID) | ORAL | 0 refills | Status: DC | PRN

## 2018-08-24 NOTE — Progress Notes (Signed)
Virtual Visit via Video   I connected with patient on 08/24/18 at 10:40 AM EDT by a video enabled telemedicine application and verified that I am speaking with the correct person using two identifiers.  Location patient: Home Location provider: [facility], Office Persons participating in the virtual visit: Patient, Provider, [place] ([place])  I discussed the limitations of evaluation and management by telemedicine and the availability of in person appointments. The patient expressed understanding and agreed to proceed.  Subjective:   HPI:   Back spasm- pt has been working in the yard and finds his back spasming at night.  Is able to be up and about during the day w/o difficulty.  Taking Advil w/ mild relief of soreness but Advil is not helping the spasm at night.  sxs started 3 days ago.  No bowel or bladder incontinence.  No radiation of pain.  L sided.  Eases w/ activity.  Tightens at rest.  Has taken Flexeril previously.    ROS:   See pertinent positives and negatives per HPI.  Patient Active Problem List   Diagnosis Date Noted  . Chest pain 06/08/2015  . Hyperlipidemia 06/08/2015  . Generalized anxiety disorder 05/20/2015  . Transient vision disturbance of right eye 04/23/2015  . Diet-controlled diabetes mellitus ([place]) 11/04/2014  . Annual physical exam 09/04/2014  . Cervical disc disorder with radiculopathy of cervical region 08/29/2012  . 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 BPH (benign prostatic hyperplasia) 07/22/2008  . Back pain-chronic-status post surgery 07/22/2008  . GERD 11/12/2007  . Anxiety-   07/11/2007  . PERSISTENT DISORDER INITIATING/MAINTAINING SLEEP 05/30/2007  . OTHER CHRONIC SINUSITIS 05/30/2007  . 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 HERPES SIMPLEX, UNCOMPLICATED 62/95/2841  . IRRITABLE BOWEL SYNDROME 11/10/2006    Social History   Tobacco Use  . Smoking status: Never Smoker  . Smokeless tobacco: Never Used  Substance Use  Topics  . Alcohol use: Yes    Alcohol/week: 0.0 standard drinks    Comment: Approximately once a month    Current Outpatient Medications:  .  Alpha-D-Galactosidase (BEANO) TABS, Take by mouth as needed., Disp: , Rfl:  .  ALPRAZolam (XANAX) 1 MG tablet, TAKE 1/2 TO 1 BY MOUTH ONCE DAILY AS NEEDED, Disp: 30 tablet, Rfl: 2 .  aspirin 81 MG tablet, Take 81 mg by mouth daily., Disp: , Rfl:  .  doxycycline (VIBRA-TABS) 100 MG tablet, Take 1 tablet (100 mg total) by mouth 2 (two) times daily., Disp: 20 tablet, Rfl: 0 .  famotidine (PEPCID) 40 MG tablet, Take 1 tablet (40 mg total) by mouth at bedtime., Disp: 30 tablet, Rfl: 11 .  fenofibrate 160 MG tablet, Take 1 tablet (160 mg total) by mouth daily., Disp: 30 tablet, Rfl: 3 .  fluticasone (FLONASE) 50 MCG/ACT nasal spray, Place 2 sprays into both nostrils daily., Disp: 16 g, Rfl: 6 .  Lecithin 1200 MG CAPS, Take by mouth daily., Disp: , Rfl:  .  Maca 500 MG CAPS, Take by mouth daily., Disp: , Rfl:  .  magnesium 30 MG tablet, Take 30 mg by mouth daily., Disp: , Rfl:  .  Needle, Disp, (HYPODERMIC NEEDLE 23GX1") 23G X 1" MISC, Please use one needle each time testosterone is given., Disp: 25 each, Rfl: 1 .  NEEDLE, DISP, 18 G 18G X 1" MISC, Use one needle each time testosterone is drawn for injection., Disp: 25 each, Rfl: 12 .  OVER THE COUNTER MEDICATION, [employer] triple action joint, Disp: , Rfl:  .  pantoprazole (PROTONIX) 40 MG tablet, TAKE ONE TABLET BY MOUTH DAILY, Disp: 30 tablet, Rfl: 5 .  RABEprazole (ACIPHEX) 20 MG tablet, Take 1 tablet (20 mg total) by mouth 2 (two) times daily., Disp: 60 tablet, Rfl: 6 .  SUPER B COMPLEX/C PO, Take by mouth daily., Disp: , Rfl:  .  Syringe, Disposable, 3 ML MISC, Please use one syringe each time testosterone injection is given., Disp: 25 each, Rfl: 1 .  testosterone cypionate (DEPOTESTOSTERONE CYPIONATE) 200 MG/ML injection, Inject 1 mL (200 mg total) into the muscle every 14 (fourteen) days., Disp: 10  mL, Rfl: 3 .  valACYclovir (VALTREX) 500 MG tablet, TAKE ONE TABLET BY MOUTH DAILY, Disp: 30 tablet, Rfl: 5 .  Zinc 50 MG CAPS, Take by mouth., Disp: , Rfl:  .  dicyclomine (BENTYL) 10 MG capsule, Take 1 capsule (10 mg total) by mouth 3 (three) times daily before meals., Disp: 90 capsule, Rfl: 11  Allergies  Allergen Reactions  . Erythromycin Diarrhea    abd cramps    Objective:   BP 126/69   Pulse 86   Temp (!) 97.4 F (36.3 C)   Ht 5\' 7"  (1.702 m)   Wt 160 lb (72.6 kg)   SpO2 98%   BMI 25.06 kg/m  AAOx3, NAD NCAT, EOMI No obvious CN deficits Coloring WNL Pt is able to speak clearly, coherently without shortness of breath or increased work of breathing.  Thought process is linear.  Mood is appropriate.   Assessment and Plan:   Back spasm- new.  No red flags on hx.  Pt appears well on PE.  Start Flexeril prn.  Reviewed supportive care and red flags that should prompt return.  Pt expressed understanding and is in agreement w/ plan.    Annye Asa, MD 08/24/2018

## 2018-08-24 NOTE — Progress Notes (Signed)
I have discussed the procedure for the virtual visit with the patient who has given consent to proceed with assessment and treatment.   Lekesha Claw, CMA     

## 2018-10-01 ENCOUNTER — Other Ambulatory Visit: Payer: Self-pay | Admitting: Family Medicine

## 2018-10-02 ENCOUNTER — Encounter: Payer: Self-pay | Admitting: Family Medicine

## 2018-10-04 ENCOUNTER — Ambulatory Visit: Payer: Managed Care, Other (non HMO) | Admitting: Family Medicine

## 2018-10-04 ENCOUNTER — Other Ambulatory Visit: Payer: Self-pay

## 2018-10-04 ENCOUNTER — Encounter: Payer: Self-pay | Admitting: Family Medicine

## 2018-10-04 VITALS — BP 121/81 | HR 90 | Temp 97.9°F | Resp 16 | Wt 170.2 lb

## 2018-10-04 DIAGNOSIS — M25541 Pain in joints of right hand: Secondary | ICD-10-CM

## 2018-10-04 DIAGNOSIS — E291 Testicular hypofunction: Secondary | ICD-10-CM | POA: Diagnosis not present

## 2018-10-04 DIAGNOSIS — W57XXXA Bitten or stung by nonvenomous insect and other nonvenomous arthropods, initial encounter: Secondary | ICD-10-CM

## 2018-10-04 DIAGNOSIS — M25542 Pain in joints of left hand: Secondary | ICD-10-CM

## 2018-10-04 DIAGNOSIS — S30860A Insect bite (nonvenomous) of lower back and pelvis, initial encounter: Secondary | ICD-10-CM

## 2018-10-04 NOTE — Assessment & Plan Note (Signed)
Following w/ Dr Thomasene Mohair.  Has appt next week and needs labs drawn.

## 2018-10-04 NOTE — Patient Instructions (Signed)
Follow up as needed or as scheduled We'll notify you of your lab results and make any changes if needed Call with any questions or concerns Hang in there!!!

## 2018-10-04 NOTE — Progress Notes (Signed)
   Subjective:    Patient ID: Jon Richardson., male    DOB: [birth year], 58 y.o.   MRN: [id number]  HPI Tick bite- pt reports since recent tick bite hands will get very tight overnight.  + stiffness.  Pt reports stiffness will improve w/ activity and as morning goes on.  No redness or swelling.  + family hx of arthritis.  Pt completed course of Doxy.  No rash  Low T- pt needs labs for Dr Thomasene Mohair.   Review of Systems For ROS see HPI     Objective:   Physical Exam Vitals signs reviewed.  Constitutional:      General: He is not in acute distress.    Appearance: Normal appearance. He is not ill-appearing.  HENT:     Head: Normocephalic and atraumatic.  Musculoskeletal:        General: No swelling, tenderness or deformity.  Skin:    General: Skin is warm and dry.     Findings: No rash.  Neurological:     General: No focal deficit present.     Mental Status: He is alert.  Psychiatric:        Mood and Affect: Mood normal.        Behavior: Behavior normal.        Thought Content: Thought content normal.           Assessment & Plan:  Tick bite- new.  Pt completed round of Doxy.  Given joint stiffness will check for RMSF or Lyme but this is unlikely.  Hand pain- new.  Pt reports AM pain and stiffness.  Denies swelling or deformity.  Check labs to assess for rheumatoid or other autoimmune issues.  Tylenol prn. Reviewed supportive care and red flags that should prompt return.  Pt expressed understanding and is in agreement w/ plan.

## 2018-10-05 LAB — SEDIMENTATION RATE: Sed Rate: 12 mm/hr (ref 0–20)

## 2018-10-05 LAB — PSA: PSA: 1.02 ng/mL (ref 0.10–4.00)

## 2018-10-06 LAB — RHEUMATOID FACTOR: Rhuematoid fact SerPl-aCnc: <14 IU/mL (ref <14)

## 2018-10-06 LAB — ROCKY MTN SPOTTED FVR ABS PNL(IGG+IGM)
RMSF IgG: DETECTED — AB
RMSF IgM: NOT DETECTED

## 2018-10-06 LAB — TESTOSTERONE TOTAL,FREE,BIO, MALES
Albumin: 4.5 g/dL (ref 3.6–5.1)
Sex Hormone Binding: 20 nmol/L — ABNORMAL LOW (ref 22–77)
Testosterone, Bioavailable: 305.1 ng/dL (ref >=110.0)
Testosterone, Free: 148.4 pg/mL (ref 46.0–224.0)
Testosterone: 697 ng/dL (ref 250–827)

## 2018-10-06 LAB — ANA: Anti Nuclear Antibody (ANA): NEGATIVE

## 2018-10-06 LAB — B. BURGDORFI ANTIBODIES: B burgdorferi Ab IgG+IgM: <0.9 index

## 2018-10-06 LAB — REFLEX RMSF IGG TITER: RMSF IgG Titer: 1:512 {titer} — ABNORMAL HIGH

## 2018-10-29 ENCOUNTER — Ambulatory Visit: Payer: Managed Care, Other (non HMO) | Admitting: Family Medicine

## 2018-10-29 ENCOUNTER — Other Ambulatory Visit: Payer: Self-pay

## 2018-10-29 ENCOUNTER — Encounter: Payer: Self-pay | Admitting: Family Medicine

## 2018-10-29 ENCOUNTER — Ambulatory Visit (INDEPENDENT_AMBULATORY_CARE_PROVIDER_SITE_OTHER): Payer: Managed Care, Other (non HMO) | Admitting: Family Medicine

## 2018-10-29 DIAGNOSIS — E7849 Other hyperlipidemia: Secondary | ICD-10-CM

## 2018-10-29 DIAGNOSIS — E119 Type 2 diabetes mellitus without complications: Secondary | ICD-10-CM | POA: Diagnosis not present

## 2018-10-29 NOTE — Progress Notes (Signed)
I have discussed the procedure for the virtual visit with the patient who has given consent to proceed with assessment and treatment.   Nitish Roes L Athalie Newhard, CMA     

## 2018-10-29 NOTE — Progress Notes (Signed)
Virtual Visit via Video   I connected with patient on 10/29/18 at 10:00 AM EDT by a video enabled telemedicine application and verified that I am speaking with the correct person using two identifiers.  Location patient: Home Location provider: Acupuncturist, Office Persons participating in the virtual visit: Patient, Provider, [place] (Jess B)  I discussed the limitations of evaluation and management by telemedicine and the availability of in person appointments. The patient expressed understanding and agreed to proceed.  Subjective:   HPI:   DM- chronic problem, currently diet controlled.  UTD on foot exam, eye exam, microalbumin.  Denies symptomatic lows.  No numbness/tingling of hands/feet.  Hyperlipidemia- chronic problem, on Fenofibrate 160mg  daily.  Exercising regularly.  No CP, SOB, HAs, abd pain, N/V.  ROS:   See pertinent positives and negatives per HPI.  Patient Active Problem List   Diagnosis Date Noted  . Chest pain 06/08/2015  . Hyperlipidemia 06/08/2015  . Generalized anxiety disorder 05/20/2015  . Transient vision disturbance of right eye 04/23/2015  . Diet-controlled diabetes mellitus ([place]) 11/04/2014  . Annual physical exam 09/04/2014  . Cervical disc disorder with radiculopathy of cervical region 08/29/2012  . 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 BPH (benign prostatic hyperplasia) 07/22/2008  . Back pain-chronic-status post surgery 07/22/2008  . GERD 11/12/2007  . Anxiety-   07/11/2007  . PERSISTENT DISORDER INITIATING/MAINTAINING SLEEP 05/30/2007  . OTHER CHRONIC SINUSITIS 05/30/2007  . 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 HERPES SIMPLEX, UNCOMPLICATED 40/98/1191  . IRRITABLE BOWEL SYNDROME 11/10/2006    Social History   Tobacco Use  . Smoking status: Never Smoker  . Smokeless tobacco: Never Used  Substance Use Topics  . Alcohol use: Yes    Alcohol/week: 0.0 standard drinks    Comment: Approximately once a month    Current Outpatient  Medications:  .  Alpha-D-Galactosidase (BEANO) TABS, Take by mouth as needed., Disp: , Rfl:  .  ALPRAZolam (XANAX) 1 MG tablet, TAKE 1/2 TO 1 BY MOUTH ONCE DAILY AS NEEDED, Disp: 30 tablet, Rfl: 2 .  aspirin 81 MG tablet, Take 81 mg by mouth daily., Disp: , Rfl:  .  cyclobenzaprine (FLEXERIL) 10 MG tablet, Take 1 tablet (10 mg total) by mouth 3 (three) times daily as needed for muscle spasms., Disp: 30 tablet, Rfl: 0 .  dicyclomine (BENTYL) 10 MG capsule, Take 1 capsule (10 mg total) by mouth 3 (three) times daily before meals., Disp: 90 capsule, Rfl: 11 .  famotidine (PEPCID) 40 MG tablet, Take 1 tablet (40 mg total) by mouth at bedtime., Disp: 30 tablet, Rfl: 11 .  fenofibrate 160 MG tablet, TAKE ONE TABLET BY MOUTH DAILY, Disp: 30 tablet, Rfl: 2 .  fluticasone (FLONASE) 50 MCG/ACT nasal spray, Place 2 sprays into both nostrils daily., Disp: 16 g, Rfl: 6 .  Lecithin 1200 MG CAPS, Take by mouth daily., Disp: , Rfl:  .  Maca 500 MG CAPS, Take by mouth daily., Disp: , Rfl:  .  magnesium 30 MG tablet, Take 30 mg by mouth daily., Disp: , Rfl:  .  Needle, Disp, (HYPODERMIC NEEDLE 23GX1") 23G X 1" MISC, Please use one needle each time testosterone is given., Disp: 25 each, Rfl: 1 .  NEEDLE, DISP, 18 G 18G X 1" MISC, Use one needle each time testosterone is drawn for injection., Disp: 25 each, Rfl: 12 .  OVER THE COUNTER MEDICATION, [employer] triple action joint, Disp: , Rfl:  .  pantoprazole (PROTONIX) 40 MG tablet, TAKE ONE TABLET BY MOUTH  DAILY, Disp: 30 tablet, Rfl: 5 .  RABEprazole (ACIPHEX) 20 MG tablet, Take 1 tablet (20 mg total) by mouth 2 (two) times daily., Disp: 60 tablet, Rfl: 6 .  SUPER B COMPLEX/C PO, Take by mouth daily., Disp: , Rfl:  .  Syringe, Disposable, 3 ML MISC, Please use one syringe each time testosterone injection is given., Disp: 25 each, Rfl: 1 .  testosterone cypionate (DEPOTESTOSTERONE CYPIONATE) 200 MG/ML injection, Inject 1 mL (200 mg total) into the muscle every 14  (fourteen) days., Disp: 10 mL, Rfl: 3 .  Testosterone Cypionate 200 MG/ML SOLN, INJECT 1 MILLILITER EVERY 14 DAYS, Disp: , Rfl:  .  valACYclovir (VALTREX) 500 MG tablet, TAKE ONE TABLET BY MOUTH DAILY, Disp: 30 tablet, Rfl: 5 .  Zinc 50 MG CAPS, Take by mouth., Disp: , Rfl:   Allergies  Allergen Reactions  . Erythromycin Diarrhea    abd cramps    Objective:   There were no vitals taken for this visit.  AAOx3, NAD NCAT, EOMI No obvious CN deficits Coloring WNL Pt is able to speak clearly, coherently without shortness of breath or increased work of breathing.  Thought process is linear.  Mood is appropriate.   Assessment and Plan:   DM- chronic problem, diet controlled.  UTD on foot exam, eye exam, microalbumin.  Check labs.  Adjust tx plan prn.  Hyperlipidemia- chronic problem, on Fenofibrate.  Applauded efforts at exercise.  Check labs.  Adjust meds prn    Annye Asa, MD 10/29/2018

## 2018-10-31 ENCOUNTER — Encounter: Payer: Self-pay | Admitting: Family Medicine

## 2018-10-31 ENCOUNTER — Other Ambulatory Visit: Payer: Self-pay

## 2018-10-31 ENCOUNTER — Ambulatory Visit (INDEPENDENT_AMBULATORY_CARE_PROVIDER_SITE_OTHER): Payer: Managed Care, Other (non HMO)

## 2018-10-31 DIAGNOSIS — E7849 Other hyperlipidemia: Secondary | ICD-10-CM | POA: Diagnosis not present

## 2018-10-31 DIAGNOSIS — E119 Type 2 diabetes mellitus without complications: Secondary | ICD-10-CM | POA: Diagnosis not present

## 2018-10-31 LAB — HEPATIC FUNCTION PANEL
ALT: 18 U/L (ref 0–53)
AST: 19 U/L (ref 0–37)
Albumin: 4.7 g/dL (ref 3.5–5.2)
Alkaline Phosphatase: 37 U/L — ABNORMAL LOW (ref 39–117)
Bilirubin, Direct: 0.1 mg/dL (ref 0.0–0.3)
Total Bilirubin: 0.6 mg/dL (ref 0.2–1.2)
Total Protein: 7.2 g/dL (ref 6.0–8.3)

## 2018-10-31 LAB — CBC WITH DIFFERENTIAL/PLATELET
Basophils Absolute: 0.1 10*3/uL (ref 0.0–0.1)
Basophils Relative: 1 % (ref 0.0–3.0)
Eosinophils Absolute: 0.1 10*3/uL (ref 0.0–0.7)
Eosinophils Relative: 1.8 % (ref 0.0–5.0)
HCT: 48 % (ref 39.0–52.0)
Hemoglobin: 15.9 g/dL (ref 13.0–17.0)
Lymphocytes Relative: 38.4 % (ref 12.0–46.0)
Lymphs Abs: 2.6 10*3/uL (ref 0.7–4.0)
MCHC: 33.2 g/dL (ref 30.0–36.0)
MCV: 94 fl (ref 78.0–100.0)
Monocytes Absolute: 0.5 10*3/uL (ref 0.1–1.0)
Monocytes Relative: 7.5 % (ref 3.0–12.0)
Neutro Abs: 3.5 10*3/uL (ref 1.4–7.7)
Neutrophils Relative %: 51.3 % (ref 43.0–77.0)
Platelets: 420 10*3/uL — ABNORMAL HIGH (ref 150.0–400.0)
RBC: 5.1 Mil/uL (ref 4.22–5.81)
RDW: 14 % (ref 11.5–15.5)
WBC: 6.8 10*3/uL (ref 4.0–10.5)

## 2018-10-31 LAB — LIPID PANEL
Cholesterol: 194 mg/dL (ref 0–200)
HDL: 56.1 mg/dL (ref >39.00)
LDL Cholesterol: 123 mg/dL — ABNORMAL HIGH (ref 0–99)
NonHDL: 138.26
Total CHOL/HDL Ratio: 3
Triglycerides: 78 mg/dL (ref 0.0–149.0)
VLDL: 15.6 mg/dL (ref 0.0–40.0)

## 2018-10-31 LAB — BASIC METABOLIC PANEL
BUN: 21 mg/dL (ref 6–23)
CO2: 29 mEq/L (ref 19–32)
Calcium: 9.8 mg/dL (ref 8.4–10.5)
Chloride: 100 mEq/L (ref 96–112)
Creatinine, Ser: 1.35 mg/dL (ref 0.40–1.50)
GFR: 54.24 mL/min — ABNORMAL LOW (ref >60.00)
Glucose, Bld: 116 mg/dL — ABNORMAL HIGH (ref 70–99)
Potassium: 4.9 mEq/L (ref 3.5–5.1)
Sodium: 138 mEq/L (ref 135–145)

## 2018-10-31 LAB — HEMOGLOBIN A1C: Hgb A1c MFr Bld: 6.5 % (ref 4.6–6.5)

## 2018-10-31 LAB — TSH: TSH: 3.98 u[IU]/mL (ref 0.35–4.50)

## 2018-11-01 ENCOUNTER — Encounter: Payer: Self-pay | Admitting: Family Medicine

## 2018-12-17 ENCOUNTER — Other Ambulatory Visit: Payer: Managed Care, Other (non HMO)

## 2018-12-28 ENCOUNTER — Other Ambulatory Visit: Payer: Self-pay | Admitting: Family Medicine

## 2019-01-07 ENCOUNTER — Encounter: Payer: Self-pay | Admitting: Family Medicine

## 2019-01-10 ENCOUNTER — Other Ambulatory Visit: Payer: Self-pay

## 2019-01-10 ENCOUNTER — Encounter: Payer: Self-pay | Admitting: Family Medicine

## 2019-01-10 ENCOUNTER — Ambulatory Visit (INDEPENDENT_AMBULATORY_CARE_PROVIDER_SITE_OTHER): Payer: Managed Care, Other (non HMO) | Admitting: Family Medicine

## 2019-01-10 DIAGNOSIS — H6981 Other specified disorders of Eustachian tube, right ear: Secondary | ICD-10-CM | POA: Diagnosis not present

## 2019-01-10 NOTE — Progress Notes (Signed)
Virtual Visit via Video   I connected with patient on 01/10/19 at  8:30 AM EDT by a video enabled telemedicine application and verified that I am speaking with the correct person using two identifiers.  Location patient: Home Location provider: Acupuncturist, Office Persons participating in the virtual visit: Patient, Provider, [place] (Jess B)  I discussed the limitations of evaluation and management by telemedicine and the availability of in person appointments. The patient expressed understanding and agreed to proceed.  Subjective:   HPI:   R ear pain- 'it feels like swimmers ear.  [place] in [place]'.  No fever.  2 episodes of HA.  sxs started ~2 week ago.  Pt was swimming 2 weeks ago.  No drainage.  No pain w/ manipulation of pinna.  No pain on L.  + nasal congestion- worse at night and in the AM.  Using Flonase.  ROS:   See pertinent positives and negatives per HPI.  Patient Active Problem List   Diagnosis Date Noted  . Chest pain 06/08/2015  . Hyperlipidemia 06/08/2015  . Generalized anxiety disorder 05/20/2015  . Transient vision disturbance of right eye 04/23/2015  . Diet-controlled diabetes mellitus ([place]) 11/04/2014  . Annual physical exam 09/04/2014  . Cervical disc disorder with radiculopathy of cervical region 08/29/2012  . 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 BPH (benign prostatic hyperplasia) 07/22/2008  . Back pain-chronic-status post surgery 07/22/2008  . GERD 11/12/2007  . Anxiety-   07/11/2007  . PERSISTENT DISORDER INITIATING/MAINTAINING SLEEP 05/30/2007  . OTHER CHRONIC SINUSITIS 05/30/2007  . 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 HERPES SIMPLEX, UNCOMPLICATED 0000000  . IRRITABLE BOWEL SYNDROME 11/10/2006    Social History   Tobacco Use  . Smoking status: Never Smoker  . Smokeless tobacco: Never Used  Substance Use Topics  . Alcohol use: Yes    Alcohol/week: 0.0 standard drinks    Comment: Approximately once a month    Current  Outpatient Medications:  .  Alpha-D-Galactosidase (BEANO) TABS, Take by mouth as needed., Disp: , Rfl:  .  ALPRAZolam (XANAX) 1 MG tablet, TAKE 1/2 TO 1 BY MOUTH ONCE DAILY AS NEEDED, Disp: 30 tablet, Rfl: 2 .  aspirin 81 MG tablet, Take 81 mg by mouth daily., Disp: , Rfl:  .  cyclobenzaprine (FLEXERIL) 10 MG tablet, Take 1 tablet (10 mg total) by mouth 3 (three) times daily as needed for muscle spasms., Disp: 30 tablet, Rfl: 0 .  dicyclomine (BENTYL) 10 MG capsule, Take 1 capsule (10 mg total) by mouth 3 (three) times daily before meals., Disp: 90 capsule, Rfl: 11 .  famotidine (PEPCID) 40 MG tablet, Take 1 tablet (40 mg total) by mouth at bedtime., Disp: 30 tablet, Rfl: 11 .  fenofibrate 160 MG tablet, TAKE ONE TABLET BY MOUTH DAILY, Disp: 90 tablet, Rfl: 1 .  fluticasone (FLONASE) 50 MCG/ACT nasal spray, Place 2 sprays into both nostrils daily., Disp: 16 g, Rfl: 6 .  Lecithin 1200 MG CAPS, Take by mouth daily., Disp: , Rfl:  .  Maca 500 MG CAPS, Take by mouth daily., Disp: , Rfl:  .  magnesium 30 MG tablet, Take 30 mg by mouth daily., Disp: , Rfl:  .  Needle, Disp, (HYPODERMIC NEEDLE 23GX1") 23G X 1" MISC, Please use one needle each time testosterone is given., Disp: 25 each, Rfl: 1 .  NEEDLE, DISP, 18 G 18G X 1" MISC, Use one needle each time testosterone is drawn for injection., Disp: 25 each, Rfl: 12 .  OVER THE COUNTER  MEDICATION, [employer] triple action joint, Disp: , Rfl:  .  pantoprazole (PROTONIX) 40 MG tablet, TAKE ONE TABLET BY MOUTH DAILY, Disp: 30 tablet, Rfl: 5 .  RABEprazole (ACIPHEX) 20 MG tablet, Take 1 tablet (20 mg total) by mouth 2 (two) times daily., Disp: 60 tablet, Rfl: 6 .  SUPER B COMPLEX/C PO, Take by mouth daily., Disp: , Rfl:  .  Syringe, Disposable, 3 ML MISC, Please use one syringe each time testosterone injection is given., Disp: 25 each, Rfl: 1 .  testosterone cypionate (DEPOTESTOSTERONE CYPIONATE) 200 MG/ML injection, Inject 1 mL (200 mg total) into the muscle  every 14 (fourteen) days., Disp: 10 mL, Rfl: 3 .  Testosterone Cypionate 200 MG/ML SOLN, INJECT 1 MILLILITER EVERY 14 DAYS, Disp: , Rfl:  .  valACYclovir (VALTREX) 500 MG tablet, TAKE ONE TABLET BY MOUTH DAILY, Disp: 30 tablet, Rfl: 4 .  Zinc 50 MG CAPS, Take by mouth., Disp: , Rfl:   Allergies  Allergen Reactions  . Erythromycin Diarrhea    abd cramps    Objective:   There were no vitals taken for this visit. AAOx3, NAD NCAT, EOMI No obvious CN deficits Coloring WNL No pain w/ manipulation of pinna No TTP over mastoid Pt is able to speak clearly, coherently without shortness of breath or increased work of breathing.  Thought process is linear.  Mood is appropriate.   Assessment and Plan:   R eustachian tube dysfxn- new.  No evidence of swimmers ear or systemic signs of infxn.  Suspect the pressure and discomfort is due to eustachian tube dysfxn from allergy congestion.  Continue Flonase.  Add OTC antihistamine.  Recommended 2-3 days course of decongestant.  Reviewed supportive care and red flags that should prompt return.  Pt expressed understanding and is in agreement w/ plan.   Annye Asa, MD 01/10/2019

## 2019-01-10 NOTE — Progress Notes (Signed)
I have discussed the procedure for the virtual visit with the patient who has given consent to proceed with assessment and treatment.   Unable to obtain vitals  Mckaila Duffus L Brooklyne Radke, CMA     

## 2019-04-03 ENCOUNTER — Encounter: Payer: Self-pay | Admitting: Family Medicine

## 2019-04-05 ENCOUNTER — Ambulatory Visit: Payer: Managed Care, Other (non HMO) | Admitting: Family Medicine

## 2019-04-05 ENCOUNTER — Other Ambulatory Visit: Payer: Self-pay

## 2019-04-05 ENCOUNTER — Encounter: Payer: Self-pay | Admitting: Family Medicine

## 2019-04-05 VITALS — BP 130/90 | HR 79 | Temp 97.9°F | Resp 16 | Wt 172.5 lb

## 2019-04-05 DIAGNOSIS — R200 Anesthesia of skin: Secondary | ICD-10-CM | POA: Diagnosis not present

## 2019-04-05 DIAGNOSIS — R519 Headache, unspecified: Secondary | ICD-10-CM

## 2019-04-05 DIAGNOSIS — E119 Type 2 diabetes mellitus without complications: Secondary | ICD-10-CM | POA: Diagnosis not present

## 2019-04-05 DIAGNOSIS — R42 Dizziness and giddiness: Secondary | ICD-10-CM

## 2019-04-05 DIAGNOSIS — E291 Testicular hypofunction: Secondary | ICD-10-CM

## 2019-04-05 DIAGNOSIS — R202 Paresthesia of skin: Secondary | ICD-10-CM | POA: Diagnosis not present

## 2019-04-05 DIAGNOSIS — R29898 Other symptoms and signs involving the musculoskeletal system: Secondary | ICD-10-CM

## 2019-04-05 DIAGNOSIS — R6889 Other general symptoms and signs: Secondary | ICD-10-CM | POA: Diagnosis not present

## 2019-04-05 LAB — CBC WITH DIFFERENTIAL/PLATELET
Basophils Absolute: 0.1 10*3/uL (ref 0.0–0.1)
Basophils Relative: 1.2 % (ref 0.0–3.0)
Eosinophils Absolute: 0.1 10*3/uL (ref 0.0–0.7)
Eosinophils Relative: 0.8 % (ref 0.0–5.0)
HCT: 51.9 % (ref 39.0–52.0)
Hemoglobin: 16.9 g/dL (ref 13.0–17.0)
Lymphocytes Relative: 31.5 % (ref 12.0–46.0)
Lymphs Abs: 2.6 10*3/uL (ref 0.7–4.0)
MCHC: 32.7 g/dL (ref 30.0–36.0)
MCV: 94.2 fl (ref 78.0–100.0)
Monocytes Absolute: 0.5 10*3/uL (ref 0.1–1.0)
Monocytes Relative: 6.2 % (ref 3.0–12.0)
Neutro Abs: 4.9 10*3/uL (ref 1.4–7.7)
Neutrophils Relative %: 60.3 % (ref 43.0–77.0)
Platelets: 497 10*3/uL — ABNORMAL HIGH (ref 150.0–400.0)
RBC: 5.51 Mil/uL (ref 4.22–5.81)
RDW: 14.3 % (ref 11.5–15.5)
WBC: 8.2 10*3/uL (ref 4.0–10.5)

## 2019-04-05 LAB — SEDIMENTATION RATE: Sed Rate: 15 mm/hr (ref 0–20)

## 2019-04-05 LAB — BASIC METABOLIC PANEL
BUN: 19 mg/dL (ref 6–23)
CO2: 32 mEq/L (ref 19–32)
Calcium: 10.2 mg/dL (ref 8.4–10.5)
Chloride: 98 mEq/L (ref 96–112)
Creatinine, Ser: 1.4 mg/dL (ref 0.40–1.50)
GFR: 51.94 mL/min — ABNORMAL LOW (ref >60.00)
Glucose, Bld: 120 mg/dL — ABNORMAL HIGH (ref 70–99)
Potassium: 5.2 mEq/L — ABNORMAL HIGH (ref 3.5–5.1)
Sodium: 138 mEq/L (ref 135–145)

## 2019-04-05 LAB — VITAMIN D 25 HYDROXY (VIT D DEFICIENCY, FRACTURES): VITD: 24.69 ng/mL — ABNORMAL LOW (ref 30.00–100.00)

## 2019-04-05 LAB — HEPATIC FUNCTION PANEL
ALT: 18 U/L (ref 0–53)
AST: 18 U/L (ref 0–37)
Albumin: 4.9 g/dL (ref 3.5–5.2)
Alkaline Phosphatase: 41 U/L (ref 39–117)
Bilirubin, Direct: 0.1 mg/dL (ref 0.0–0.3)
Total Bilirubin: 0.6 mg/dL (ref 0.2–1.2)
Total Protein: 7.8 g/dL (ref 6.0–8.3)

## 2019-04-05 LAB — HEMOGLOBIN A1C: Hgb A1c MFr Bld: 6.5 % (ref 4.6–6.5)

## 2019-04-05 LAB — TSH: TSH: 3.01 u[IU]/mL (ref 0.35–4.50)

## 2019-04-05 LAB — B12 AND FOLATE PANEL
Folate: 10.5 ng/mL (ref >5.9)
Vitamin B-12: 520 pg/mL (ref 211–911)

## 2019-04-05 NOTE — Patient Instructions (Signed)
We'll notify you of your lab results and determine the next steps INCREASE your water intake.  Your drop in blood pressure w/ standing indicates you need more water and to change positions slowly to allow yourself time to adjust Increasing your water intake will also likely help your headaches Try and eat a healthy, low carb diet.  Carbs will cause blood sugar spikes and crashes that can make you feel hot, sweaty, dizzy, and foggy Make sure you are eating regularly throughout the day.  Make sure you have snacks w/ protein to stabilize your blood sugar Call with any questions or concerns Hang in there!!

## 2019-04-05 NOTE — Progress Notes (Signed)
Subjective:    Patient ID: Jon Hipp., male    DOB: November 22, 1960, 58 y.o.   MRN: [id number]  HPI HA- 'nothing makes it go away'.  R frontal.  Will resolve spontaneously but does not respond to Advil.  Can wake him from sleep.  HAs have been daily for 'the last couple of weeks'.  Will have the sensation that 'head is foggy'.  Some memory issues/word finding difficulty.  No sensitivity to light/sound.  Numbness- pt reports he will wake at night and have numbness from elbow to hand and hand is cold.  Has occurred 3-4x in the last few months.  pt reports arms will feel 'heavy' when hanging up a towel.  Able to lift weights w/o difficulty.  Dizzy- occurring each morning.  Has sensation that he 'is going to pass out'.  Has been going on 'for awhle'- months.  Admits to poor diet recently.  Increased stress recently.  Pt admits to poor water intake and when he does drink he has coffee or soda.  'hot and sweaty all the time'- keeping house cold which is upsetting partner.  Is 'miserable at work' due to the temperature   Review of Systems For ROS see HPI  This visit occurred during the SARS-CoV-2 public health emergency.  Safety protocols were in place, including screening questions prior to the visit, additional usage of staff PPE, and extensive cleaning of exam room while observing appropriate contact time as indicated for disinfecting solutions.       Objective:   Physical Exam Vitals reviewed.  Constitutional:      General: He is not in acute distress.    Appearance: He is well-developed.  HENT:     Head: Normocephalic and atraumatic.  Eyes:     Conjunctiva/sclera: Conjunctivae normal.     Pupils: Pupils are equal, round, and reactive to light.  Neck:     Thyroid: No thyromegaly.  Cardiovascular:     Rate and Rhythm: Normal rate and regular rhythm.     Heart sounds: Normal heart sounds. No murmur.  Pulmonary:     Effort: Pulmonary effort is normal. No respiratory  distress.     Breath sounds: Normal breath sounds.  Abdominal:     General: Bowel sounds are normal. There is no distension.     Palpations: Abdomen is soft.  Musculoskeletal:     Cervical back: Normal range of motion and neck supple.  Lymphadenopathy:     Cervical: No cervical adenopathy.  Skin:    General: Skin is warm and dry.  Neurological:     Mental Status: He is alert and oriented to person, place, and time.     Cranial Nerves: No cranial nerve deficit, dysarthria or facial asymmetry.     Coordination: Coordination normal.     Gait: Gait normal.  Psychiatric:        Mood and Affect: Mood is anxious.           Assessment & Plan:  Daily HA- new.  No migrainous sxs.  Unclear if this is due to labile sugars, testosterone tx, or something else entirely.  Pt's PE WNL today.  No red flags.  Will check labs and if no obvious cause, will refer to Neuro for complete evaluation.  Dizziness- pt is orthostatic here in the office and his description of his AM events are consistent w/ orthostatic episode.  Encouraged him to increase water intake and change positions slowly.  Will follow.  Arm Heaviness/numbness/tinglin- new.  Check labs to r/o metabolic cause.  Suspect that the AM numbness is positional and reviewed this w/ him.  Will follow.  Feeling hot- new.  Unclear if this is hormonal, due to labile sugars, or other causes.  Encouraged healthy diet, regular eating, increased water intake.  Check labs.  Will follow.

## 2019-04-07 ENCOUNTER — Encounter: Payer: Self-pay | Admitting: Family Medicine

## 2019-04-08 ENCOUNTER — Encounter: Payer: Self-pay | Admitting: Family Medicine

## 2019-04-08 ENCOUNTER — Other Ambulatory Visit: Payer: Self-pay | Admitting: General Practice

## 2019-04-08 LAB — TESTOSTERONE TOTAL,FREE,BIO, MALES
Albumin: 4.8 g/dL (ref 3.6–5.1)
Sex Hormone Binding: 18 nmol/L — ABNORMAL LOW (ref 22–77)
Testosterone, Bioavailable: 371.9 ng/dL (ref >=110.0)
Testosterone, Free: 170.1 pg/mL (ref 46.0–224.0)
Testosterone: 757 ng/dL (ref 250–827)

## 2019-04-08 MED ORDER — VITAMIN D (ERGOCALCIFEROL) 1.25 MG (50000 UNIT) PO CAPS: 50000.0000 [IU] | ORAL_CAPSULE | ORAL | 0 refills | Status: DC

## 2019-04-11 NOTE — Assessment & Plan Note (Signed)
His heat intolerance, dizziness, and headache could all be hormonally related.  Will check levels and I encouraged him to discuss this w/ his managing physician.

## 2019-04-11 NOTE — Assessment & Plan Note (Signed)
Given his admitted poor dietary intake his dizziness, HA, and feeling hot and sweaty could be blood sugar instability.  Stressed need to eat regularly and a low carb diet.  Check labs.  Will follow.

## 2019-05-08 ENCOUNTER — Encounter: Payer: Managed Care, Other (non HMO) | Admitting: Family Medicine

## 2019-06-05 ENCOUNTER — Other Ambulatory Visit: Payer: Self-pay | Admitting: Family Medicine

## 2019-06-28 ENCOUNTER — Other Ambulatory Visit: Payer: Self-pay | Admitting: Family Medicine

## 2019-07-06 ENCOUNTER — Other Ambulatory Visit: Payer: Self-pay | Admitting: Family Medicine

## 2019-07-08 NOTE — Telephone Encounter (Signed)
Dr. Tabori's patient 

## 2019-08-01 ENCOUNTER — Encounter: Payer: Managed Care, Other (non HMO) | Admitting: Family Medicine

## 2019-08-05 ENCOUNTER — Encounter: Payer: Self-pay | Admitting: Family Medicine

## 2019-08-05 ENCOUNTER — Telehealth (INDEPENDENT_AMBULATORY_CARE_PROVIDER_SITE_OTHER): Payer: Managed Care, Other (non HMO) | Admitting: Family Medicine

## 2019-08-05 ENCOUNTER — Other Ambulatory Visit: Payer: Self-pay

## 2019-08-05 VITALS — BP 138/80 | Ht 67.0 in | Wt 171.0 lb

## 2019-08-05 DIAGNOSIS — I959 Hypotension, unspecified: Secondary | ICD-10-CM | POA: Diagnosis not present

## 2019-08-05 DIAGNOSIS — R0789 Other chest pain: Secondary | ICD-10-CM | POA: Diagnosis not present

## 2019-08-05 DIAGNOSIS — E119 Type 2 diabetes mellitus without complications: Secondary | ICD-10-CM | POA: Diagnosis not present

## 2019-08-05 DIAGNOSIS — E291 Testicular hypofunction: Secondary | ICD-10-CM | POA: Diagnosis not present

## 2019-08-05 DIAGNOSIS — Z9189 Other specified personal risk factors, not elsewhere classified: Secondary | ICD-10-CM

## 2019-08-05 DIAGNOSIS — Z1159 Encounter for screening for other viral diseases: Secondary | ICD-10-CM

## 2019-08-05 NOTE — Progress Notes (Signed)
I have discussed the procedure for the virtual visit with the patient who has given consent to proceed with assessment and treatment.   BP- having dizziness almost everyday in the morning. Lingering until about 11am. BP readings have been normal but with the dizziness it is dropping.   BS- 2:30 in the morning it was 136. Had not eaten after 7pm the night before. Afternoons are about 118.   SOB- with exertion at the gym. States that arms will not allow him to carry things without aching.   Davis Gourd, CMA

## 2019-08-05 NOTE — Progress Notes (Signed)
Virtual Visit via Video   I connected with patient on 08/05/19 at 10:30 AM EDT by a video enabled telemedicine application and verified that I am speaking with the correct person using two identifiers.  Location patient: Home Location provider: Acupuncturist, Office Persons participating in the virtual visit: Patient, Provider, [place] (Jess B)  I discussed the limitations of evaluation and management by telemedicine and the availability of in person appointments. The patient expressed understanding and agreed to proceed.  Subjective:   HPI:   Low T- ongoing issue for pt, following w/ Urology.  He has stopped his Testosterone injxns b/c at last check, level was high.  Diet controlled DM- due for foot exam, microalbumin, and eye exam.  'my sugar level runs high all the time.  It's always over 100'.  Fasting CBG 138.    SOB- pt reports 'my chest feels like it's cold outside'.  + family hx of CAD.  Pt is very concerned about heart disease.  Dizziness- 'the orthostatic situation with my blood pressure dropping every day'.  Pt reports this is now 'lingering throughout the morning'.  Is taking BP when he feels dizzy and notes that BP will drop from 127/80 --> 90s/60s.  ROS:   See pertinent positives and negatives per HPI.  Patient Active Problem List   Diagnosis Date Noted  . Chest pain 06/08/2015  . Hyperlipidemia 06/08/2015  . Generalized anxiety disorder 05/20/2015  . Transient vision disturbance of right eye 04/23/2015  . Diet-controlled diabetes mellitus ([place]) 11/04/2014  . Annual physical exam 09/04/2014  . Cervical disc disorder with radiculopathy of cervical region 08/29/2012  . 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 BPH (benign prostatic hyperplasia) 07/22/2008  . Back pain-chronic-status post surgery 07/22/2008  . GERD 11/12/2007  . Anxiety-   07/11/2007  . PERSISTENT DISORDER INITIATING/MAINTAINING SLEEP 05/30/2007  . 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 HERPES  SIMPLEX, UNCOMPLICATED 0000000  . IRRITABLE BOWEL SYNDROME 11/10/2006    Social History   Tobacco Use  . Smoking status: Never Smoker  . Smokeless tobacco: Never Used  Substance Use Topics  . Alcohol use: Yes    Alcohol/week: 0.0 standard drinks    Comment: Approximately once a month    Current Outpatient Medications:  .  Alpha-D-Galactosidase (BEANO) TABS, Take by mouth as needed., Disp: , Rfl:  .  ALPRAZolam (XANAX) 1 MG tablet, TAKE 1/2 TO 1 BY MOUTH ONCE DAILY AS NEEDED, Disp: 30 tablet, Rfl: 2 .  aspirin 81 MG tablet, Take 81 mg by mouth daily., Disp: , Rfl:  .  cyclobenzaprine (FLEXERIL) 10 MG tablet, Take 1 tablet (10 mg total) by mouth 3 (three) times daily as needed for muscle spasms., Disp: 30 tablet, Rfl: 0 .  dicyclomine (BENTYL) 10 MG capsule, Take 1 capsule (10 mg total) by mouth 3 (three) times daily before meals., Disp: 90 capsule, Rfl: 11 .  famotidine (PEPCID) 40 MG tablet, Take 1 tablet (40 mg total) by mouth at bedtime., Disp: 30 tablet, Rfl: 11 .  fenofibrate 160 MG tablet, TAKE ONE TABLET BY MOUTH DAILY, Disp: 90 tablet, Rfl: 1 .  fluticasone (FLONASE) 50 MCG/ACT nasal spray, Place 2 sprays into both nostrils daily., Disp: 16 g, Rfl: 6 .  Lecithin 1200 MG CAPS, Take by mouth daily., Disp: , Rfl:  .  Maca 500 MG CAPS, Take by mouth daily., Disp: , Rfl:  .  magnesium 30 MG tablet, Take 30 mg by mouth daily., Disp: , Rfl:  .  Needle, Disp, (HYPODERMIC  NEEDLE 23GX1") 23G X 1" MISC, Please use one needle each time testosterone is given., Disp: 25 each, Rfl: 1 .  NEEDLE, DISP, 18 G 18G X 1" MISC, Use one needle each time testosterone is drawn for injection., Disp: 25 each, Rfl: 12 .  OVER THE COUNTER MEDICATION, [employer] triple action joint, Disp: , Rfl:  .  pantoprazole (PROTONIX) 40 MG tablet, TAKE ONE TABLET BY MOUTH DAILY, Disp: 30 tablet, Rfl: 5 .  RABEprazole (ACIPHEX) 20 MG tablet, TAKE 1 TABLET BY MOUTH TWO TIMES A DAY, Disp: 60 tablet, Rfl: 5 .  SUPER B  COMPLEX/C PO, Take by mouth daily., Disp: , Rfl:  .  Syringe, Disposable, 3 ML MISC, Please use one syringe each time testosterone injection is given., Disp: 25 each, Rfl: 1 .  valACYclovir (VALTREX) 500 MG tablet, TAKE ONE TABLET BY MOUTH DAILY, Disp: 30 tablet, Rfl: 3 .  Vitamin D, Ergocalciferol, (DRISDOL) 1.25 MG (50000 UT) CAPS capsule, Take 1 capsule (50,000 Units total) by mouth every 7 (seven) days., Disp: 12 capsule, Rfl: 0 .  Zinc 50 MG CAPS, Take by mouth., Disp: , Rfl:  .  testosterone cypionate (DEPOTESTOSTERONE CYPIONATE) 200 MG/ML injection, Inject 1 mL (200 mg total) into the muscle every 14 (fourteen) days. (Patient not taking: Reported on 08/05/2019), Disp: 10 mL, Rfl: 3  Allergies  Allergen Reactions  . Erythromycin Diarrhea    abd cramps    Objective:   BP 138/80   Ht 5\' 7"  (1.702 m)   Wt 171 lb (77.6 kg)   BMI 26.78 kg/m   AAOx3, NAD NCAT, EOMI No obvious CN deficits Coloring WNL Pt is able to speak clearly, coherently without shortness of breath or increased work of breathing.  Thought process is linear.  Mood is appropriate.   Assessment and Plan:   Hypotension- pt reports that he will have dizzy episodes each morning that are now lasting longer (til 11:00am at times) and are more bothersome.  He states that when he checks his BP during these episodes it is typically low.  He states he is drinking plenty of water.  + family hx of CAD.  Will refer to Cards for complete evaluation/treatment  Diet controlled DM- ongoing issue for pt.  His A1Cs have hovered around 6.5 w/o medication.  His home CBGs are all well controlled for someone with dx of DM but he wants them in the normal range.  Will continue to follow.  Low Testosterone- ongoing issue for pt.  He stopped his injxns 3 months ago but is again having muscle fatigue and weakness.  Will repeat labs and if levels are again low will refer him to Endocrinology.  Pt expressed understanding and is in agreement w/  plan.   Chest tightness/short winded- pt reports his chest 'always feels like it's cold'.  Given + family hx of CAD, will refer to cards for complete work up.  If cardiac w/u is normal, will pursue pulmonary workup.  Pt expressed understanding and is in agreement w/ plan.    Annye Asa, MD 08/05/2019

## 2019-08-06 ENCOUNTER — Other Ambulatory Visit (INDEPENDENT_AMBULATORY_CARE_PROVIDER_SITE_OTHER): Payer: Managed Care, Other (non HMO)

## 2019-08-06 ENCOUNTER — Other Ambulatory Visit: Payer: Self-pay

## 2019-08-06 DIAGNOSIS — E291 Testicular hypofunction: Secondary | ICD-10-CM

## 2019-08-06 DIAGNOSIS — Z1159 Encounter for screening for other viral diseases: Secondary | ICD-10-CM

## 2019-08-06 DIAGNOSIS — E119 Type 2 diabetes mellitus without complications: Secondary | ICD-10-CM

## 2019-08-06 DIAGNOSIS — Z9189 Other specified personal risk factors, not elsewhere classified: Secondary | ICD-10-CM

## 2019-08-06 LAB — HEMOGLOBIN A1C: Hgb A1c MFr Bld: 6.7 % — ABNORMAL HIGH (ref 4.6–6.5)

## 2019-08-06 LAB — CBC WITH DIFFERENTIAL/PLATELET
Basophils Absolute: 0.1 10*3/uL (ref 0.0–0.1)
Basophils Relative: 1.4 % (ref 0.0–3.0)
Eosinophils Absolute: 0.1 10*3/uL (ref 0.0–0.7)
Eosinophils Relative: 1.3 % (ref 0.0–5.0)
HCT: 43.3 % (ref 39.0–52.0)
Hemoglobin: 14.6 g/dL (ref 13.0–17.0)
Lymphocytes Relative: 36.3 % (ref 12.0–46.0)
Lymphs Abs: 2.6 10*3/uL (ref 0.7–4.0)
MCHC: 33.6 g/dL (ref 30.0–36.0)
MCV: 93.2 fl (ref 78.0–100.0)
Monocytes Absolute: 0.5 10*3/uL (ref 0.1–1.0)
Monocytes Relative: 7.6 % (ref 3.0–12.0)
Neutro Abs: 3.8 10*3/uL (ref 1.4–7.7)
Neutrophils Relative %: 53.4 % (ref 43.0–77.0)
Platelets: 449 10*3/uL — ABNORMAL HIGH (ref 150.0–400.0)
RBC: 4.65 Mil/uL (ref 4.22–5.81)
RDW: 14.1 % (ref 11.5–15.5)
WBC: 7.1 10*3/uL (ref 4.0–10.5)

## 2019-08-06 LAB — BASIC METABOLIC PANEL
BUN: 24 mg/dL — ABNORMAL HIGH (ref 6–23)
CO2: 29 mEq/L (ref 19–32)
Calcium: 9.9 mg/dL (ref 8.4–10.5)
Chloride: 99 mEq/L (ref 96–112)
Creatinine, Ser: 1.43 mg/dL (ref 0.40–1.50)
GFR: 50.62 mL/min — ABNORMAL LOW (ref >60.00)
Glucose, Bld: 134 mg/dL — ABNORMAL HIGH (ref 70–99)
Potassium: 4.5 mEq/L (ref 3.5–5.1)
Sodium: 137 mEq/L (ref 135–145)

## 2019-08-06 LAB — HEPATIC FUNCTION PANEL
ALT: 21 U/L (ref 0–53)
AST: 20 U/L (ref 0–37)
Albumin: 4.6 g/dL (ref 3.5–5.2)
Alkaline Phosphatase: 36 U/L — ABNORMAL LOW (ref 39–117)
Bilirubin, Direct: 0.1 mg/dL (ref 0.0–0.3)
Total Bilirubin: 0.6 mg/dL (ref 0.2–1.2)
Total Protein: 7.1 g/dL (ref 6.0–8.3)

## 2019-08-06 LAB — MICROALBUMIN / CREATININE URINE RATIO
Creatinine,U: 87.4 mg/dL
Microalb Creat Ratio: 1.9 mg/g (ref 0.0–30.0)
Microalb, Ur: 1.7 mg/dL (ref 0.0–1.9)

## 2019-08-06 LAB — LIPID PANEL
Cholesterol: 227 mg/dL — ABNORMAL HIGH (ref 0–200)
HDL: 55 mg/dL (ref >39.00)
LDL Cholesterol: 148 mg/dL — ABNORMAL HIGH (ref 0–99)
NonHDL: 171.99
Total CHOL/HDL Ratio: 4
Triglycerides: 122 mg/dL (ref 0.0–149.0)
VLDL: 24.4 mg/dL (ref 0.0–40.0)

## 2019-08-06 LAB — TSH: TSH: 3.63 u[IU]/mL (ref 0.35–4.50)

## 2019-08-07 LAB — TESTOSTERONE TOTAL,FREE,BIO, MALES
Albumin: 4.2 g/dL (ref 3.6–5.1)
Sex Hormone Binding: 18 nmol/L — ABNORMAL LOW (ref 22–77)
Testosterone: 232 ng/dL — ABNORMAL LOW (ref 250–827)

## 2019-08-07 LAB — HEPATITIS C ANTIBODY
Hepatitis C Ab: NONREACTIVE
SIGNAL TO CUT-OFF: 0.01 (ref <1.00)

## 2019-08-08 LAB — BASIC METABOLIC PANEL
BUN: 24 — AB (ref 4–21)
CO2: 29 — AB (ref 13–22)
Chloride: 102 (ref 99–108)
Creatinine: 1.4 — AB (ref 0.6–1.3)
Glucose: 116
Potassium: 4.7 (ref 3.4–5.3)
Sodium: 140 (ref 137–147)

## 2019-08-08 LAB — COMPREHENSIVE METABOLIC PANEL
Calcium: 10.4 (ref 8.7–10.7)
GFR calc Af Amer: 65
GFR calc non Af Amer: 54

## 2019-08-12 ENCOUNTER — Ambulatory Visit (INDEPENDENT_AMBULATORY_CARE_PROVIDER_SITE_OTHER): Payer: Managed Care, Other (non HMO) | Admitting: Family Medicine

## 2019-08-12 ENCOUNTER — Other Ambulatory Visit: Payer: Self-pay

## 2019-08-12 ENCOUNTER — Other Ambulatory Visit: Payer: Self-pay | Admitting: General Practice

## 2019-08-12 ENCOUNTER — Encounter: Payer: Self-pay | Admitting: Family Medicine

## 2019-08-12 VITALS — BP 130/83 | HR 78 | Temp 97.9°F | Resp 16 | Ht 67.0 in | Wt 170.5 lb

## 2019-08-12 DIAGNOSIS — R0789 Other chest pain: Secondary | ICD-10-CM

## 2019-08-12 DIAGNOSIS — Z0001 Encounter for general adult medical examination with abnormal findings: Secondary | ICD-10-CM | POA: Diagnosis not present

## 2019-08-12 DIAGNOSIS — E119 Type 2 diabetes mellitus without complications: Secondary | ICD-10-CM

## 2019-08-12 DIAGNOSIS — Z0000 Encounter for general adult medical examination without abnormal findings: Secondary | ICD-10-CM

## 2019-08-12 DIAGNOSIS — E785 Hyperlipidemia, unspecified: Secondary | ICD-10-CM

## 2019-08-12 MED ORDER — ATORVASTATIN CALCIUM 20 MG PO TABS: 20.0000 mg | ORAL_TABLET | Freq: Every day | ORAL | 1 refills | Status: DC

## 2019-08-12 NOTE — Progress Notes (Signed)
   Subjective:    Patient ID: Jon Hipp., male    DOB: November 01, 1960, 59 y.o.   MRN: [id number]  HPI CPE- due for eye exam, foot exam.  UTD on colonoscopy.  Pt continues to have multiple concerns- hypotension in the AM (has cardiology appt pending), low T and DM (endo referral placed), arm fatigue, 'ache in my chest'.  Able to work out w/ a Clinical research associate for an hour at a time.   Review of Systems Patient reports no vision/hearing changes, anorexia, fever,adenopathy, persistant/recurrent hoarseness, swallowing issues, palpitations, edema, persistant/recurrent cough, hemoptysis, dyspnea (rest,exertional, paroxysmal nocturnal), gastrointestinal  bleeding (melena, rectal bleeding), abdominal pain, excessive heart burn, GU symptoms (dysuria, hematuria, voiding/incontinence issues) syncope, focal weakness, memory loss, skin/hair/nail changes, depression, anxiety, abnormal bruising/bleeding, musculoskeletal symptoms/signs.   + numbness of hands bilaterally  This visit occurred during the SARS-CoV-2 public health emergency.  Safety protocols were in place, including screening questions prior to the visit, additional usage of staff PPE, and extensive cleaning of exam room while observing appropriate contact time as indicated for disinfecting solutions.       Objective:   Physical Exam General Appearance:    Alert, cooperative, no distress, appears stated age  Head:    Normocephalic, without obvious abnormality, atraumatic  Eyes:    PERRL, conjunctiva/corneas clear, EOM's intact, fundi    benign, both eyes       Ears:    Normal TM's and external ear canals, both ears  Nose:   Deferred due to COVID  Throat:   Neck:   Supple, symmetrical, trachea midline, no adenopathy;       thyroid:  No enlargement/tenderness/nodules  Back:     Symmetric, no curvature, ROM normal, no CVA tenderness  Lungs:     Clear to auscultation bilaterally, respirations unlabored  Chest wall:    No tenderness or deformity   Heart:    Regular rate and rhythm, S1 and S2 normal, no murmur, rub   or gallop  Abdomen:     Soft, non-tender, bowel sounds active all four quadrants,    no masses, no organomegaly  Genitalia:    deferred  Rectal:    Extremities:   Extremities normal, atraumatic, no cyanosis or edema  Pulses:   2+ and symmetric all extremities  Skin:   Skin color, texture, turgor normal, no rashes or lesions  Lymph nodes:   Cervical, supraclavicular, and axillary nodes normal  Neurologic:   CNII-XII intact. Normal strength, sensation and reflexes      throughout          Assessment & Plan:  Chest tightness- EKG shows new RBBB.  Has upcoming appt w/ cards.

## 2019-08-12 NOTE — Patient Instructions (Addendum)
Schedule a lab only visit in 6-8 weeks to recheck liver functions Follow up in 3-4 months to recheck sugar We'll continue to try and get to the bottom of this Your EKG shows an incomplete right bundle branch block.  Typically we don't have to do anything about these, but since you're already going to see Dr Acie Fredrickson, he will do any necessary workup. Limit caffeine Do not over exert yourself until you see cardiology.  Exercise is fine, but not to extremes (heavy lifting, max'ing out HR during HIIT workouts, etc) START the Atorvastatin nightly to lower cholesterol and reduce cardiovascular risk Continue to drink plenty of water and increase your salt intake Change positions slowly to allow your blood pressure time to adjust Call with any questions or concerns Hang in there!

## 2019-08-13 ENCOUNTER — Encounter: Payer: Self-pay | Admitting: Family Medicine

## 2019-08-13 NOTE — Assessment & Plan Note (Signed)
Pt's PE WNL.  UTD on colonoscopy, immunizations.  Pt has ongoing list of concerns w/ multiple referrals pending.  His anxiety is very high given his many physical symptoms.  Reviewed recent labs.  Anticipatory guidance provided.

## 2019-08-13 NOTE — Assessment & Plan Note (Signed)
Ongoing issue for pt.  Foot exam done today.  UTD on microalbumin.  Due for eye exam.  Will continue to follow.

## 2019-08-15 ENCOUNTER — Encounter: Payer: Self-pay | Admitting: Family Medicine

## 2019-08-19 ENCOUNTER — Other Ambulatory Visit: Payer: Self-pay

## 2019-08-21 NOTE — Progress Notes (Signed)
Cardiology Office Note   Date:  08/22/2019   ID:  Jon Hipp., DOB 14-Feb-1961, MRN [id number]  PCP:  Midge Minium, MD  Cardiologist:   Clova Morlock [place], MD   Chief Complaint  Patient presents with  . Hypotension      History of Present Illness: Jon Trettel Nghiem Brooke Bonito. is a 59 y.o. male who is seen at the request of Dr Birdie Riddle for evaluation of orthostatic hypotension. He has a history of DM and HLD. He has a family history of CAD with mother having several stents. He was seen in the ED for evaluation of chest pain in 2018. troponins were normal. Subsequent ETT as outpatient was normal.   For the past 4-5 months he has experienced orthostatic hypotension. Documented multiple times by BP recordings. No syncope but feels lightheaded. Thought it was related to testosterone but has not improved with stopping this. Symptoms worse in the early morning but now lingering into the afternoon. He does work out regularly. Notes his arms get weak now with activity and he is getting more SOB when he works out. BS has been elevated. Seeing Dr Buddy Duty. A1c 6.7%. medication not yet prescribed. Notes difficulty taking statins in the past due to muscle weakness. Also notes having cold sweats at times- clammy.    Past Medical History:  Diagnosis Date  . Anxiety   . BCC (basal cell carcinoma of skin) 03-2013   scalp  . BPH (benign prostatic hyperplasia)   . Cervical pain (neck)   . Diverticulosis   . Fatty liver   . GERD   . Headache   . HERPES SIMPLEX, UNCOMPLICATED   . History of exercise stress test    ETT 3/18: Ex 10'02", normal BP response, no ST changes; neg adequate ETT  . Irritable bowel syndrome (IBS)    WITH DIARRHEA  . LUMBAR RADICULOPATHY, LEFT   . Melanoma in situ ([place]) 12/2014   On lateral right thigh, Dr. Delice Lesch  . Other and unspecified hyperlipidemia   . Other chronic sinusitis   . Other testicular hypofunction   . PANIC ATTACK, ACUTE     Past Surgical  History:  Procedure Laterality Date  . BACK SURGERY  2008  . birth mark removal     as baby  . COLONOSCOPY    . GANGLION CYST EXCISION     finger  . HERNIA REPAIR     Inguinal  . ORCHIECTOMY Right ~1995   benign  . SPINE SURGERY    . TONSILLECTOMY       Current Outpatient Medications  Medication Sig Dispense Refill  . Alpha-D-Galactosidase (BEANO) TABS Take by mouth as needed.    Marland Kitchen aspirin 81 MG tablet Take 81 mg by mouth daily.    . fenofibrate 160 MG tablet TAKE ONE TABLET BY MOUTH DAILY 90 tablet 1  . fluticasone (FLONASE) 50 MCG/ACT nasal spray Place 2 sprays into both nostrils daily. 16 g 6  . pantoprazole (PROTONIX) 40 MG tablet TAKE ONE TABLET BY MOUTH DAILY 30 tablet 5  . valACYclovir (VALTREX) 500 MG tablet TAKE ONE TABLET BY MOUTH DAILY 30 tablet 3  . Zinc 50 MG CAPS Take by mouth.    . dicyclomine (BENTYL) 10 MG capsule Take 1 capsule (10 mg total) by mouth 3 (three) times daily before meals. 90 capsule 11  . Needle, Disp, (HYPODERMIC NEEDLE 23GX1") 23G X 1" MISC Please use one needle each time testosterone is given. 25 each 1  . NEEDLE,  DISP, 18 G 18G X 1" MISC Use one needle each time testosterone is drawn for injection. 25 each 12  . RABEprazole (ACIPHEX) 20 MG tablet TAKE 1 TABLET BY MOUTH TWO TIMES A DAY 60 tablet 5  . Syringe, Disposable, 3 ML MISC Please use one syringe each time testosterone injection is given. 25 each 1   No current facility-administered medications for this visit.    Allergies:   Erythromycin    Social History:  The patient  reports that he has never smoked. He has never used smokeless tobacco. He reports current alcohol use. He reports that he does not use drugs.   Family History:  The patient's family history includes Cancer in his maternal uncle; Colon cancer in his paternal grandmother; Heart attack in his mother and paternal grandfather; Heart disease in his maternal grandmother and mother; Lung cancer in his father; Prostate cancer  in his father and paternal uncle.    ROS:  Please see the history of present illness.   Otherwise, review of systems are positive for none.   All other systems are reviewed and negative.    PHYSICAL EXAM: VS:  BP 135/86   Pulse 89   Temp (!) 97.1 F (36.2 C)   Ht 5\' 7"  (1.702 m)   Wt 169 lb 3.2 oz (76.7 kg)   SpO2 99%   BMI 26.50 kg/m  , BMI Body mass index is 26.5 kg/m. GEN: Well nourished, well developed, in no acute distress  HEENT: normal  Neck: no JVD, carotid bruits, or masses Cardiac: RRR; no murmurs, rubs, or gallops,no edema  Respiratory:  clear to auscultation bilaterally, normal work of breathing GI: soft, nontender, nondistended, + BS MS: no deformity or atrophy  Skin: warm and dry, no rash Neuro:  Strength and sensation are intact Psych: euthymic mood, full affect   EKG:  EKG is ordered today. The ekg ordered today demonstrates NSR rate 89 LAD. Otherwise normal. I have personally reviewed and interpreted this study.    Recent Labs: 08/06/2019: ALT 21; Hemoglobin 14.6; Platelets 449.0; TSH 3.63 08/08/2019: BUN 24; Creatinine 1.4; Potassium 4.7; Sodium 140    Lipid Panel    Component Value Date/Time   CHOL 227 (H) 08/06/2019 0731   TRIG 122.0 08/06/2019 0731   HDL 55.00 08/06/2019 0731   CHOLHDL 4 08/06/2019 0731   VLDL 24.4 08/06/2019 0731   LDLCALC 148 (H) 08/06/2019 0731   LDLCALC 92 03/21/2018 1541   LDLDIRECT 114.0 10/13/2016 0944      Wt Readings from Last 3 Encounters:  08/22/19 169 lb 3.2 oz (76.7 kg)  08/12/19 170 lb 8 oz (77.3 kg)  08/05/19 171 lb (77.6 kg)      Other studies Reviewed: Additional studies/ records that were reviewed today include:   ETT 06/30/16: Study Highlights    Blood pressure demonstrated a normal response to exercise.  There was no ST segment deviation noted during stress.   ETT with good exercise tolerance (10:02); no chest pain; normal BP response; no ST changes; negative adequate ETT; [facility] treadmill  score 10.   Stress Findings  ECG NSR, LAFB. .  Stress Findings The patient exercised following the Bruce protocol.   The patient reported no symptoms during the stress test. The patient experienced no angina during the stress test.   The patient requested the test to be stopped.   Blood pressure and heart rate demonstrated a normal response to exercise. Blood pressure demonstrated a normal response to exercise. Overall, the patient's  exercise capacity was excellent.   85% of maximum heart rate was achieved after 6.3 minutes.  Recovery time:  5 minutes.  The patient's response to exercise was adequate for diagnosis.  Response to Stress There was no ST segment deviation noted during stress.  Arrhythmias during stress:  none.   Arrhythmias during recovery:  none.     There were no significant arrhythmias noted during the test.   ECG was interpretable and there was no significant change from baseline.  Stress Measurements  Baseline Vitals  Rest HR 89 bpm    Rest BP 151/92 mmHg    Exercise Time  Exercise duration (min) 10 min    Exercise duration (sec) 2 sec    Peak Stress Vitals  Peak HR 169 BPM    Exercise Data  MPHR 165 bpm    Percent HR 102 %    RPE 15     Estimated workload 11.7 METS       Resulted by:  Signed Date/Time  Phone Pager  CRENSHAW, BRIAN S 06/30/2016         ASSESSMENT AND PLAN:  1.  Orthostatic hypotension. Discussed physiology of vascular reflexes. ? If related to elevated sugar. Recommend conservative measures initially since orthostatic drops are not severe. This includes wearing compression hose, maintaining good hydration, and not lying flat- keeping head of bed elevated. Will check Echo to make sure there are no structural heart issues. 2. Prediabetes/ ?DM- per endocrine 3. Hypercholesterolemia. Resistant to taking statin therapy. Will assess CV risk with coronary calcium score. This will help inform need for lipid lowering therapy 4. Dyspnea  on exertion and arm weakness. Check Echo and coronary calcium score.  5. CKD stage 3.    Current medicines are reviewed at length with the patient today.  The patient does not have concerns regarding medicines.  The following changes have been made:  no change  Labs/ tests ordered today include:   Orders Placed This Encounter  Procedures  . CT CARDIAC SCORING  . EKG 12-Lead  . ECHOCARDIOGRAM COMPLETE     Disposition:   FU with me after above tests.    Signed, Maille Halliwell [place], MD  08/22/2019 10:20 AM    [facility] [address], [place], [place], [address] Phone [phone], Fax [phone]

## 2019-08-22 ENCOUNTER — Ambulatory Visit: Payer: Managed Care, Other (non HMO) | Admitting: Cardiology

## 2019-08-22 ENCOUNTER — Encounter: Payer: Self-pay | Admitting: Cardiology

## 2019-08-22 ENCOUNTER — Other Ambulatory Visit: Payer: Self-pay

## 2019-08-22 VITALS — BP 135/86 | HR 89 | Temp 97.1°F | Ht 67.0 in | Wt 169.2 lb

## 2019-08-22 DIAGNOSIS — E786 Lipoprotein deficiency: Secondary | ICD-10-CM

## 2019-08-22 DIAGNOSIS — R0600 Dyspnea, unspecified: Secondary | ICD-10-CM

## 2019-08-22 DIAGNOSIS — R7303 Prediabetes: Secondary | ICD-10-CM | POA: Diagnosis not present

## 2019-08-22 DIAGNOSIS — I951 Orthostatic hypotension: Secondary | ICD-10-CM | POA: Diagnosis not present

## 2019-08-22 DIAGNOSIS — N1831 Chronic kidney disease, stage 3a: Secondary | ICD-10-CM

## 2019-08-22 DIAGNOSIS — R29898 Other symptoms and signs involving the musculoskeletal system: Secondary | ICD-10-CM

## 2019-08-22 DIAGNOSIS — R0602 Shortness of breath: Secondary | ICD-10-CM

## 2019-08-22 DIAGNOSIS — R0609 Other forms of dyspnea: Secondary | ICD-10-CM

## 2019-08-22 NOTE — Patient Instructions (Addendum)
Maintain good hydration  Wear compression hose  Try not to lie flat. Elevate head of bed. This helps to activate your vascular reflexes.  Schedule Echo  Schedule Calcium Score  Schedule follow up visit after test

## 2019-08-23 ENCOUNTER — Encounter: Payer: Self-pay | Admitting: Family Medicine

## 2019-08-23 ENCOUNTER — Other Ambulatory Visit: Payer: Self-pay | Admitting: Internal Medicine

## 2019-08-23 DIAGNOSIS — R7989 Other specified abnormal findings of blood chemistry: Secondary | ICD-10-CM

## 2019-08-29 ENCOUNTER — Ambulatory Visit
Admission: RE | Admit: 2019-08-29 | Discharge: 2019-08-29 | Disposition: A | Discharge status: Home / Self Care | Payer: Managed Care, Other (non HMO) | Source: Ambulatory Visit | Attending: Internal Medicine | Admitting: Internal Medicine

## 2019-08-29 ENCOUNTER — Other Ambulatory Visit: Payer: Self-pay

## 2019-08-29 DIAGNOSIS — R7989 Other specified abnormal findings of blood chemistry: Secondary | ICD-10-CM

## 2019-08-30 ENCOUNTER — Ambulatory Visit: Payer: Managed Care, Other (non HMO) | Admitting: Cardiovascular Disease

## 2019-09-05 ENCOUNTER — Ambulatory Visit (INDEPENDENT_AMBULATORY_CARE_PROVIDER_SITE_OTHER)
Admission: RE | Admit: 2019-09-05 | Discharge: 2019-09-05 | Disposition: A | Discharge status: Home / Self Care | Payer: Self-pay | Source: Ambulatory Visit | Attending: Cardiology | Admitting: Cardiology

## 2019-09-05 ENCOUNTER — Other Ambulatory Visit: Payer: Self-pay

## 2019-09-05 ENCOUNTER — Ambulatory Visit (HOSPITAL_COMMUNITY)
Admission: RE | Admit: 2019-09-05 | Discharge: 2019-09-05 | Disposition: A | Discharge status: Home / Self Care | Payer: Managed Care, Other (non HMO) | Source: Ambulatory Visit | Attending: Cardiology | Admitting: Cardiology

## 2019-09-05 DIAGNOSIS — E786 Lipoprotein deficiency: Secondary | ICD-10-CM

## 2019-09-05 DIAGNOSIS — N1831 Chronic kidney disease, stage 3a: Secondary | ICD-10-CM | POA: Diagnosis not present

## 2019-09-05 DIAGNOSIS — R0609 Other forms of dyspnea: Secondary | ICD-10-CM

## 2019-09-05 DIAGNOSIS — R7303 Prediabetes: Secondary | ICD-10-CM

## 2019-09-05 DIAGNOSIS — R29898 Other symptoms and signs involving the musculoskeletal system: Secondary | ICD-10-CM | POA: Diagnosis not present

## 2019-09-05 DIAGNOSIS — R0602 Shortness of breath: Secondary | ICD-10-CM

## 2019-09-05 DIAGNOSIS — R0600 Dyspnea, unspecified: Secondary | ICD-10-CM

## 2019-09-05 DIAGNOSIS — I951 Orthostatic hypotension: Secondary | ICD-10-CM | POA: Diagnosis not present

## 2019-09-05 NOTE — Progress Notes (Signed)
  Echocardiogram 2D Echocardiogram has been performed.  Jon Richardson 09/05/2019, 3:38 PM

## 2019-09-06 ENCOUNTER — Telehealth: Payer: Self-pay | Admitting: Cardiology

## 2019-09-06 NOTE — Telephone Encounter (Signed)
Statins are the most effective way to lower his cholesterol. His last LDL was 148 so to reach his goal he will need about a 55% reduction. We can try Zetia since it is a nonstatin but this will likely not get him to goal. We can try him on 10 mg daily and repeat lab in 3 months. He also needs a very heart healthy diet - primarily plant based.   Kavir Savoca [place] MD, [facility]

## 2019-09-06 NOTE — Telephone Encounter (Signed)
LMTCB

## 2019-09-06 NOTE — Telephone Encounter (Signed)
Called and spoke with pt, reviewed CT calcium score results. Reviewed Dr.Jordan's recommendations of beginning statin therapy again with crestor 20mg . Pt states that he had started statins for a while and stopped due to the arm pain he was having. He states his mother is unable to take statins at all and that she was put on some of the injectable therapy but was only able to take a few doses.   Pt states if Dr.Jordan is pushing for medications whether it is pill form or injectable (which pt would like to avoid) he wants the lowest dose with the least side effects possible.  Pt states there is a pill form of a medication that is not a "statin" and it starts with and "A" but could not remember the name.   Notified I would send this message to [place] for review. Pt verbalized understanding. Asked about echo results. Notified that they had not been reviewed yet and as soon as they were we would let him know. No other questions at this time.

## 2019-09-06 NOTE — Telephone Encounter (Signed)
Called and spoke with pt he states he remembers the medication we spoke of earlier and it was ezetimibe. Notified that Dr.Jordan had just sent a message in response to the conversation from earlier and that he aslo suggested zetia-- reviewed these recommendations with pt.  Martinique, Peter M, MD    Statins are the most effective way to lower his cholesterol. His last LDL was 148 so to reach his goal he will need about a 55% reduction. We can try Zetia since it is a nonstatin but this will likely not get him to goal. We can try him on 10 mg daily and repeat lab in 3 months. He also needs a very heart healthy diet - primarily plant based.   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 He states that with this he also has to drink 8 bottles of Dasani water daily and eat a lot of salt. He states that the diet may be difficult for him and would like to know if fish and chicken would be okay since he is having the surgery and has to keep his salt intake up. Notified I would clarify with Dr.Jordan but that I didn't see this as a problem if he ate this until his surgery.  He states he also needed to change his follow up appt with Dr.Jordan on 6/3 since he would be in the ICU after this surgery. Pt wanted to be seen sooner to discuss everything. Able to schedule pt for a VV with Dr.Jordan on 09/10/19 at 9:40 am Pt thankful for the call and verbalized understanding with instructions. Will send prescription for zetia to preferred pharmacy.

## 2019-09-06 NOTE — Telephone Encounter (Signed)
Patient returning Cheryl's call in regards to CT Cardiac results.

## 2019-09-06 NOTE — Telephone Encounter (Signed)
Patient states he is calling to follow up in regards to Dr. Doug Sou recommendations. Please call.

## 2019-09-07 NOTE — Progress Notes (Deleted)
{Choose 1 Note Type (Video or Telephone):[phone]}   The patient was identified using 2 identifiers.  Date:  09/07/2019   ID:  Jon Hipp., DOB [birth year], MRN [id number]  Patient Location: Home Provider Location: Home  PCP:  Midge Minium, MD  Cardiologist:  Lavenia Stumpo Martin[place] MD Electrophysiologist:  None   Evaluation Performed:  Follow-Up Visit  Chief Complaint:  CAD, orthostatic hypotension.  History of Present Illness:    Jon Certo Hermance Brooke Bonito. is a 59 y.o. male seen initially for evaluation of orthostatic hypotension. He has a history of DM and HLD. He has a family history of CAD with mother having several stents. He was seen in the ED for evaluation of chest pain in 2018. troponins were normal. Subsequent ETT as outpatient was normal.   For the past 4-5 months he has experienced orthostatic hypotension. Documented multiple times by BP recordings. No syncope but feels lightheaded. Thought it was related to testosterone but has not improved with stopping this. Symptoms worse in the early morning but now lingering into the afternoon. He does work out regularly. Notes his arms get weak now with activity and he is getting more SOB when he works out. BS has been elevated. Seeing Dr Buddy Duty. A1c 6.7%. medication not yet prescribed. Notes difficulty taking statins in the past due to muscle weakness. Also notes having cold sweats at times- clammy.  On initial visit conservative measures to manage orthostasis were discussed. Echo was ordered. Coronary CT calcium score was ordered to assess CV risk. CT calcium score was 115 placing him at 72nd percentile for age/sex. 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    The patient {does/does not:200015} have symptoms concerning for COVID-19 infection (fever, chills, cough, or new shortness of breath).    Past Medical History:  Diagnosis Date  . Anxiety   . BCC  (basal cell carcinoma of skin) 03-2013   scalp  . BPH (benign prostatic hyperplasia)   . Cervical pain (neck)   . Diverticulosis   . Fatty liver   . GERD   . Headache   . HERPES SIMPLEX, UNCOMPLICATED   . History of exercise stress test    ETT 3/18: Ex 10'02", normal BP response, no ST changes; neg adequate ETT  . Irritable bowel syndrome (IBS)    WITH DIARRHEA  . LUMBAR RADICULOPATHY, LEFT   . Melanoma in situ ([place]) 12/2014   On lateral right thigh, Dr. Delice Lesch  . Other and unspecified hyperlipidemia   . Other chronic sinusitis   . Other testicular hypofunction   . PANIC ATTACK, ACUTE    Past Surgical History:  Procedure Laterality Date  . BACK SURGERY  2008  . birth mark removal     as baby  . COLONOSCOPY    . GANGLION CYST EXCISION     finger  . HERNIA REPAIR     Inguinal  . ORCHIECTOMY Right ~1995   benign  . SPINE SURGERY    . TONSILLECTOMY       No outpatient medications have been marked as taking for the 09/10/19 encounter (Appointment) with Martin[place], Saher Davee M, MD.     Allergies:   Erythromycin   Social History   Tobacco Use  . Smoking status: Never Smoker  . Smokeless tobacco: Never Used  Substance Use Topics  . Alcohol use: Yes    Alcohol/week: 0.0 standard drinks    Comment: Approximately once a month  .  Drug use: No     Family Hx: The patient's family history includes Cancer in his maternal uncle; Colon cancer in his paternal grandmother; Heart attack in his mother and paternal grandfather; Heart disease in his maternal grandmother and mother; Lung cancer in his father; Prostate cancer in his father and paternal uncle. There is no history of Esophageal cancer, Rectal cancer, or Stomach cancer.  ROS:   Please see the history of present illness.    *** All other systems reviewed and are negative.   Prior CV studies:   The following studies were reviewed today:  ETT 06/30/16: Study Highlights    Blood pressure demonstrated a normal response to  exercise.  There was no ST segment deviation noted during stress.  ETT with good exercise tolerance (10:02); no chest pain; normal BP response; no ST changes; negative adequate ETT; [facility] treadmill score 10.   Stress Findings  ECG NSR, LAFB. .  Stress Findings The patient exercised following the Bruce protocol.  The patient reported no symptoms during the stress test. The patient experienced no angina during the stress test.   The patient requested the test to be stopped.   Blood pressure and heart rate demonstrated a normal response to exercise. Blood pressure demonstrated a normal response to exercise. Overall, the patient's exercise capacity was excellent.   85% of maximum heart rate was achieved after 6.3 minutes. Recovery time: 5 minutes. The patient's response to exercise was adequate for diagnosis.  Response to Stress There was no ST segment deviation noted during stress.  Arrhythmias during stress: none.  Arrhythmias during recovery: none.  There were no significant arrhythmias noted during the test.  ECG was interpretable and there was no significant change from baseline.  Stress Measurements     Baseline Vitals  Rest HR 89 bpm    Rest BP 151/92 mmHg    Exercise Time  Exercise duration (min) 10 min    Exercise duration (sec) 2 sec    Peak Stress Vitals  Peak HR 169 BPM    Exercise Data  MPHR 165 bpm    Percent HR 102 %    RPE 15     Estimated workload 11.7 METS       Resulted by:  Signed Date/Time  Phone Pager  CRENSHAW, BRIAN S 06/30/2016       Coronary Calcium Score  TECHNIQUE: The patient was scanned on a [employer] scanner. Axial non-contrast 3 mm slices were carried out through the heart. The data set was analyzed on a dedicated work station and scored using the [place].  FINDINGS: Non-cardiac: See separate report from [facility].  Ascending Aorta: Normal caliber.  Pericardium:  Normal.  Coronary arteries: Normal origins.  IMPRESSION: Coronary calcium score of 115. This was 72nd percentile for age and sex matched controls.  Eleonore Chiquito, MD   Electronically Signed   By: Eleonore Chiquito   On: 09/05/2019 12:23  CT ABDOMEN WITHOUT CONTRAST  TECHNIQUE: Multidetector CT imaging of the abdomen was performed following the standard protocol without IV contrast.  COMPARISON:  None.  FINDINGS: Lower chest: Lung bases are clear.  Hepatobiliary: Normal appearance of the liver and gallbladder.  Pancreas: Unremarkable. No pancreatic ductal dilatation or surrounding inflammatory changes.  Spleen: Normal in size without focal abnormality.  Adrenals/Urinary Tract: Solid left adrenal mass measures 4.8 x 2.8 x 3.6 cm. The Hounsfield units on this non contrast examination are 29. No inflammatory changes or hemorrhage around this left adrenal mass. Normal appearance  of the right adrenal gland. Normal appearance of both kidneys without stones or hydronephrosis. No suspicious renal lesions.  Stomach/Bowel: Normal appearance of the stomach and visualized bowel structures. No evidence for bowel obstruction or inflammatory changes.  Vascular/Lymphatic: Normal caliber of the abdominal aorta without aneurysm. Wall calcifications involving the right common iliac artery. Small lymph nodes in the central mesentery. Minimal stranding in the central mesentery is nonspecific could be postinflammatory.  Other: Negative for ascites.  Negative for free air.  Musculoskeletal: Disc space narrowing at L5-S1.  IMPRESSION: 1. Left adrenal mass measuring up to 4.8 cm. 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 Additional primary adrenal neoplasms cannot be excluded. Based on the size of this lesion, recommend surgical consultation for management or resection. 2. Mild stranding in the central abdominal  mesentery with small lymph nodes in this area. These findings are nonspecific but could be postinflammatory.   Electronically Signed   By: Markus Daft M.D.   On: 08/29/2019 16:49    Labs/Other Tests and Data Reviewed:    EKG:  {EKG/Telemetry Strips Reviewed:[phone]}  Recent Labs: 08/06/2019: ALT 21; Hemoglobin 14.6; Platelets 449.0; TSH 3.63 08/08/2019: BUN 24; Creatinine 1.4; Potassium 4.7; Sodium 140   Recent Lipid Panel Lab Results  Component Value Date/Time   CHOL 227 (H) 08/06/2019 07:31 AM   TRIG 122.0 08/06/2019 07:31 AM   HDL 55.00 08/06/2019 07:31 AM   CHOLHDL 4 08/06/2019 07:31 AM   LDLCALC 148 (H) 08/06/2019 07:31 AM   LDLCALC 92 03/21/2018 03:41 PM   LDLDIRECT 114.0 10/13/2016 09:44 AM    Wt Readings from Last 3 Encounters:  08/22/19 169 lb 3.2 oz (76.7 kg)  08/12/19 170 lb 8 oz (77.3 kg)  08/05/19 171 lb (77.6 kg)     Objective:    Vital Signs:  There were no vitals taken for this visit.   {[facility] Virtual Exam (Optional):[phone]::"VITAL SIGNS:  reviewed"}  ASSESSMENT & PLAN:    1.  Orthostatic hypotension. ? Related to hormonal imbalance with potential effects of left adrenal mass.  Recommend conservative measures initially since orthostatic drops are not severe. This includes wearing compression hose, maintaining good hydration, and not lying flat- keeping head of bed elevated. Will check Echo to make sure there are no structural heart issues. 2. Prediabetes/ ?DM- per endocrine 3. Hypercholesterolemia. Resistant to taking statin therapy. Will assess CV risk with coronary calcium score. This will help inform need for lipid lowering therapy 4. Dyspnea on exertion and arm weakness. Check Echo and coronary calcium score.  5. CKD stage 3. 6. Left adrenal mass. 4.8 cm.   COVID-19 Education: The signs and symptoms of COVID-19 were discussed with the patient and how to seek care for testing (follow up with PCP or arrange E-visit).  ***The  importance of social distancing was discussed today.  Time:   Today, I have spent *** minutes with the patient with telehealth technology discussing the above problems.     Medication Adjustments/Labs and Tests Ordered: Current medicines are reviewed at length with the patient today.  Concerns regarding medicines are outlined above.   Tests Ordered: No orders of the defined types were placed in this encounter.   Medication Changes: No orders of the defined types were placed in this encounter.   Follow Up:  {F/U Format:[phone]} {follow up:15908}  Signed, Shaeley Segall [place], MD  09/07/2019 5:10 PM    [facility]

## 2019-09-09 ENCOUNTER — Other Ambulatory Visit (HOSPITAL_COMMUNITY): Payer: Managed Care, Other (non HMO)

## 2019-09-09 ENCOUNTER — Inpatient Hospital Stay: Admission: RE | Admit: 2019-09-09 | Payer: Managed Care, Other (non HMO) | Source: Ambulatory Visit

## 2019-09-10 ENCOUNTER — Encounter: Payer: Self-pay | Admitting: Cardiology

## 2019-09-10 ENCOUNTER — Telehealth: Payer: Self-pay

## 2019-09-10 ENCOUNTER — Ambulatory Visit: Payer: Managed Care, Other (non HMO) | Admitting: Cardiology

## 2019-09-10 ENCOUNTER — Telehealth (INDEPENDENT_AMBULATORY_CARE_PROVIDER_SITE_OTHER): Payer: Managed Care, Other (non HMO) | Admitting: Cardiology

## 2019-09-10 VITALS — BP 145/79 | HR 68 | Ht 67.0 in | Wt 174.0 lb

## 2019-09-10 DIAGNOSIS — D3502 Benign neoplasm of left adrenal gland: Secondary | ICD-10-CM

## 2019-09-10 DIAGNOSIS — I951 Orthostatic hypotension: Secondary | ICD-10-CM | POA: Diagnosis not present

## 2019-09-10 DIAGNOSIS — R0600 Dyspnea, unspecified: Secondary | ICD-10-CM | POA: Diagnosis not present

## 2019-09-10 DIAGNOSIS — R931 Abnormal findings on diagnostic imaging of heart and coronary circulation: Secondary | ICD-10-CM | POA: Diagnosis not present

## 2019-09-10 DIAGNOSIS — R0609 Other forms of dyspnea: Secondary | ICD-10-CM

## 2019-09-10 MED ORDER — EZETIMIBE 10 MG PO TABS
10.0000 mg | ORAL_TABLET | Freq: Every day | ORAL | 3 refills | Status: DC
Start: 2019-09-10 — End: 2020-09-02

## 2019-09-10 NOTE — Telephone Encounter (Signed)
  Patient Consent for Virtual Visit         Jon Richardson. has provided verbal consent on 09/10/2019 for a virtual visit (video or telephone).   CONSENT FOR VIRTUAL VISIT FOR:  Jon Richardson.  By participating in this virtual visit I agree to the following:  I hereby voluntarily request, consent and authorize [facility] and its employed or contracted physicians, physician assistants, nurse practitioners or other licensed health care professionals (the Practitioner), to provide me with telemedicine health care services (the "Services") as deemed necessary by the treating Practitioner. I acknowledge and consent to receive the Services by the Practitioner via telemedicine. I understand that the telemedicine visit will involve communicating with the Practitioner through live audiovisual communication technology and the disclosure of certain medical information by electronic transmission. I acknowledge that I have been given the opportunity to request an in-person assessment or other available alternative prior to the telemedicine visit and am voluntarily participating in the telemedicine visit.  I understand that I have the right to withhold or withdraw my consent to the use of telemedicine in the course of my care at any time, without affecting my right to future care or treatment, and that the Practitioner or I may terminate the telemedicine visit at any time. I understand that I have the right to inspect all information obtained and/or recorded in the course of the telemedicine visit and may receive copies of available information for a reasonable fee.  I understand that some of the potential risks of receiving the Services via telemedicine include:  Marland Kitchen Delay or interruption in medical evaluation due to technological equipment failure or disruption; . Information transmitted may not be sufficient (e.g. poor resolution of images) to allow for appropriate medical decision making  by the Practitioner; and/or  . In rare instances, security protocols could fail, causing a breach of personal health information.  Furthermore, I acknowledge that it is my responsibility to provide information about my medical history, conditions and care that is complete and accurate to the best of my ability. I acknowledge that Practitioner's advice, recommendations, and/or decision may be based on factors not within their control, such as incomplete or inaccurate data provided by me or distortions of diagnostic images or specimens that may result from electronic transmissions. I understand that the practice of medicine is not an exact science and that Practitioner makes no warranties or guarantees regarding treatment outcomes. I acknowledge that a copy of this consent can be made available to me via my patient portal ([facility]), or I can request a printed copy by calling the office of [facility].    I understand that my insurance will be billed for this visit.   I have read or had this consent read to me. . I understand the contents of this consent, which adequately explains the benefits and risks of the Services being provided via telemedicine.  . I have been provided ample opportunity to ask questions regarding this consent and the Services and have had my questions answered to my satisfaction. . I give my informed consent for the services to be provided through the use of telemedicine in my medical care

## 2019-09-10 NOTE — Patient Instructions (Signed)
Medication Instructions:  Continue same medications *If you need a refill on your cardiac medications before your next appointment, please call your pharmacy*   Lab Work: None orderd    Testing/Procedures: None ordered   Follow-Up: At [facility], you and your health needs are our priority.  As part of our continuing mission to provide you with exceptional heart care, we have created designated Provider Care Teams.  These Care Teams include your primary Cardiologist (physician) and Advanced Practice Providers (APPs -  Physician Assistants and Nurse Practitioners) who all work together to provide you with the care you need, when you need it.  We recommend signing up for the patient portal called "MyChart".  Sign up information is provided on this After Visit Summary.  MyChart is used to connect with patients for Virtual Visits (Telemedicine).  Patients are able to view lab/test results, encounter notes, upcoming appointments, etc.  Non-urgent messages can be sent to your provider as well.   To learn more about what you can do with MyChart, go to [email or web address].      Your next appointment:  6 months     Call in July to schedule Nov appointment    The format for your next appointment: Office    Provider:  Dr.Jordan

## 2019-09-10 NOTE — Progress Notes (Signed)
Virtual Visit via Video Note   This visit type was conducted due to national recommendations for restrictions regarding the COVID-19 Pandemic (e.g. social distancing) in an effort to limit this patient's exposure and mitigate transmission in our community.  Due to his co-morbid illnesses, this patient is at least at moderate risk for complications without adequate follow up.  This format is felt to be most appropriate for this patient at this time.  All issues noted in this document were discussed and addressed.  A limited physical exam was performed with this format.  Please refer to the patient's chart for his consent to telehealth for [facility].   The patient was identified using 2 identifiers.  Date:  09/10/2019   ID:  Jon Hipp., DOB [birth year], MRN [id number]  Patient Location: Home Provider Location: Home  PCP:  Midge Minium, MD  Cardiologist:  Peter Martinique MD Electrophysiologist:  None   Evaluation Performed:  Follow-Up Visit  Chief Complaint:  CAD, orthostatic hypotension.  History of Present Illness:    Jon Wach Lueth Brooke Bonito. is a 59 y.o. male seen initially for evaluation of orthostatic hypotension. He has a history of DM and HLD. He has a family history of CAD with mother having several stents. He was seen in the ED for evaluation of chest pain in 2018. troponins were normal. Subsequent ETT as outpatient was normal.   For the past 4-5 months he has experienced orthostatic hypotension. Documented multiple times by BP recordings. No syncope but feels lightheaded. Thought it was related to testosterone but has not improved with stopping this. Symptoms worse in the early morning but now lingering into the afternoon. He does work out regularly. Notes his arms get weak now with activity and he is getting more SOB when he works out. BS has been elevated. Seeing Dr Buddy Duty. A1c 6.7%. medication not yet prescribed. Notes difficulty taking statins in the past  due to muscle weakness. Also notes having cold sweats at times- clammy.  On initial visit conservative measures to manage orthostasis were discussed. Echo was ordered. Coronary CT calcium score was ordered to assess CV risk. CT calcium score was 115 placing him at 72nd percentile for age/sex. 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 He is now seeing a Psychologist, sport and exercise in [place] and is planning to surgery next week. He is on alpha and beta blockade.  The patient does not have symptoms concerning for COVID-19 infection (fever, chills, cough, or new shortness of breath).    Past Medical History:  Diagnosis Date  . Anxiety   . BCC (basal cell carcinoma of skin) 03-2013   scalp  . BPH (benign prostatic hyperplasia)   . Cervical pain (neck)   . Diverticulosis   . Fatty liver   . GERD   . Headache   . HERPES SIMPLEX, UNCOMPLICATED   . History of exercise stress test    ETT 3/18: Ex 10'02", normal BP response, no ST changes; neg adequate ETT  . Irritable bowel syndrome (IBS)    WITH DIARRHEA  . LUMBAR RADICULOPATHY, LEFT   . Melanoma in situ ([place]) 12/2014   On lateral right thigh, Dr. Delice Lesch  . Other and unspecified hyperlipidemia   . Other chronic sinusitis   . Other testicular hypofunction   . PANIC ATTACK, ACUTE    Past Surgical History:  Procedure Laterality Date  . BACK SURGERY  2008  . birth mark removal  as baby  . COLONOSCOPY    . GANGLION CYST EXCISION     finger  . HERNIA REPAIR     Inguinal  . ORCHIECTOMY Right ~1995   benign  . SPINE SURGERY    . TONSILLECTOMY       Current Meds  Medication Sig  . Alpha-D-Galactosidase (BEANO) TABS Take by mouth as needed.  Marland Kitchen aspirin 81 MG tablet Take 81 mg by mouth daily.  . fenofibrate 160 MG tablet TAKE ONE TABLET BY MOUTH DAILY  . fluticasone (FLONASE) 50 MCG/ACT nasal spray Place 2 sprays into both nostrils daily.  . pantoprazole (PROTONIX) 40 MG tablet TAKE  ONE TABLET BY MOUTH DAILY  . phenoxybenzamine (DIBENZYLINE) 10 MG capsule Take 10 mg by mouth 3 (three) times daily.  . propranolol (INDERAL) 20 MG tablet Take 20 mg by mouth 3 (three) times daily.  . valACYclovir (VALTREX) 500 MG tablet TAKE ONE TABLET BY MOUTH DAILY     Allergies:   Erythromycin   Social History   Tobacco Use  . Smoking status: Never Smoker  . Smokeless tobacco: Never Used  Substance Use Topics  . Alcohol use: Yes    Alcohol/week: 0.0 standard drinks    Comment: Approximately once a month  . Drug use: No     Family Hx: The patient's family history includes Cancer in his maternal uncle; Colon cancer in his paternal grandmother; Heart attack in his mother and paternal grandfather; Heart disease in his maternal grandmother and mother; Lung cancer in his father; Prostate cancer in his father and paternal uncle. There is no history of Esophageal cancer, Rectal cancer, or Stomach cancer.  ROS:   Please see the history of present illness.     All other systems reviewed and are negative.   Prior CV studies:   The following studies were reviewed today:  ETT 06/30/16: Study Highlights    Blood pressure demonstrated a normal response to exercise.  There was no ST segment deviation noted during stress.  ETT with good exercise tolerance (10:02); no chest pain; normal BP response; no ST changes; negative adequate ETT; Duke treadmill score 10.   Stress Findings  ECG NSR, LAFB. .  Stress Findings The patient exercised following the Bruce protocol.  The patient reported no symptoms during the stress test. The patient experienced no angina during the stress test.   The patient requested the test to be stopped.   Blood pressure and heart rate demonstrated a normal response to exercise. Blood pressure demonstrated a normal response to exercise. Overall, the patient's exercise capacity was excellent.   85% of maximum heart rate was achieved after 6.3 minutes.  Recovery time: 5 minutes. The patient's response to exercise was adequate for diagnosis.  Response to Stress There was no ST segment deviation noted during stress.  Arrhythmias during stress: none.  Arrhythmias during recovery: none.  There were no significant arrhythmias noted during the test.  ECG was interpretable and there was no significant change from baseline.  Stress Measurements     Baseline Vitals  Rest HR 89 bpm    Rest BP 151/92 mmHg    Exercise Time  Exercise duration (min) 10 min    Exercise duration (sec) 2 sec    Peak Stress Vitals  Peak HR 169 BPM    Exercise Data  MPHR 165 bpm    Percent HR 102 %    RPE 15     Estimated workload 11.7 METS       Resulted  by:  Signed Date/Time  Phone Pager  Jon Richardson, Jon Richardson 06/30/2016       Coronary Calcium Score  TECHNIQUE: The patient was scanned on a [employer] scanner. Axial non-contrast 3 mm slices were carried out through the heart. The data set was analyzed on a dedicated work station and scored using the [place].  FINDINGS: Non-cardiac: See separate report from [facility].  Ascending Aorta: Normal caliber.  Pericardium: Normal.  Coronary arteries: Normal origins.  IMPRESSION: Coronary calcium score of 115. This was 72nd percentile for age and sex matched controls.  Jon Chiquito, MD   Electronically Signed   By: Jon Richardson   On: 09/05/2019 12:23  CT ABDOMEN WITHOUT CONTRAST  TECHNIQUE: Multidetector CT imaging of the abdomen was performed following the standard protocol without IV contrast.  COMPARISON:  None.  FINDINGS: Lower chest: Lung bases are clear.  Hepatobiliary: Normal appearance of the liver and gallbladder.  Pancreas: Unremarkable. No pancreatic ductal dilatation or surrounding inflammatory changes.  Spleen: Normal in size without focal abnormality.  Adrenals/Urinary Tract: Solid left adrenal mass measures  4.8 x 2.8 x 3.6 cm. The Hounsfield units on this non contrast examination are 29. No inflammatory changes or hemorrhage around this left adrenal mass. Normal appearance of the right adrenal gland. Normal appearance of both kidneys without stones or hydronephrosis. No suspicious renal lesions.  Stomach/Bowel: Normal appearance of the stomach and visualized bowel structures. No evidence for bowel obstruction or inflammatory changes.  Vascular/Lymphatic: Normal caliber of the abdominal aorta without aneurysm. Wall calcifications involving the right common iliac artery. Small lymph nodes in the central mesentery. Minimal stranding in the central mesentery is nonspecific could be postinflammatory.  Other: Negative for ascites.  Negative for free air.  Musculoskeletal: Disc space narrowing at L5-S1.  IMPRESSION: 1. Left adrenal mass measuring up to 4.8 cm. 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 Additional primary adrenal neoplasms cannot be excluded. Based on the size of this lesion, recommend surgical consultation for management or resection. 2. Mild stranding in the central abdominal mesentery with small lymph nodes in this area. These findings are nonspecific but could be postinflammatory.   Electronically Signed   By: Jon Richardson M.D.   On: 08/29/2019 16:49    Labs/Other Tests and Data Reviewed:    EKG:  No ECG reviewed.  Recent Labs: 08/06/2019: ALT 21; Hemoglobin 14.6; Platelets 449.0; TSH 3.63 08/08/2019: BUN 24; Creatinine 1.4; Potassium 4.7; Sodium 140   Recent Lipid Panel Lab Results  Component Value Date/Time   CHOL 227 (H) 08/06/2019 07:31 AM   TRIG 122.0 08/06/2019 07:31 AM   HDL 55.00 08/06/2019 07:31 AM   CHOLHDL 4 08/06/2019 07:31 AM   LDLCALC 148 (H) 08/06/2019 07:31 AM   LDLCALC 92 03/21/2018 03:41 PM   LDLDIRECT 114.0 10/13/2016 09:44 AM    Wt Readings from Last 3 Encounters:  09/10/19  174 lb (78.9 kg)  08/22/19 169 lb 3.2 oz (76.7 kg)  08/12/19 170 lb 8 oz (77.3 kg)     Objective:    Vital Signs:  BP (!) 145/79   Pulse 68   Ht 5\' 7"  (1.702 m)   Wt 174 lb (78.9 kg)   BMI 27.25 kg/m    VITAL SIGNS:  reviewed  General: WDWM in NAD Respirations unlabored.   ASSESSMENT & PLAN:    1.  Orthostatic hypotension. ? Related to hormonal imbalance with Pheochromocytoma.  I anticipate this will improve significantly after surgery.  2. Prediabetes/ ?DM- per endocrine 3. Hypercholesterolemia. Resistant to taking statin therapy. Given elevated coronary calcium score ideally would like to see LDL less than 70. On fenofibrate. Will add Zetia 10 mg daily. Recommend repeat lipids in 3-4 months. 4. Dyspnea on exertion Echo done 5/13- not yet reported. Will follow up results.  5. CKD stage 3. 6. 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 Plan surgery next week.  7. Coronary calcification. Focus on risk factor modification.  COVID-19 Education: The signs and symptoms of COVID-19 were discussed with the patient and how to seek care for testing (follow up with PCP or arrange E-visit).  The importance of social distancing was discussed today.  Time:   Today, I have spent 10 minutes with the patient with telehealth technology discussing the above problems.     Medication Adjustments/Labs and Tests Ordered: Current medicines are reviewed at length with the patient today.  Concerns regarding medicines are outlined above.   Tests Ordered: No orders of the defined types were placed in this encounter.   Medication Changes: No orders of the defined types were placed in this encounter.   Follow Up:  In Person in 6 month(s)  Signed, Peter Martinique, MD  09/10/2019 9:41 AM    [facility]

## 2019-09-15 ENCOUNTER — Emergency Department (HOSPITAL_BASED_OUTPATIENT_CLINIC_OR_DEPARTMENT_OTHER): Payer: Managed Care, Other (non HMO)

## 2019-09-15 ENCOUNTER — Other Ambulatory Visit: Payer: Self-pay

## 2019-09-15 ENCOUNTER — Encounter (HOSPITAL_BASED_OUTPATIENT_CLINIC_OR_DEPARTMENT_OTHER): Payer: Self-pay | Admitting: *Deleted

## 2019-09-15 ENCOUNTER — Emergency Department (HOSPITAL_BASED_OUTPATIENT_CLINIC_OR_DEPARTMENT_OTHER)
Admission: EM | Admit: 2019-09-15 | Discharge: 2019-09-15 | Disposition: A | Discharge status: Home / Self Care | Payer: Managed Care, Other (non HMO) | Attending: Emergency Medicine | Admitting: Emergency Medicine

## 2019-09-15 DIAGNOSIS — Z85828 Personal history of other malignant neoplasm of skin: Secondary | ICD-10-CM | POA: Insufficient documentation

## 2019-09-15 DIAGNOSIS — R079 Chest pain, unspecified: Secondary | ICD-10-CM | POA: Insufficient documentation

## 2019-09-15 DIAGNOSIS — E119 Type 2 diabetes mellitus without complications: Secondary | ICD-10-CM | POA: Insufficient documentation

## 2019-09-15 DIAGNOSIS — Z79899 Other long term (current) drug therapy: Secondary | ICD-10-CM | POA: Diagnosis not present

## 2019-09-15 MED ORDER — ALUM & MAG HYDROXIDE-SIMETH 200-200-20 MG/5ML PO SUSP
30.0000 mL | Freq: Once | ORAL | Status: AC
  Administered 2019-09-15: 30 mL via ORAL
  Filled 2019-09-15: qty 30

## 2019-09-15 MED ORDER — LIDOCAINE VISCOUS HCL 2 % MT SOLN
15.0000 mL | Freq: Once | OROMUCOSAL | Status: AC
  Administered 2019-09-15: 15 mL via ORAL
  Filled 2019-09-15: qty 15

## 2019-09-15 NOTE — ED Notes (Signed)
MD with pt  

## 2019-09-15 NOTE — ED Provider Notes (Signed)
[facility] EMERGENCY DEPARTMENT Provider Note  CSN: [id number] Arrival date & time: 09/15/19 [id number]  Chief Complaint(s) Chest Pain  HPI Jon Richardson. is a 59 y.o. male   The history is provided by the patient.  Chest Pain Pain location:  L chest and L lateral chest Pain quality: aching and stabbing   Pain radiates to:  Neck Pain severity:  Moderate Onset quality:  Gradual Duration: throughout the day today, got worse at 2a. Timing:  Constant Progression:  Waxing and waning (pain improved) Chronicity:  Recurrent (similar to prior anxiety or muscular pain or GERD) Relieved by: self resolve usually. Exacerbated by: movement of LUE or palpation of chest. Associated symptoms: anxiety   Associated symptoms: no cough, no fever, no nausea, no palpitations, no shortness of breath and no vomiting    Patient was concerned because of his BP at 180. Evaluated recently by Cardiology for same pain and had reassuring CT coronary.  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  Past Medical History Past Medical History:  Diagnosis Date  . Anxiety   . BCC (basal cell carcinoma of skin) 03-2013   scalp  . BPH (benign prostatic hyperplasia)   . Cervical pain (neck)   . Diverticulosis   . Fatty liver   . GERD   . Headache   . HERPES SIMPLEX, UNCOMPLICATED   . History of exercise stress test    ETT 3/18: Ex 10'02", normal BP response, no ST changes; neg adequate ETT  . Irritable bowel syndrome (IBS)    WITH DIARRHEA  . LUMBAR RADICULOPATHY, LEFT   . Melanoma in situ ([place]) 12/2014   On lateral right thigh, Dr. Delice Lesch  . Other and unspecified hyperlipidemia   . Other chronic sinusitis   . Other testicular hypofunction   . PANIC ATTACK, ACUTE    Patient Active Problem List   Diagnosis Date Noted  . Chest pain 06/08/2015  . Hyperlipidemia 06/08/2015  . Generalized anxiety disorder 05/20/2015  . Transient vision disturbance of right eye 04/23/2015  .  Diet-controlled diabetes mellitus ([place]) 11/04/2014  . Annual physical exam 09/04/2014  . Cervical disc disorder with radiculopathy of cervical region 08/29/2012  . 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 BPH (benign prostatic hyperplasia) 07/22/2008  . Back pain-chronic-status post surgery 07/22/2008  . GERD 11/12/2007  . Anxiety-   07/11/2007  . PERSISTENT DISORDER INITIATING/MAINTAINING SLEEP 05/30/2007  . 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 HERPES SIMPLEX, UNCOMPLICATED 0000000  . IRRITABLE BOWEL SYNDROME 11/10/2006   Home Medication(s) Prior to Admission medications   Medication Sig Start Date End Date Taking? Authorizing Provider  propranolol ER (INDERAL LA) 120 MG 24 hr capsule Take 120 mg by mouth daily.   Yes [provider]  Alpha-D-Galactosidase Satira Mccallum) TABS Take by mouth as needed.    [provider]  aspirin 81 MG tablet Take 81 mg by mouth daily.    [provider]  dicyclomine (BENTYL) 10 MG capsule Take 1 capsule (10 mg total) by mouth 3 (three) times daily before meals. 02/20/18   Ladene Artist, MD  ezetimibe (ZETIA) 10 MG tablet Take 1 tablet (10 mg total) by mouth daily. 09/10/19 09/04/20  Martinique, Peter M, MD  fenofibrate 160 MG tablet TAKE ONE TABLET BY MOUTH DAILY 07/08/19   Midge Minium, MD  fluticasone [facility]) 50 MCG/ACT nasal spray Place 2 sprays into both nostrils daily. 12/01/15   Midge Minium, MD  Needle, Disp, (HYPODERMIC NEEDLE 23GX1") 23G X 1" MISC Please  use one needle each time testosterone is given. 09/10/15   Midge Minium, MD  NEEDLE, DISP, 18 G 18G X 1" MISC Use one needle each time testosterone is drawn for injection. 09/15/15   Midge Minium, MD  pantoprazole (PROTONIX) 40 MG tablet TAKE ONE TABLET BY MOUTH DAILY 10/19/17   Midge Minium, MD  phenoxybenzamine (DIBENZYLINE) 10 MG capsule Take 10 mg by mouth 3 (three) times daily.    [provider]  propranolol (INDERAL) 20 MG tablet  Take 20 mg by mouth 3 (three) times daily.    [provider]  RABEprazole (ACIPHEX) 20 MG tablet TAKE 1 TABLET BY MOUTH TWO TIMES A DAY 07/08/19   Midge Minium, MD  Syringe, Disposable, 3 ML MISC Please use one syringe each time testosterone injection is given. 09/10/15   Midge Minium, MD  valACYclovir (VALTREX) 500 MG tablet TAKE ONE TABLET BY MOUTH DAILY 06/05/19   Midge Minium, MD  Zinc 50 MG CAPS Take by mouth.    [provider]                                                                                                                                    Past Surgical History Past Surgical History:  Procedure Laterality Date  . BACK SURGERY  2008  . birth mark removal     as baby  . COLONOSCOPY    . GANGLION CYST EXCISION     finger  . HERNIA REPAIR     Inguinal  . ORCHIECTOMY Right ~1995   benign  . SPINE SURGERY    . TONSILLECTOMY     Family History Family History  Problem Relation Age of Onset  . Heart disease Mother   . Heart attack Mother   . Prostate cancer Father   . Lung cancer Father   . Colon cancer Paternal Grandmother   . Heart attack Paternal Grandfather   . Heart disease Maternal Grandmother   . Prostate cancer Paternal Uncle   . Cancer Maternal Uncle        prostate cancer  . Esophageal cancer Neg Hx   . Rectal cancer Neg Hx   . Stomach cancer Neg Hx     Social History Social History   Tobacco Use  . Smoking status: Never Smoker  . Smokeless tobacco: Never Used  Substance Use Topics  . Alcohol use: Yes    Alcohol/week: 0.0 standard drinks    Comment: Approximately once a month  . Drug use: No   Allergies Erythromycin  Review of Systems Review of Systems  Constitutional: Negative for fever.  Respiratory: Negative for cough and shortness of breath.   Cardiovascular: Positive for chest pain. Negative for palpitations.  Gastrointestinal: Negative for nausea and vomiting.   All other systems are  reviewed and are negative for acute change except as noted in the HPI  Physical Exam Vital Signs  I have reviewed the triage vital signs BP (!) 164/101 (BP Location: Right Arm)   Pulse 79   Temp 98.7 F (37.1 C) (Oral)   Resp 18   Ht 5\' 7"  (1.702 m)   Wt 78.9 kg   SpO2 96%   BMI 27.25 kg/m   Physical Exam Vitals reviewed.  Constitutional:      General: He is not in acute distress.    Appearance: He is well-developed. He is not diaphoretic.  HENT:     Head: Normocephalic and atraumatic.     Nose: Nose normal.  Eyes:     General: No scleral icterus.       Right eye: No discharge.        Left eye: No discharge.     Conjunctiva/sclera: Conjunctivae normal.     Pupils: Pupils are equal, round, and reactive to light.  Cardiovascular:     Rate and Rhythm: Normal rate and regular rhythm.     Heart sounds: No murmur. No friction rub. No gallop.   Pulmonary:     Effort: Pulmonary effort is normal. No respiratory distress.     Breath sounds: Normal breath sounds. No stridor. No rales.    Chest:     Chest wall: Tenderness present.    Abdominal:     General: There is no distension.     Palpations: Abdomen is soft.     Tenderness: There is no abdominal tenderness.  Musculoskeletal:        General: No tenderness.     Cervical back: Normal range of motion and neck supple.  Skin:    General: Skin is warm and dry.     Findings: No erythema or rash.  Neurological:     Mental Status: He is alert and oriented to person, place, and time.     ED Results and Treatments Labs (all labs ordered are listed, but only abnormal results are displayed) Labs Reviewed - No data to display                                                                                                                       EKG  EKG Interpretation  Date/Time:  Sunday Sep 15 2019 03:40:19 EDT Ventricular Rate:  79 PR Interval:    QRS Duration: 96 QT Interval:  392 QTC Calculation: 450 R  Axis:   -73 Text Interpretation: Sinus rhythm Left anterior fascicular block Abnormal R-wave progression, late transition No significant change since last tracing Confirmed by Addison Lank [phone]) on 09/15/2019 4:26:43 AM      Radiology DG Chest 2 View  Result Date: 09/15/2019 CLINICAL DATA:  In it evaluation for acute chest pain. EXAM: CHEST - 2 VIEW COMPARISON:  Prior radiograph from 06/20/2016. FINDINGS: The cardiac and mediastinal silhouettes are stable in size and contour, and remain within normal limits. The lungs are normally inflated. Minimal left basilar subsegmental atelectasis. No airspace consolidation, pleural effusion, or pulmonary edema. No pneumothorax. No acute osseous abnormality. IMPRESSION: 1.  Minimal left basilar subsegmental atelectasis. 2. No other active cardiopulmonary disease. Electronically Signed   By: Jeannine Boga M.D.   On: 09/15/2019 04:58    Pertinent labs & imaging results that were available during my care of the patient were reviewed by me and considered in my medical decision making (see chart for details).  Medications Ordered in ED Medications  alum & mag hydroxide-simeth (MAALOX/MYLANTA) 200-200-20 MG/5ML suspension 30 mL (30 mLs Oral Given 09/15/19 0414)    And  lidocaine (XYLOCAINE) 2 % viscous mouth solution 15 mL (15 mLs Oral Given 09/15/19 0414)                                                                                                                                    Procedures Procedures  (including critical care time)  Medical Decision Making / ED Course I have reviewed the nursing notes for this encounter and the patient's prior records (if available in EHR or on provided paperwork).   Lala Lund Silvio Pate. was evaluated in Emergency Department on 09/15/2019 for the symptoms described in the history of present illness. He was evaluated in the context of the global COVID-19 pandemic, which necessitated consideration that the  patient might be at risk for infection with the SARS-CoV-2 virus that causes COVID-19. Institutional protocols and algorithms that pertain to the evaluation of patients at risk for COVID-19 are in a state of rapid change based on information released by regulatory bodies including the CDC and federal and state organizations. These policies and algorithms were followed during the patient's care in the ED.  Atypical chest pain similar to prior episodes consistent with muscular pain vs anxiety vs GERD. EKG without acute ischemic changes or evidence of pericarditis. Chest x-ray without evidence suggestive of pneumonia, pneumothorax, pneumomediastinum.  No abnormal contour of the mediastinum to suggest dissection. No evidence of acute injuries.  Low suspicion for ACS or cardiac etiology.  Discussed diagnostic options with patient to assess for ACS but with shared decision making patient opted for no blood work stating that he is confident that this is similar to prior episodes that were noncardiac.  Low suspicion for pulmonary embolism.  Presentation not classic for aortic dissection or esophageal perforation.  Pain resolved after GI cocktail.  No abd tenderness concerning for serious intra-abdominal inflammatory/infectious process.      Final Clinical Impression(s) / ED Diagnoses Final diagnoses:  Chest pain    The patient appears reasonably screened and/or stabilized for discharge and I doubt any other medical condition or other [facility] requiring further screening, evaluation, or treatment in the ED at this time prior to discharge. Safe for discharge with strict return precautions.  Disposition: Discharge  Condition: Good  I have discussed the results, Dx and Tx plan with the patient/family who expressed understanding and agree(s) with the plan. Discharge instructions discussed at length. The patient/family was given strict return precautions who verbalized understanding of the  instructions. No  further questions at time of discharge.    ED Discharge Orders    None        Follow Up: Midge Minium, MD [address] [place] [address] [phone]  Schedule an appointment as soon as possible for a visit  As needed     This chart was dictated using voice recognition software.  Despite best efforts to proofread,  errors can occur which can change the documentation meaning.   Fatima Blank, MD 09/15/19 [phone]

## 2019-09-15 NOTE — ED Notes (Signed)
Returned from xray

## 2019-09-15 NOTE — ED Notes (Signed)
To x-ray

## 2019-09-15 NOTE — ED Notes (Signed)
Up to bathroom. Tolerated well.

## 2019-09-15 NOTE — ED Triage Notes (Addendum)
C/o anterior chest pain/left neck pain. States pain is with movement at times. States he had a "panic attack"/anxiey tonight and also states he is scheduled for surgery June 2nd for adrenalectomy.  Concerned that his b/p has been elevated this evening and wants reassurance that chest pain is not cardiac.  Denies any sob, n/v. States he has a prn for anxiety but unsure if he could take it prior to surgery.

## 2019-09-17 ENCOUNTER — Other Ambulatory Visit: Payer: Managed Care, Other (non HMO)

## 2019-09-17 ENCOUNTER — Other Ambulatory Visit (HOSPITAL_COMMUNITY): Payer: Managed Care, Other (non HMO)

## 2019-09-24 ENCOUNTER — Telehealth: Payer: Self-pay

## 2019-09-24 ENCOUNTER — Other Ambulatory Visit: Payer: Managed Care, Other (non HMO)

## 2019-09-24 NOTE — Telephone Encounter (Signed)
Patient requested 5/18 echo report to be faxed to [facility].Echo report faxed to Dr.Kerr at fax # [phone].

## 2019-09-26 ENCOUNTER — Telehealth: Payer: Managed Care, Other (non HMO) | Admitting: Cardiology

## 2019-10-16 ENCOUNTER — Telehealth: Payer: Self-pay | Admitting: Genetic Counselor

## 2019-10-16 DIAGNOSIS — E896 Postprocedural adrenocortical (-medullary) hypofunction: Secondary | ICD-10-CM | POA: Insufficient documentation

## 2019-10-16 NOTE — Telephone Encounter (Signed)
Received a genetic counseling referral from Dr. Buddy Duty for Pheochromocytoma. Jon Richardson has been cld and scheduled to see Cari on 7/8 at 10am. Pt aware to arrive 15 minutes early.

## 2019-10-21 ENCOUNTER — Encounter: Payer: Self-pay | Admitting: Family Medicine

## 2019-10-30 ENCOUNTER — Ambulatory Visit: Payer: Managed Care, Other (non HMO) | Admitting: Family Medicine

## 2019-10-31 ENCOUNTER — Inpatient Hospital Stay: Payer: Managed Care, Other (non HMO) | Admitting: Genetic Counselor

## 2019-10-31 ENCOUNTER — Inpatient Hospital Stay: Payer: Managed Care, Other (non HMO)

## 2020-01-14 ENCOUNTER — Other Ambulatory Visit: Payer: Self-pay | Admitting: Family Medicine

## 2020-01-20 ENCOUNTER — Emergency Department (HOSPITAL_BASED_OUTPATIENT_CLINIC_OR_DEPARTMENT_OTHER)
Admission: EM | Admit: 2020-01-20 | Discharge: 2020-01-21 | Disposition: A | Discharge status: Home / Self Care | Payer: Managed Care, Other (non HMO) | Attending: Emergency Medicine | Admitting: Emergency Medicine

## 2020-01-20 ENCOUNTER — Encounter (HOSPITAL_BASED_OUTPATIENT_CLINIC_OR_DEPARTMENT_OTHER): Payer: Self-pay | Admitting: *Deleted

## 2020-01-20 ENCOUNTER — Other Ambulatory Visit: Payer: Self-pay | Admitting: Family Medicine

## 2020-01-20 ENCOUNTER — Other Ambulatory Visit: Payer: Self-pay

## 2020-01-20 ENCOUNTER — Emergency Department (HOSPITAL_COMMUNITY): Payer: Managed Care, Other (non HMO)

## 2020-01-20 DIAGNOSIS — E119 Type 2 diabetes mellitus without complications: Secondary | ICD-10-CM | POA: Insufficient documentation

## 2020-01-20 DIAGNOSIS — I861 Scrotal varices: Secondary | ICD-10-CM

## 2020-01-20 DIAGNOSIS — N453 Epididymo-orchitis: Secondary | ICD-10-CM

## 2020-01-20 DIAGNOSIS — Z85828 Personal history of other malignant neoplasm of skin: Secondary | ICD-10-CM | POA: Insufficient documentation

## 2020-01-20 DIAGNOSIS — Z79899 Other long term (current) drug therapy: Secondary | ICD-10-CM | POA: Diagnosis not present

## 2020-01-20 DIAGNOSIS — R1030 Lower abdominal pain, unspecified: Secondary | ICD-10-CM | POA: Diagnosis present

## 2020-01-20 DIAGNOSIS — N50812 Left testicular pain: Secondary | ICD-10-CM | POA: Insufficient documentation

## 2020-01-20 DIAGNOSIS — Z7982 Long term (current) use of aspirin: Secondary | ICD-10-CM | POA: Insufficient documentation

## 2020-01-20 DIAGNOSIS — N433 Hydrocele, unspecified: Secondary | ICD-10-CM

## 2020-01-20 LAB — URINALYSIS, ROUTINE W REFLEX MICROSCOPIC
Bilirubin Urine: NEGATIVE
Glucose, UA: NEGATIVE mg/dL
Hgb urine dipstick: NEGATIVE
Ketones, ur: NEGATIVE mg/dL
Leukocytes,Ua: NEGATIVE
Nitrite: NEGATIVE
Protein, ur: NEGATIVE mg/dL
Specific Gravity, Urine: 1.025 (ref 1.005–1.030)
pH: 5.5 (ref 5.0–8.0)

## 2020-01-20 MED ORDER — CEFTRIAXONE SODIUM 500 MG IJ SOLR
500.0000 mg | Freq: Once | INTRAMUSCULAR | Status: AC
  Administered 2020-01-21: 500 mg via INTRAMUSCULAR
  Filled 2020-01-20: qty 500

## 2020-01-20 MED ORDER — LEVOFLOXACIN 500 MG PO TABS
500.0000 mg | ORAL_TABLET | Freq: Once | ORAL | Status: AC
  Administered 2020-01-21: 500 mg via ORAL
  Filled 2020-01-20: qty 1

## 2020-01-20 NOTE — Discharge Instructions (Addendum)
Thank you for allowing me to care for you today in the Emergency Department.   You were seen today for testicular pain.  You had an ultrasound that suggested a mild developing infection called epididymoorchitis.  This is treated with antibiotics.  You were also found to have a small hydrocele and varicocele.   Take 1 tablet of levofloxacin daily for the next 10 days.  Your first dose was given tonight in the ER.  You were also given a dose of Rocephin.  Your urine has been sent for culture.   Take 650 mg of Tylenol or 600 mg of ibuprofen with food every 6 hours for pain.  You can alternate between these 2 medications every 3 hours if your pain returns.  For instance, you can take Tylenol at noon, followed by a dose of ibuprofen at 3, followed by second dose of Tylenol and 6.   You should return to the emergency department if you develop high fevers, severe redness and swelling to the testicle, severe abdominal pain, uncontrollable vomiting, rectal pain, or other new, concerning symptoms.  Please call to schedule a follow-up appointment with your urologist for recheck in the next 1 to 2 weeks.

## 2020-01-20 NOTE — ED Provider Notes (Signed)
59 year old male received as a transfer from Dr. Rex Kras from [place] for scrotal ultrasound.  Per her HPI  "Jon Richardson. is a 59 y.o. male.  59 year old male with past medical history below including hyperlipidemia, BPH, right orchiectomy who presents with lower abdominal and testicular pain.  Approximately 5 days ago, patient began having intermittent discomfort in his left suprapubic abdomen that he noticed mostly when he was walking around or bending over.  The pain began radiating down into his left testicle and he has noticed persistent left testicular pain.  He is not sure whether he has had swelling in this area.  He denies any associated nausea, vomiting, urinary symptoms, fevers, or recent illness.  The history is provided by the patient."   On my evaluation, patient is having minimal pain at this time.  He does note that he has been doing an extensive amount of work around his home and yard for the last 2 weeks, including building a brick wall, which required extensive lifting and moving of large cement blocks.   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  Reports that he had been on testosterone until approximately March or April.  Approximately 2 weeks ago he was started on Clomid.  He notes that when he was following up with general surgery for previous postoperative visit that he was told that he had a periumbilical hernia.  Physical Exam  BP (!) 148/88 (BP Location: Left Arm)   Pulse 78   Temp 98.5 F (36.9 C) (Oral)   Resp 20   Ht 5\' 7"  (1.702 m)   Wt 77.6 kg   SpO2 99%   BMI 26.78 kg/m   Physical Exam Vitals and nursing note reviewed.  Constitutional:      Appearance: He is well-developed.  HENT:     Head: Normocephalic.  Eyes:     Conjunctiva/sclera: Conjunctivae normal.  Cardiovascular:     Rate and Rhythm: Normal rate and regular rhythm.     Heart sounds: No murmur heard.   Pulmonary:      Effort: Pulmonary effort is normal. No respiratory distress.     Breath sounds: No stridor. No wheezing, rhonchi or rales.  Chest:     Chest wall: No tenderness.  Abdominal:     General: There is no distension.     Palpations: Abdomen is soft. There is no mass.     Tenderness: There is no abdominal tenderness. There is no right CVA tenderness, left CVA tenderness, guarding or rebound.     Hernia: No hernia is present.     Comments: Abdomen is soft, nontender, nondistended.  Genitourinary:    Comments: Deferred as patient is in a hallway bed. Musculoskeletal:     Cervical back: Neck supple.  Skin:    General: Skin is warm and dry.  Neurological:     Mental Status: He is alert.  Psychiatric:        Behavior: Behavior normal.     ED Course/Procedures     Procedures  MDM   59 year old male received as a transfer from [place] from Dr. Rex Kras pending scrotal ultrasound.  Please see her note for further work-up and medical decision making.  UA is unremarkable.  Scrotal ultrasound with slight heterogeneity of the left epididymis with prominent color Doppler flow to both the epididymis and the testis that could represent a mild epididymo orchitis.  There is also a small left  hydrocele and varicocele.  No evidence of pyocele.  He has a prior right orchiectomy.  Will send urine culture and GC chlamydia.  Will treat patient for epididymoorchitis with Levaquin and cover with Rocephin.  First dose given in the ER.  He is established with urology and will plan to follow-up within the next 1 to 2 weeks for recheck.  All questions answered.  He is hemodynamically stable and in no acute distress.  Safe for discharge home with outpatient follow-up as indicated.      Joanne Gavel, PA-C 17/12/78 [id number]    Delora Fuel, MD 67/25/50 [phone]

## 2020-01-20 NOTE — ED Triage Notes (Signed)
Pt c/o left testicular pain and swelling x 6 days

## 2020-01-20 NOTE — ED Notes (Signed)
ED Provider at bedside. 

## 2020-01-20 NOTE — ED Provider Notes (Signed)
[facility] Provider Note   CSN: [id number] Arrival date & time: 01/20/20  2051     History Chief Complaint  Patient presents with  . Testicle Pain    Jon Richardson. is a 59 y.o. male.  59 year old male with past medical history below including hyperlipidemia, BPH, right orchiectomy who presents with lower abdominal and testicular pain.  Approximately 5 days ago, patient began having intermittent discomfort in his left suprapubic abdomen that he noticed mostly when he was walking around or bending over.  The pain began radiating down into his left testicle and he has noticed persistent left testicular pain.  He is not sure whether he has had swelling in this area.  He denies any associated nausea, vomiting, urinary symptoms, fevers, or recent illness.  The history is provided by the patient.  Testicle Pain       Past Medical History:  Diagnosis Date  . Anxiety   . BCC (basal cell carcinoma of skin) 03-2013   scalp  . BPH (benign prostatic hyperplasia)   . Cervical pain (neck)   . Diverticulosis   . Fatty liver   . GERD   . Headache   . HERPES SIMPLEX, UNCOMPLICATED   . History of exercise stress test    ETT 3/18: Ex 10'02", normal BP response, no ST changes; neg adequate ETT  . Irritable bowel syndrome (IBS)    WITH DIARRHEA  . LUMBAR RADICULOPATHY, LEFT   . Melanoma in situ ([place]) 12/2014   On lateral right thigh, Dr. Delice Lesch  . Other and unspecified hyperlipidemia   . Other chronic sinusitis   . Other testicular hypofunction   . PANIC ATTACK, ACUTE     Patient Active Problem List   Diagnosis Date Noted  . Chest pain 06/08/2015  . Hyperlipidemia 06/08/2015  . Generalized anxiety disorder 05/20/2015  . Transient vision disturbance of right eye 04/23/2015  . Diet-controlled diabetes mellitus ([place]) 11/04/2014  . Annual physical exam 09/04/2014  . Cervical disc disorder with radiculopathy of cervical region 08/29/2012  .  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 BPH (benign prostatic hyperplasia) 07/22/2008  . Back pain-chronic-status post surgery 07/22/2008  . GERD 11/12/2007  . Anxiety-   07/11/2007  . PERSISTENT DISORDER INITIATING/MAINTAINING SLEEP 05/30/2007  . 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 HERPES SIMPLEX, UNCOMPLICATED 69/67/8938  . IRRITABLE BOWEL SYNDROME 11/10/2006    Past Surgical History:  Procedure Laterality Date  . BACK SURGERY  2008  . birth mark removal     as baby  . COLONOSCOPY    . GANGLION CYST EXCISION     finger  . HERNIA REPAIR     Inguinal  . ORCHIECTOMY Right ~1995   benign  . SPINE SURGERY    . TONSILLECTOMY         Family History  Problem Relation Age of Onset  . Heart disease Mother   . Heart attack Mother   . Prostate cancer Father   . Lung cancer Father   . Colon cancer Paternal Grandmother   . Heart attack Paternal Grandfather   . Heart disease Maternal Grandmother   . Prostate cancer Paternal Uncle   . Cancer Maternal Uncle        prostate cancer  . Esophageal cancer Neg Hx   . Rectal cancer Neg Hx   . Stomach cancer Neg Hx     Social History   Tobacco Use  . Smoking status: Never Smoker  . Smokeless tobacco: Never Used  Vaping Use  . Vaping Use: Never used  Substance Use Topics  . Alcohol use: Yes    Alcohol/week: 0.0 standard drinks    Comment: Approximately once a month  . Drug use: No    Home Medications Prior to Admission medications   Medication Sig Start Date End Date Taking? Authorizing Provider  Alpha-D-Galactosidase [facility]) TABS Take by mouth as needed.    [provider]  aspirin 81 MG tablet Take 81 mg by mouth daily.    [provider]  dicyclomine (BENTYL) 10 MG capsule Take 1 capsule (10 mg total) by mouth 3 (three) times daily before meals. 02/20/18   Ladene Artist, MD  ezetimibe (ZETIA) 10 MG tablet Take 1 tablet (10 mg total) by mouth daily. 09/10/19 09/04/20  Martinique, Peter M, MD  fenofibrate  160 MG tablet TAKE ONE TABLET BY MOUTH DAILY 01/20/20   Midge Minium, MD  fluticasone [facility]) 50 MCG/ACT nasal spray Place 2 sprays into both nostrils daily. 12/01/15   Midge Minium, MD  Needle, Disp, (HYPODERMIC NEEDLE 23GX1") 23G X 1" MISC Please use one needle each time testosterone is given. 09/10/15   Midge Minium, MD  NEEDLE, DISP, 18 G 18G X 1" MISC Use one needle each time testosterone is drawn for injection. 09/15/15   Midge Minium, MD  pantoprazole (PROTONIX) 40 MG tablet TAKE ONE TABLET BY MOUTH DAILY 10/19/17   Midge Minium, MD  phenoxybenzamine (DIBENZYLINE) 10 MG capsule Take 10 mg by mouth 3 (three) times daily.    [provider]  propranolol (INDERAL) 20 MG tablet Take 20 mg by mouth 3 (three) times daily.    [provider]  propranolol ER (INDERAL LA) 120 MG 24 hr capsule Take 120 mg by mouth daily.    [provider]  RABEprazole (ACIPHEX) 20 MG tablet TAKE 1 TABLET BY MOUTH TWO TIMES A DAY 07/08/19   Midge Minium, MD  Syringe, Disposable, 3 ML MISC Please use one syringe each time testosterone injection is given. 09/10/15   Midge Minium, MD  valACYclovir (VALTREX) 500 MG tablet TAKE ONE TABLET BY MOUTH DAILY 01/14/20   Midge Minium, MD  Zinc 50 MG CAPS Take by mouth.    [provider]    Allergies    Erythromycin  Review of Systems   Review of Systems  Genitourinary: Positive for testicular pain.   All other systems reviewed and are negative except that which was mentioned in HPI  Physical Exam Updated Vital Signs BP (!) 154/91 (BP Location: Left Arm)   Pulse 79   Temp 98.5 F (36.9 C) (Oral)   Resp 18   Ht 5\' 7"  (1.702 m)   Wt 77.6 kg   SpO2 98%   BMI 26.78 kg/m   Physical Exam Vitals and nursing note reviewed. Exam conducted with a chaperone present.  Constitutional:      General: He is not in acute distress.    Appearance: He is well-developed.  HENT:     Head:  Normocephalic and atraumatic.  Eyes:     Conjunctiva/sclera: Conjunctivae normal.  Pulmonary:     Effort: Pulmonary effort is normal.  Abdominal:     General: There is no distension.     Palpations: Abdomen is soft.     Tenderness: There is abdominal tenderness.     Comments: Very mild tenderness in LLQ just above surgical scar, no obvious palpable hernia  Genitourinary:    Penis: Normal.  Comments: R testicle surgically absent; L testicle mildly tender posteriorly without obvious swelling Musculoskeletal:     Cervical back: Neck supple.  Skin:    General: Skin is warm and dry.     Findings: No erythema.  Neurological:     Mental Status: He is alert and oriented to person, place, and time.     Gait: Gait normal.     Comments: Fluent speech  Psychiatric:        Judgment: Judgment normal.     ED Results / Procedures / Treatments   Labs (all labs ordered are listed, but only abnormal results are displayed) Labs Reviewed  URINALYSIS, ROUTINE W REFLEX MICROSCOPIC    EKG None  Radiology No results found.  Procedures Procedures (including critical care time) CRITICAL CARE Performed by: Wenda Overland Maceo Hernan   Total critical care time: 30 minutes  Critical care time was exclusive of separately billable procedures and treating other patients.  Critical care was necessary to treat or prevent imminent or life-threatening deterioration.  Critical care was time spent personally by me on the following activities: development of treatment plan with patient and/or surrogate as well as nursing, discussions with consultants, evaluation of patient's response to treatment, examination of patient, obtaining history from patient or surrogate, ordering and performing treatments and interventions, ordering and review of laboratory studies, ordering and review of radiographic studies, pulse oximetry and re-evaluation of patient's condition.  Medications Ordered in ED Medications - No  data to display  ED Course  I have reviewed the triage vital signs and the nursing notes.  Pertinent labs  that were available during my care of the patient were reviewed by me and considered in my medical decision making (see chart for details).    MDM Rules/Calculators/A&P                           Pt w/ single testicle p/w testicular pain. DDx includes hernia, epididymitis, or testicular torsion.  Given that testicular torsion is on the differential and patient has single testicle, obtaining emergent ultrasound is of utmost importance.  Unfortunately, ultrasound is not available at this facility at this current time therefore contacted [facility] and discussed ED to ED  Transfer w/ Dr. Darl Householder. Pt taken emergently by ambulance to [facility] for [place].  Final Clinical Impression(s) / ED Diagnoses Final diagnoses:  Pain in left testicle    Rx / DC Orders ED Discharge Orders    None       Eugune Sine, Wenda Overland, MD 01/20/20 2132

## 2020-01-21 MED ORDER — LEVOFLOXACIN 500 MG PO TABS: 500.0000 mg | ORAL_TABLET | Freq: Every day | ORAL | 0 refills | Status: AC

## 2020-01-21 MED ORDER — STERILE WATER FOR INJECTION IJ SOLN
INTRAMUSCULAR | Status: AC
  Filled 2020-01-21: qty 10

## 2020-01-21 NOTE — ED Notes (Signed)
Spoke with [facility] lab, GC/chlamydia to be added on to previous specimen.

## 2020-01-22 LAB — GC/CHLAMYDIA PROBE AMP (~~LOC~~) NOT AT ARMC
Chlamydia: NEGATIVE
Comment: NEGATIVE
Comment: NORMAL
Neisseria Gonorrhea: NEGATIVE

## 2020-02-14 ENCOUNTER — Encounter: Payer: Self-pay | Admitting: Family Medicine

## 2020-02-14 ENCOUNTER — Other Ambulatory Visit: Payer: Self-pay

## 2020-02-14 ENCOUNTER — Ambulatory Visit (INDEPENDENT_AMBULATORY_CARE_PROVIDER_SITE_OTHER): Payer: Managed Care, Other (non HMO) | Admitting: Family Medicine

## 2020-02-14 VITALS — BP 124/84 | HR 65 | Temp 98.4°F | Resp 16 | Ht 67.0 in | Wt 178.0 lb

## 2020-02-14 DIAGNOSIS — E119 Type 2 diabetes mellitus without complications: Secondary | ICD-10-CM

## 2020-02-14 DIAGNOSIS — Z86018 Personal history of other benign neoplasm: Secondary | ICD-10-CM | POA: Diagnosis not present

## 2020-02-14 DIAGNOSIS — N453 Epididymo-orchitis: Secondary | ICD-10-CM

## 2020-02-14 DIAGNOSIS — M791 Myalgia, unspecified site: Secondary | ICD-10-CM

## 2020-02-14 DIAGNOSIS — E7849 Other hyperlipidemia: Secondary | ICD-10-CM | POA: Diagnosis not present

## 2020-02-14 LAB — HEPATIC FUNCTION PANEL
ALT: 15 U/L (ref 0–53)
AST: 19 U/L (ref 0–37)
Albumin: 4.4 g/dL (ref 3.5–5.2)
Alkaline Phosphatase: 29 U/L — ABNORMAL LOW (ref 39–117)
Bilirubin, Direct: 0.1 mg/dL (ref 0.0–0.3)
Total Bilirubin: 0.5 mg/dL (ref 0.2–1.2)
Total Protein: 6.6 g/dL (ref 6.0–8.3)

## 2020-02-14 LAB — CBC WITH DIFFERENTIAL/PLATELET
Basophils Absolute: 0 10*3/uL (ref 0.0–0.1)
Basophils Relative: 0.6 % (ref 0.0–3.0)
Eosinophils Absolute: 0.2 10*3/uL (ref 0.0–0.7)
Eosinophils Relative: 2.9 % (ref 0.0–5.0)
HCT: 41.5 % (ref 39.0–52.0)
Hemoglobin: 14 g/dL (ref 13.0–17.0)
Lymphocytes Relative: 42.3 % (ref 12.0–46.0)
Lymphs Abs: 2.5 10*3/uL (ref 0.7–4.0)
MCHC: 33.8 g/dL (ref 30.0–36.0)
MCV: 90.7 fl (ref 78.0–100.0)
Monocytes Absolute: 0.5 10*3/uL (ref 0.1–1.0)
Monocytes Relative: 9.1 % (ref 3.0–12.0)
Neutro Abs: 2.6 10*3/uL (ref 1.4–7.7)
Neutrophils Relative %: 45.1 % (ref 43.0–77.0)
Platelets: 356 10*3/uL (ref 150.0–400.0)
RBC: 4.58 Mil/uL (ref 4.22–5.81)
RDW: 15.3 % (ref 11.5–15.5)
WBC: 5.8 10*3/uL (ref 4.0–10.5)

## 2020-02-14 LAB — POCT URINALYSIS DIPSTICK
Bilirubin, UA: NEGATIVE
Blood, UA: NEGATIVE
Glucose, UA: NEGATIVE
Ketones, UA: NEGATIVE
Leukocytes, UA: NEGATIVE
Nitrite, UA: NEGATIVE
Protein, UA: NEGATIVE
Spec Grav, UA: >=1.03 — AB (ref 1.010–1.025)
Urobilinogen, UA: 0.2 E.U./dL
pH, UA: 5.5 (ref 5.0–8.0)

## 2020-02-14 LAB — CK: Total CK: 80 U/L (ref 7–232)

## 2020-02-14 LAB — BASIC METABOLIC PANEL
BUN: 18 mg/dL (ref 6–23)
CO2: 32 mEq/L (ref 19–32)
Calcium: 9.8 mg/dL (ref 8.4–10.5)
Chloride: 102 mEq/L (ref 96–112)
Creatinine, Ser: 1.31 mg/dL (ref 0.40–1.50)
GFR: 59.58 mL/min — ABNORMAL LOW (ref >60.00)
Glucose, Bld: 85 mg/dL (ref 70–99)
Potassium: 4.6 mEq/L (ref 3.5–5.1)
Sodium: 139 mEq/L (ref 135–145)

## 2020-02-14 LAB — LIPID PANEL
Cholesterol: 140 mg/dL (ref 0–200)
HDL: 42 mg/dL (ref >39.00)
LDL Cholesterol: 69 mg/dL (ref 0–99)
NonHDL: 98.02
Total CHOL/HDL Ratio: 3
Triglycerides: 147 mg/dL (ref 0.0–149.0)
VLDL: 29.4 mg/dL (ref 0.0–40.0)

## 2020-02-14 LAB — HEMOGLOBIN A1C: Hgb A1c MFr Bld: 6.5 % (ref 4.6–6.5)

## 2020-02-14 LAB — TSH: TSH: 4.17 u[IU]/mL (ref 0.35–4.50)

## 2020-02-14 NOTE — Assessment & Plan Note (Signed)
New.  S/p L adrenalectomy in June.  Reports his constellation of sxs are much improved.  Is following w/ Dr Buddy Duty

## 2020-02-14 NOTE — Assessment & Plan Note (Signed)
Chronic problem.  He declined statin and is now on Zetia 10mg  daily along w/ Fenofibrate 160mg  daily.  Will check labs to see if lipids have improved

## 2020-02-14 NOTE — Progress Notes (Signed)
   Subjective:    Patient ID: Jon Richardson., male    DOB: [birth year], 59 y.o.   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  Pt reports he is feeling much better since surgery.  Following w/ Dr Buddy Duty ([facility])  Hyperlipidemia- chronic problem, cardiology also wanted him to start a statin but he declined.  Is now on Zetia 10mg  along w/ Fenofibate 160mg ..  Has not had recent labs.  No abd pain, N/V.  Myalgia- pt reports he is able to do everything he wants to do but he subsequently feels 'terrible'.  Reports feeling stiff and uncomfortable when sleeping.  Epididymo-orchitis- pt went to ER on 9/27.  He reports he continues to have discomfort on L side.  Concerned for possible recurrent or smoldering infxn.  Has Urology (Dr Thomasene Mohair)  DM- diet controlled.  UTD on microalbumin, eye exam, foot exam.  Due for A1C   Review of Systems For ROS see HPI   This visit occurred during the SARS-CoV-2 public health emergency.  Safety protocols were in place, including screening questions prior to the visit, additional usage of staff PPE, and extensive cleaning of exam room while observing appropriate contact time as indicated for disinfecting solutions.       Objective:   Physical Exam Vitals reviewed.  Constitutional:      General: He is not in acute distress.    Appearance: Normal appearance. He is well-developed. He is not ill-appearing.  HENT:     Head: Normocephalic and atraumatic.  Eyes:     Conjunctiva/sclera: Conjunctivae normal.     Pupils: Pupils are equal, round, and reactive to light.  Neck:     Thyroid: No thyromegaly.  Cardiovascular:     Rate and Rhythm: Normal rate and regular rhythm.     Heart sounds: Normal heart sounds. No murmur heard.   Pulmonary:     Effort: Pulmonary effort is normal. No respiratory distress.     Breath sounds: Normal breath sounds.  Abdominal:     General: Bowel sounds are normal. There is no distension.      Palpations: Abdomen is soft.  Musculoskeletal:     Cervical back: Normal range of motion and neck supple.  Lymphadenopathy:     Cervical: No cervical adenopathy.  Skin:    General: Skin is warm and dry.  Neurological:     Mental Status: He is alert and oriented to person, place, and time.     Cranial Nerves: No cranial nerve deficit.  Psychiatric:        Behavior: Behavior normal.           Assessment & Plan:  Epididymo-orchitis- new.  Reviewed ER visit and urology notes.  UA w/o evidence of infxn today.  Encouraged him to reach out to Urology if sxs persist.  Pt expressed understanding and is in agreement w/ plan.   Myalgia- pt reports his muscle and joint pains are severe after activity and when waking in the morning.  Will check labs to assess for underlying rheumatologic or inflammatory sxs.  Encouraged NSAIDs as needed.  Pt expressed understanding and is in agreement w/ plan.

## 2020-02-14 NOTE — Patient Instructions (Signed)
Schedule your complete physical for April We'll notify you of your lab results and make any changes if needed Continue to work on healthy diet and regular exercise- you can do it! If your groin pain changes or worsens, please call Dr Thomasene Mohair Call with any questions or concerns Stay Safe!  Stay Healthy!

## 2020-02-14 NOTE — Assessment & Plan Note (Signed)
Ongoing issue.  Currently diet controlled.  UTD on microalbumin, eye exam, foot exam.  Check A1C and determine if medication is needed

## 2020-02-17 ENCOUNTER — Encounter: Payer: Self-pay | Admitting: General Practice

## 2020-02-17 LAB — ANA: Anti Nuclear Antibody (ANA): NEGATIVE

## 2020-02-17 LAB — RHEUMATOID FACTOR: Rheumatoid fact SerPl-aCnc: <14 IU/mL (ref <14)

## 2020-02-21 ENCOUNTER — Other Ambulatory Visit: Payer: Self-pay

## 2020-02-21 ENCOUNTER — Ambulatory Visit (INDEPENDENT_AMBULATORY_CARE_PROVIDER_SITE_OTHER): Payer: Managed Care, Other (non HMO)

## 2020-02-21 ENCOUNTER — Ambulatory Visit (INDEPENDENT_AMBULATORY_CARE_PROVIDER_SITE_OTHER): Payer: Managed Care, Other (non HMO) | Admitting: Podiatry

## 2020-02-21 DIAGNOSIS — M7661 Achilles tendinitis, right leg: Secondary | ICD-10-CM | POA: Diagnosis not present

## 2020-02-21 DIAGNOSIS — M79671 Pain in right foot: Secondary | ICD-10-CM

## 2020-02-21 DIAGNOSIS — M216X1 Other acquired deformities of right foot: Secondary | ICD-10-CM

## 2020-02-21 DIAGNOSIS — Q66229 Congenital metatarsus adductus, unspecified foot: Secondary | ICD-10-CM

## 2020-02-21 DIAGNOSIS — M79672 Pain in left foot: Secondary | ICD-10-CM | POA: Diagnosis not present

## 2020-02-21 DIAGNOSIS — M216X2 Other acquired deformities of left foot: Secondary | ICD-10-CM

## 2020-02-21 DIAGNOSIS — M21862 Other specified acquired deformities of left lower leg: Secondary | ICD-10-CM

## 2020-02-21 DIAGNOSIS — M21861 Other specified acquired deformities of right lower leg: Secondary | ICD-10-CM

## 2020-02-21 MED ORDER — MELOXICAM 15 MG PO TABS: 15.0000 mg | ORAL_TABLET | Freq: Every day | ORAL | 3 refills | Status: DC

## 2020-02-21 NOTE — Patient Instructions (Signed)

## 2020-02-23 ENCOUNTER — Encounter: Payer: Self-pay | Admitting: Podiatry

## 2020-02-23 NOTE — Progress Notes (Signed)
  Subjective:  Patient ID: Jon Hipp., male    DOB: 04-28-1960,  MRN: [id number]  Chief Complaint  Patient presents with  . Foot Problem    Pt states generalized foot pain, 6 month duration, no known injuries. Pt states right foot is worse than left and pain is worsened by squeezing. Pt also admits severe leg cramps which he believes could be related.    59 y.o. male presents with the above complaint. History confirmed with patient.  He also has pain in the posterior heel.  He was diagnosed once the calcaneal spur he says.  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  He works on his feet all day and wears [employer] clogs and sandals as this is within the discomfort for him.  Objective:  Physical Exam: warm, good capillary refill, no trophic changes or ulcerative lesions, normal DP and PT pulses and normal sensory exam. Left Foot: Mild pain on palpation in the mid plantar arch Right Foot: Pain on palpation to mid plantar arch, pain along the lateral column with compression of the metatarsals, he has pain over the Achilles insertion and posterior calcaneus  Radiographs: X-ray of both feet: Mild pes cavus foot type with mild metatarsus adductus.  There is a small calcaneal enthesophyte posteriorly Assessment:   1. Achilles tendinitis, right leg   2. Pain in right foot   3. Pain in left foot   4. Metatarsus adductus   5. Gastrocnemius equinus of right lower extremity   6. Gastrocnemius equinus of left lower extremity      Plan:  Patient was evaluated and treated and all questions answered.   Overall he is a fairly difficult foot type with mild metatarsus adductus and pes cavus complicated by insertional Achilles tendinitis and metatarsalgia.  I believe he is overloading the lateral column on the right foot and this is driving most of the pain on the side other than the Achilles tenderness.  Given the complexity and  number of pedal issues he currently has I recommend that we focus on the Achilles tendinitis this is the most painful for him and see if we get this to improve.  -Recommend stretching icing of the right lower extremity for Achilles tendinitis -Felt pads dispensed to take pressure off the heel -Prescription for meloxicam sent to his pharmacy -Recommended Voltaren gel 3-4 times daily  Return in about 1 month (around 03/23/2020) for re-check Achilles tendon.

## 2020-03-07 NOTE — Progress Notes (Deleted)
Virtual Visit via Video Note   This visit type was conducted due to national recommendations for restrictions regarding the COVID-19 Pandemic (e.g. social distancing) in an effort to limit this patient's exposure and mitigate transmission in our community.  Due to his co-morbid illnesses, this patient is at least at moderate risk for complications without adequate follow up.  This format is felt to be most appropriate for this patient at this time.  All issues noted in this document were discussed and addressed.  A limited physical exam was performed with this format.  Please refer to the patient's chart for his consent to telehealth for [facility].   The patient was identified using 2 identifiers.  Date:  03/07/2020   ID:  Jon Hipp., DOB 1960-12-30, MRN [id number]  Patient Location: Home Provider Location: Home  PCP:  Midge Minium, MD  Cardiologist:  Franki Alcaide Martinique MD Electrophysiologist:  None   Evaluation Performed:  Follow-Up Visit  Chief Complaint:  CAD, orthostatic hypotension.  History of Present Illness:    Jon Manley Marciano Brooke Bonito. is a 59 y.o. male seen initially for evaluation of orthostatic hypotension. He has a history of DM and HLD. He has a family history of CAD with mother having several stents. He was seen in the ED for evaluation of chest pain in 2018. troponins were normal. Subsequent ETT as outpatient was normal.   For the past 4-5 months he has experienced orthostatic hypotension. Documented multiple times by BP recordings. No syncope but feels lightheaded. Thought it was related to testosterone but has not improved with stopping this. Symptoms worse in the early morning but now lingering into the afternoon. He does work out regularly. Notes his arms get weak now with activity and he is getting more SOB when he works out. BS has been elevated. Seeing Dr Buddy Duty. A1c 6.7%. medication not yet prescribed. Notes difficulty taking statins in the past  due to muscle weakness. Also notes having cold sweats at times- clammy.  On initial visit conservative measures to manage orthostasis were discussed. Echo was ordered. Coronary CT calcium score was ordered to assess CV risk. CT calcium score was 115 placing him at 72nd percentile for age/sex. He also had a  CT of the abdomen/pelvis showing a 4.8 cm left adrenal mass concerning for Pheochromocytoma. He underwent left adrenalectomy in June in [place]. Pathology confirmed Pheochromocytoma.    The patient does not have symptoms concerning for COVID-19 infection (fever, chills, cough, or new shortness of breath).    Past Medical History:  Diagnosis Date  . Anxiety   . BCC (basal cell carcinoma of skin) 03-2013   scalp  . BPH (benign prostatic hyperplasia)   . Cervical pain (neck)   . Diverticulosis   . Fatty liver   . GERD   . Headache   . HERPES SIMPLEX, UNCOMPLICATED   . History of exercise stress test    ETT 3/18: Ex 10'02", normal BP response, no ST changes; neg adequate ETT  . Irritable bowel syndrome (IBS)    WITH DIARRHEA  . LUMBAR RADICULOPATHY, LEFT   . Melanoma in situ ([place]) 12/2014   On lateral right thigh, Dr. Delice Lesch  . Other and unspecified hyperlipidemia   . Other chronic sinusitis   . Other testicular hypofunction   . PANIC ATTACK, ACUTE    Past Surgical History:  Procedure Laterality Date  . BACK SURGERY  2008  . birth mark removal     as baby  . COLONOSCOPY    .  GANGLION CYST EXCISION     finger  . HERNIA REPAIR     Inguinal  . ORCHIECTOMY Right ~1995   benign  . SPINE SURGERY    . TONSILLECTOMY       No outpatient medications have been marked as taking for the 03/11/20 encounter (Appointment) with Martinique, Shanine Kreiger M, MD.     Allergies:   Erythromycin   Social History   Tobacco Use  . Smoking status: Never Smoker  . Smokeless tobacco: Never Used  Vaping Use  . Vaping Use: Never used  Substance Use Topics  . Alcohol use: Yes    Alcohol/week: 0.0  standard drinks    Comment: Approximately once a month  . Drug use: No     Family Hx: The patient's family history includes Cancer in his maternal uncle; Colon cancer in his paternal grandmother; Heart attack in his mother and paternal grandfather; Heart disease in his maternal grandmother and mother; Lung cancer in his father; Prostate cancer in his father and paternal uncle. There is no history of Esophageal cancer, Rectal cancer, or Stomach cancer.  ROS:   Please see the history of present illness.     All other systems reviewed and are negative.   Prior CV studies:   The following studies were reviewed today:  ETT 06/30/16: Study Highlights    Blood pressure demonstrated a normal response to exercise.  There was no ST segment deviation noted during stress.  ETT with good exercise tolerance (10:02); no chest pain; normal BP response; no ST changes; negative adequate ETT; Duke treadmill score 10.   Stress Findings  ECG NSR, LAFB. .  Stress Findings The patient exercised following the Bruce protocol.  The patient reported no symptoms during the stress test. The patient experienced no angina during the stress test.   The patient requested the test to be stopped.   Blood pressure and heart rate demonstrated a normal response to exercise. Blood pressure demonstrated a normal response to exercise. Overall, the patient's exercise capacity was excellent.   85% of maximum heart rate was achieved after 6.3 minutes. Recovery time: 5 minutes. The patient's response to exercise was adequate for diagnosis.  Response to Stress There was no ST segment deviation noted during stress.  Arrhythmias during stress: none.  Arrhythmias during recovery: none.  There were no significant arrhythmias noted during the test.  ECG was interpretable and there was no significant change from baseline.  Stress Measurements     Baseline Vitals  Rest HR 89 bpm    Rest BP 151/92 mmHg     Exercise Time  Exercise duration (min) 10 min    Exercise duration (sec) 2 sec    Peak Stress Vitals  Peak HR 169 BPM    Exercise Data  MPHR 165 bpm    Percent HR 102 %    RPE 15     Estimated workload 11.7 METS       Resulted by:  Signed Date/Time  Phone Pager  CRENSHAW, BRIAN S 06/30/2016       Coronary Calcium Score  TECHNIQUE: The patient was scanned on a [employer] scanner. Axial non-contrast 3 mm slices were carried out through the heart. The data set was analyzed on a dedicated work station and scored using the [place].  FINDINGS: Non-cardiac: See separate report from [facility].  Ascending Aorta: Normal caliber.  Pericardium: Normal.  Coronary arteries: Normal origins.  IMPRESSION: Coronary calcium score of 115. This was 72nd percentile for age  and sex matched controls.  Eleonore Chiquito, MD   Electronically Signed   By: Eleonore Chiquito   On: 09/05/2019 12:23  CT ABDOMEN WITHOUT CONTRAST  TECHNIQUE: Multidetector CT imaging of the abdomen was performed following the standard protocol without IV contrast.  COMPARISON:  None.  FINDINGS: Lower chest: Lung bases are clear.  Hepatobiliary: Normal appearance of the liver and gallbladder.  Pancreas: Unremarkable. No pancreatic ductal dilatation or surrounding inflammatory changes.  Spleen: Normal in size without focal abnormality.  Adrenals/Urinary Tract: Solid left adrenal mass measures 4.8 x 2.8 x 3.6 cm. The Hounsfield units on this non contrast examination are 29. No inflammatory changes or hemorrhage around this left adrenal mass. Normal appearance of the right adrenal gland. Normal appearance of both kidneys without stones or hydronephrosis. No suspicious renal lesions.  Stomach/Bowel: Normal appearance of the stomach and visualized bowel structures. No evidence for bowel obstruction or inflammatory changes.  Vascular/Lymphatic: Normal  caliber of the abdominal aorta without aneurysm. Wall calcifications involving the right common iliac artery. Small lymph nodes in the central mesentery. Minimal stranding in the central mesentery is nonspecific could be postinflammatory.  Other: Negative for ascites.  Negative for free air.  Musculoskeletal: Disc space narrowing at L5-S1.  IMPRESSION: 1. Left adrenal mass measuring up to 4.8 cm. 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 Additional primary adrenal neoplasms cannot be excluded. Based on the size of this lesion, recommend surgical consultation for management or resection. 2. Mild stranding in the central abdominal mesentery with small lymph nodes in this area. These findings are nonspecific but could be postinflammatory.   Electronically Signed   By: Markus Daft M.D.   On: 08/29/2019 16:49    Labs/Other Tests and Data Reviewed:    EKG:  No ECG reviewed.  Recent Labs: 02/14/2020: ALT 15; BUN 18; Creatinine, Ser 1.31; Hemoglobin 14.0; Platelets 356.0; Potassium 4.6; Sodium 139; TSH 4.17   Recent Lipid Panel Lab Results  Component Value Date/Time   CHOL 140 02/14/2020 09:20 AM   TRIG 147.0 02/14/2020 09:20 AM   HDL 42.00 02/14/2020 09:20 AM   CHOLHDL 3 02/14/2020 09:20 AM   LDLCALC 69 02/14/2020 09:20 AM   LDLCALC 92 03/21/2018 03:41 PM   LDLDIRECT 114.0 10/13/2016 09:44 AM    Wt Readings from Last 3 Encounters:  02/14/20 178 lb (80.7 kg)  01/20/20 171 lb (77.6 kg)  09/15/19 174 lb (78.9 kg)     Objective:    Vital Signs:  There were no vitals taken for this visit.   VITAL SIGNS:  reviewed  General: WDWM in NAD Respirations unlabored.   ASSESSMENT & PLAN:    1.  Orthostatic hypotension. ? Related to hormonal imbalance with Pheochromocytoma.  I anticipate this will improve significantly after surgery.  2. Prediabetes/ ?DM- per endocrine 3. Hypercholesterolemia. Resistant to taking statin  therapy. Given elevated coronary calcium score ideally would like to see LDL less than 70. On fenofibrate. Will add Zetia 10 mg daily. Recommend repeat lipids in 3-4 months. 4. Dyspnea on exertion Echo done 5/13- not yet reported. Will follow up results.  5. CKD stage 3. 6. 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 Plan surgery next week.  7. Coronary calcification. Focus on risk factor modification.  COVID-19 Education: The signs and symptoms of COVID-19 were discussed with the patient and how to seek care for testing (follow up with PCP or arrange E-visit).  The importance of social distancing was discussed today.  Time:   Today, I have spent 10 minutes with the patient with telehealth technology discussing the above problems.     Medication Adjustments/Labs and Tests Ordered: Current medicines are reviewed at length with the patient today.  Concerns regarding medicines are outlined above.   Tests Ordered: No orders of the defined types were placed in this encounter.   Medication Changes: No orders of the defined types were placed in this encounter.   Follow Up:  In Person in 6 month(s)  Signed, Tawana Pasch Martinique, MD  03/07/2020 3:29 PM    [place]

## 2020-03-11 ENCOUNTER — Other Ambulatory Visit: Payer: Self-pay | Admitting: Podiatry

## 2020-03-11 ENCOUNTER — Telehealth: Payer: Managed Care, Other (non HMO) | Admitting: Cardiology

## 2020-03-11 DIAGNOSIS — M216X2 Other acquired deformities of left foot: Secondary | ICD-10-CM

## 2020-03-11 DIAGNOSIS — M7661 Achilles tendinitis, right leg: Secondary | ICD-10-CM

## 2020-03-11 DIAGNOSIS — M21862 Other specified acquired deformities of left lower leg: Secondary | ICD-10-CM

## 2020-03-27 ENCOUNTER — Other Ambulatory Visit: Payer: Self-pay

## 2020-03-27 ENCOUNTER — Ambulatory Visit (INDEPENDENT_AMBULATORY_CARE_PROVIDER_SITE_OTHER): Payer: Managed Care, Other (non HMO) | Admitting: Podiatry

## 2020-03-27 DIAGNOSIS — Q66229 Congenital metatarsus adductus, unspecified foot: Secondary | ICD-10-CM | POA: Diagnosis not present

## 2020-03-27 DIAGNOSIS — M21862 Other specified acquired deformities of left lower leg: Secondary | ICD-10-CM

## 2020-03-27 DIAGNOSIS — M21861 Other specified acquired deformities of right lower leg: Secondary | ICD-10-CM

## 2020-03-27 DIAGNOSIS — M7661 Achilles tendinitis, right leg: Secondary | ICD-10-CM

## 2020-03-27 DIAGNOSIS — M79672 Pain in left foot: Secondary | ICD-10-CM | POA: Diagnosis not present

## 2020-03-27 DIAGNOSIS — M216X1 Other acquired deformities of right foot: Secondary | ICD-10-CM

## 2020-03-27 DIAGNOSIS — M216X2 Other acquired deformities of left foot: Secondary | ICD-10-CM

## 2020-03-27 DIAGNOSIS — M79671 Pain in right foot: Secondary | ICD-10-CM

## 2020-03-28 NOTE — Progress Notes (Signed)
Virtual Visit via Video Note   This visit type was conducted due to national recommendations for restrictions regarding the COVID-19 Pandemic (e.g. social distancing) in an effort to limit this patient's exposure and mitigate transmission in our community.  Due to his co-morbid illnesses, this patient is at least at moderate risk for complications without adequate follow up.  This format is felt to be most appropriate for this patient at this time.  All issues noted in this document were discussed and addressed.  A limited physical exam was performed with this format.  Please refer to the patient's chart for his consent to telehealth for [facility].   The patient was identified using 2 identifiers.  Date:  04/03/2020   ID:  Jon Richardson., DOB 02/15/1961, MRN [id number]  Patient Location: Home Provider Location: Home  PCP:  Midge Minium, MD  Cardiologist:  Shakinah Navis Martinique MD Electrophysiologist:  None   Evaluation Performed:  Follow-Up Visit  Chief Complaint:  CAD, orthostatic hypotension.  History of Present Illness:    Randen Kauth Corsi Brooke Bonito. is a 59 y.o. male seen initially for evaluation of orthostatic hypotension. He has a history of DM and HLD. He has a family history of CAD with mother having several stents. He was seen in the ED for evaluation of chest pain in 2018. troponins were normal. Subsequent ETT as outpatient was normal.   For the past 4-5 months he has experienced orthostatic hypotension. Documented multiple times by BP recordings. No syncope but feels lightheaded. Thought it was related to testosterone but has not improved with stopping this. Symptoms worse in the early morning but now lingering into the afternoon. He does work out regularly. Notes his arms get weak now with activity and he is getting more SOB when he works out. BS has been elevated. Seeing Dr Buddy Duty. A1c 6.7%. medication not yet prescribed. Notes difficulty taking statins in the past  due to muscle weakness. Also notes having cold sweats at times- clammy.  On initial visit conservative measures to manage orthostasis were discussed. Echo was ordered. Coronary CT calcium score was ordered to assess CV risk. CT calcium score was 115 placing him at 72nd percentile for age/sex. He also had a  CT of the abdomen/pelvis showing a 4.8 cm left adrenal mass concerning for Pheochromocytoma. He underwent left adrenalectomy in June in [place]. Pathology confirmed Pheochromocytoma.  Since his surgery all his symptoms resolved. No recurrent orthostasis. His lipids and sugars have improved. No chest pain or SOB. He does complain of pain in his right thigh like a bad toothache. This is not related to exertion.    The patient does not have symptoms concerning for COVID-19 infection (fever, chills, cough, or new shortness of breath).    Past Medical History:  Diagnosis Date  . Anxiety   . BCC (basal cell carcinoma of skin) 03-2013   scalp  . BPH (benign prostatic hyperplasia)   . Cervical pain (neck)   . Diverticulosis   . Fatty liver   . GERD   . Headache   . HERPES SIMPLEX, UNCOMPLICATED   . History of exercise stress test    ETT 3/18: Ex 10'02", normal BP response, no ST changes; neg adequate ETT  . Irritable bowel syndrome (IBS)    WITH DIARRHEA  . LUMBAR RADICULOPATHY, LEFT   . Melanoma in situ ([place]) 12/2014   On lateral right thigh, Dr. Delice Lesch  . Other and unspecified hyperlipidemia   . Other chronic sinusitis   .  Other testicular hypofunction   . PANIC ATTACK, ACUTE    Past Surgical History:  Procedure Laterality Date  . BACK SURGERY  2008  . birth mark removal     as baby  . COLONOSCOPY    . GANGLION CYST EXCISION     finger  . HERNIA REPAIR     Inguinal  . ORCHIECTOMY Right ~1995   benign  . SPINE SURGERY    . TONSILLECTOMY       Current Meds  Medication Sig  . Alpha-D-Galactosidase (BEANO) TABS Take by mouth as needed.  Marland Kitchen aspirin 81 MG tablet Take 81 mg  by mouth daily.  Marland Kitchen ezetimibe (ZETIA) 10 MG tablet Take 1 tablet (10 mg total) by mouth daily.  . fenofibrate 160 MG tablet TAKE ONE TABLET BY MOUTH DAILY  . fluticasone (FLONASE) 50 MCG/ACT nasal spray Place 2 sprays into both nostrils daily.  . pantoprazole (PROTONIX) 40 MG tablet TAKE ONE TABLET BY MOUTH DAILY  . valACYclovir (VALTREX) 500 MG tablet TAKE ONE TABLET BY MOUTH DAILY  . Zinc 50 MG CAPS Take by mouth.     Allergies:   Erythromycin   Social History   Tobacco Use  . Smoking status: Never Smoker  . Smokeless tobacco: Never Used  Vaping Use  . Vaping Use: Never used  Substance Use Topics  . Alcohol use: Yes    Alcohol/week: 0.0 standard drinks    Comment: Approximately once a month  . Drug use: No     Family Hx: The patient's family history includes Cancer in his maternal uncle; Colon cancer in his paternal grandmother; Heart attack in his mother and paternal grandfather; Heart disease in his maternal grandmother and mother; Lung cancer in his father; Prostate cancer in his father and paternal uncle. There is no history of Esophageal cancer, Rectal cancer, or Stomach cancer.  ROS:   Please see the history of present illness.     All other systems reviewed and are negative.   Prior CV studies:   The following studies were reviewed today:  ETT 06/30/16: Study Highlights    Blood pressure demonstrated a normal response to exercise.  There was no ST segment deviation noted during stress.  ETT with good exercise tolerance (10:02); no chest pain; normal BP response; no ST changes; negative adequate ETT; Duke treadmill score 10.   Stress Findings  ECG NSR, LAFB. .  Stress Findings The patient exercised following the Bruce protocol.  The patient reported no symptoms during the stress test. The patient experienced no angina during the stress test.   The patient requested the test to be stopped.   Blood pressure and heart rate demonstrated a normal response  to exercise. Blood pressure demonstrated a normal response to exercise. Overall, the patient's exercise capacity was excellent.   85% of maximum heart rate was achieved after 6.3 minutes. Recovery time: 5 minutes. The patient's response to exercise was adequate for diagnosis.  Response to Stress There was no ST segment deviation noted during stress.  Arrhythmias during stress: none.  Arrhythmias during recovery: none.  There were no significant arrhythmias noted during the test.  ECG was interpretable and there was no significant change from baseline.  Stress Measurements     Baseline Vitals  Rest HR 89 bpm    Rest BP 151/92 mmHg    Exercise Time  Exercise duration (min) 10 min    Exercise duration (sec) 2 sec    Peak Stress Vitals  Peak HR 169 BPM  Exercise Data  MPHR 165 bpm    Percent HR 102 %    RPE 15     Estimated workload 11.7 METS       Resulted by:  Signed Date/Time  Phone Pager  CRENSHAW, BRIAN S 06/30/2016       Coronary Calcium Score  TECHNIQUE: The patient was scanned on a [employer] scanner. Axial non-contrast 3 mm slices were carried out through the heart. The data set was analyzed on a dedicated work station and scored using the [place].  FINDINGS: Non-cardiac: See separate report from [facility].  Ascending Aorta: Normal caliber.  Pericardium: Normal.  Coronary arteries: Normal origins.  IMPRESSION: Coronary calcium score of 115. This was 72nd percentile for age and sex matched controls.  Eleonore Chiquito, MD   Electronically Signed   By: Eleonore Chiquito   On: 09/05/2019 12:23  CT ABDOMEN WITHOUT CONTRAST  TECHNIQUE: Multidetector CT imaging of the abdomen was performed following the standard protocol without IV contrast.  COMPARISON:  None.  FINDINGS: Lower chest: Lung bases are clear.  Hepatobiliary: Normal appearance of the liver and gallbladder.  Pancreas:  Unremarkable. No pancreatic ductal dilatation or surrounding inflammatory changes.  Spleen: Normal in size without focal abnormality.  Adrenals/Urinary Tract: Solid left adrenal mass measures 4.8 x 2.8 x 3.6 cm. The Hounsfield units on this non contrast examination are 29. No inflammatory changes or hemorrhage around this left adrenal mass. Normal appearance of the right adrenal gland. Normal appearance of both kidneys without stones or hydronephrosis. No suspicious renal lesions.  Stomach/Bowel: Normal appearance of the stomach and visualized bowel structures. No evidence for bowel obstruction or inflammatory changes.  Vascular/Lymphatic: Normal caliber of the abdominal aorta without aneurysm. Wall calcifications involving the right common iliac artery. Small lymph nodes in the central mesentery. Minimal stranding in the central mesentery is nonspecific could be postinflammatory.  Other: Negative for ascites.  Negative for free air.  Musculoskeletal: Disc space narrowing at L5-S1.  IMPRESSION: 1. Left adrenal mass measuring up to 4.8 cm. 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 Additional primary adrenal neoplasms cannot be excluded. Based on the size of this lesion, recommend surgical consultation for management or resection. 2. Mild stranding in the central abdominal mesentery with small lymph nodes in this area. These findings are nonspecific but could be postinflammatory.   Electronically Signed   By: Markus Daft M.D.   On: 08/29/2019 16:49  Echo 09/05/19:IMPRESSIONS    1. Left ventricular ejection fraction, by estimation, is 60 to 65%. The  left ventricle has normal function. The left ventricle has no regional  wall motion abnormalities. There is mild concentric left ventricular  hypertrophy. Left ventricular diastolic  parameters are consistent with Grade I diastolic dysfunction (impaired  relaxation).   2. Right ventricular systolic function is normal. The right ventricular  size is normal. There is normal pulmonary artery systolic pressure.  3. The mitral valve is normal in structure. No evidence of mitral valve  regurgitation. No evidence of mitral stenosis.  4. The aortic valve is tricuspid. Aortic valve regurgitation is not  visualized. No aortic stenosis is present.  5. The inferior vena cava is normal in size with greater than 50%  respiratory variability, suggesting right atrial pressure of 3 mmHg.    Labs/Other Tests and Data Reviewed:    EKG:  No ECG reviewed.  Recent Labs: 02/14/2020: ALT 15; BUN 18; Creatinine, Ser 1.31; Hemoglobin 14.0; Platelets 356.0; Potassium 4.6;  Sodium 139; TSH 4.17   Recent Lipid Panel Lab Results  Component Value Date/Time   CHOL 140 02/14/2020 09:20 AM   TRIG 147.0 02/14/2020 09:20 AM   HDL 42.00 02/14/2020 09:20 AM   CHOLHDL 3 02/14/2020 09:20 AM   LDLCALC 69 02/14/2020 09:20 AM   LDLCALC 92 03/21/2018 03:41 PM   LDLDIRECT 114.0 10/13/2016 09:44 AM    Wt Readings from Last 3 Encounters:  04/03/20 178 lb 8 oz (81 kg)  02/14/20 178 lb (80.7 kg)  01/20/20 171 lb (77.6 kg)     Objective:    Vital Signs:  BP 137/72   Pulse (!) 59   Ht 5\' 8"  (1.727 m)   Wt 178 lb 8 oz (81 kg)   BMI 27.14 kg/m    VITAL SIGNS:  reviewed    ASSESSMENT & PLAN:    1.  Orthostatic hypotension. Related to hormonal imbalance with Pheochromocytoma.  This has resolved since his surgery 2. Prediabetes- per endocrine- sugars improved.  3. Hypercholesterolemia. Resistant to taking statin therapy. Given elevated coronary calcium score ideally would like to see LDL less than 70. On fenofibrate and Zetia. I doubt leg pain is related to Zetia but suggested he take a drug holiday and see if symptoms improve. If not he should resume Zetia. If symptoms do improve then try reintroducing Zetia and see if symptoms recur.  LDL is currently at goal.  4. Dyspnea on  exertion resolved. Prior ETT and Echo unremarkable except for mild LVH.  5. CKD stage 3. 6. 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 S/p surgical resectdion. 7. Coronary calcification. Focus on risk factor modification.  COVID-19 Education: The signs and symptoms of COVID-19 were discussed with the patient and how to seek care for testing (follow up with PCP or arrange E-visit).  The importance of social distancing was discussed today.  Time:   Today, I have spent 10 minutes with the patient with telehealth technology discussing the above problems.     Medication Adjustments/Labs and Tests Ordered: Current medicines are reviewed at length with the patient today.  Concerns regarding medicines are outlined above.   Tests Ordered: No orders of the defined types were placed in this encounter.   Medication Changes: No orders of the defined types were placed in this encounter.   Follow Up:  One year  Signed, Yazir Koerber Martinique, MD  04/03/2020 9:19 AM    [facility]

## 2020-03-30 ENCOUNTER — Encounter: Payer: Self-pay | Admitting: Podiatry

## 2020-03-30 NOTE — Progress Notes (Signed)
  Subjective:  Patient ID: Jon Richardson., male    DOB: Sep 14, 1960,  MRN: [id number]  Chief Complaint  Patient presents with  . Foot Pain    Pt stated that he is doing well. He has no concerns and denies any pain at this time. Stated that he couldnt take the mobic because it messed with his stomach     59 y.o. male returns with the above complaint. History confirmed with patient.  Overall he has had some improvement.  He has been wearing Birkenstocks and doing the stretching exercises.  Voltaren gel has been helpful.  Still has pain in the lateral fifth metatarsal right side  Objective:  Physical Exam: warm, good capillary refill, no trophic changes or ulcerative lesions, normal DP and PT pulses and normal sensory exam.  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  There is a small calcaneal enthesophyte posteriorly Assessment:   No diagnosis found.   Plan:  Patient was evaluated and treated and all questions answered.   -Has had some improvement. -Recommend he continue stretching icing and Voltaren -I think that orthotics to be beneficial to reduce the pain he has overloading his lateral column due to his met adductus deformity  Return in about 2 months (around 05/28/2020).

## 2020-04-03 ENCOUNTER — Telehealth (INDEPENDENT_AMBULATORY_CARE_PROVIDER_SITE_OTHER): Payer: Managed Care, Other (non HMO) | Admitting: Cardiology

## 2020-04-03 ENCOUNTER — Encounter: Payer: Self-pay | Admitting: Cardiology

## 2020-04-03 VITALS — BP 137/72 | HR 59 | Ht 68.0 in | Wt 178.5 lb

## 2020-04-03 DIAGNOSIS — D3502 Benign neoplasm of left adrenal gland: Secondary | ICD-10-CM | POA: Diagnosis not present

## 2020-04-03 DIAGNOSIS — I951 Orthostatic hypotension: Secondary | ICD-10-CM | POA: Diagnosis not present

## 2020-04-03 DIAGNOSIS — E786 Lipoprotein deficiency: Secondary | ICD-10-CM

## 2020-04-03 DIAGNOSIS — R931 Abnormal findings on diagnostic imaging of heart and coronary circulation: Secondary | ICD-10-CM

## 2020-04-10 ENCOUNTER — Other Ambulatory Visit: Payer: Self-pay

## 2020-04-10 ENCOUNTER — Ambulatory Visit (INDEPENDENT_AMBULATORY_CARE_PROVIDER_SITE_OTHER): Payer: Managed Care, Other (non HMO) | Admitting: Orthotics

## 2020-04-10 DIAGNOSIS — M216X1 Other acquired deformities of right foot: Secondary | ICD-10-CM | POA: Diagnosis not present

## 2020-04-10 DIAGNOSIS — M216X2 Other acquired deformities of left foot: Secondary | ICD-10-CM | POA: Diagnosis not present

## 2020-04-10 DIAGNOSIS — M7661 Achilles tendinitis, right leg: Secondary | ICD-10-CM

## 2020-04-10 DIAGNOSIS — Q66229 Congenital metatarsus adductus, unspecified foot: Secondary | ICD-10-CM

## 2020-04-10 NOTE — Progress Notes (Signed)
Patient came into today to be cast for [employer] [facility] [employer]. Upon recommendation of Dr. McDonald Patient presents with achilles tendo, lateral border overload, met adductus Goals are heel lift and valgus wedge ff Plan vendor richei  

## 2020-05-07 ENCOUNTER — Other Ambulatory Visit: Payer: Self-pay

## 2020-05-07 ENCOUNTER — Ambulatory Visit: Payer: Managed Care, Other (non HMO) | Admitting: Orthotics

## 2020-05-07 DIAGNOSIS — M7661 Achilles tendinitis, right leg: Secondary | ICD-10-CM

## 2020-05-07 DIAGNOSIS — Q66229 Congenital metatarsus adductus, unspecified foot: Secondary | ICD-10-CM

## 2020-05-07 NOTE — Progress Notes (Signed)
Patient picked up f/o and was pleased with fit, comfort, and function.  Worked well with footwear.  Told of rbeak in period and how to report any issues.  

## 2020-05-13 ENCOUNTER — Other Ambulatory Visit: Payer: Self-pay | Admitting: Family Medicine

## 2020-05-28 ENCOUNTER — Other Ambulatory Visit: Payer: Self-pay

## 2020-05-28 ENCOUNTER — Ambulatory Visit: Payer: Managed Care, Other (non HMO) | Admitting: Podiatry

## 2020-05-28 DIAGNOSIS — Q66229 Congenital metatarsus adductus, unspecified foot: Secondary | ICD-10-CM

## 2020-05-31 ENCOUNTER — Encounter: Payer: Self-pay | Admitting: Podiatry

## 2020-05-31 NOTE — Progress Notes (Signed)
  Subjective:  Patient ID: Jon Richardson., male    DOB: 12/16/1960,  MRN: [id number]  Chief Complaint  Patient presents with  . achillies tendinitis     Pt stated that he is doing well he has no concerns and he stated that the only time he has pain is if he is on his foot for too long    60 y.o. male returns with the above complaint. History confirmed with patient.  Received his orthotics.  He is doing much better with them.  They are not super comfortable in his sneakers because the height on the heel lifts him out of the shoe.  Very comfortable in dress shoes.  Still wearing his Birkenstocks as well which are helpful  Objective:  Physical Exam: warm, good capillary refill, no trophic changes or ulcerative lesions, normal DP and PT pulses and normal sensory exam.  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  There is a small calcaneal enthesophyte posteriorly Assessment:   1. Metatarsus adductus      Plan:  Patient was evaluated and treated and all questions answered.   -Doing well with orthotics.  I think it be good if we are able to get him into a athletic shoe that he can wear.  He has some Hoka's at home that I recommended he try the orthotics and to see if different shoes would make a difference than the current under Armour shoes he has.  He will try this.  He may also get a 2nd pair of orthotics with a center shell and/or a reduced heel height.  If other shoes are uncomfortable he will have an appointment scheduled with Liliane Channel to see if these can be adjusted and/or a 2nd pair made  Return in about 6 months (around 11/25/2020).

## 2020-06-08 ENCOUNTER — Ambulatory Visit: Payer: Managed Care, Other (non HMO) | Admitting: Family Medicine

## 2020-06-08 ENCOUNTER — Other Ambulatory Visit (INDEPENDENT_AMBULATORY_CARE_PROVIDER_SITE_OTHER): Payer: Managed Care, Other (non HMO)

## 2020-06-08 ENCOUNTER — Telehealth: Payer: Self-pay | Admitting: Gastroenterology

## 2020-06-08 ENCOUNTER — Other Ambulatory Visit: Payer: Self-pay

## 2020-06-08 ENCOUNTER — Ambulatory Visit: Payer: Managed Care, Other (non HMO) | Admitting: Physician Assistant

## 2020-06-08 ENCOUNTER — Encounter: Payer: Self-pay | Admitting: Family Medicine

## 2020-06-08 VITALS — BP 120/82 | HR 71 | Temp 97.9°F | Resp 19 | Ht 68.0 in | Wt 185.8 lb

## 2020-06-08 DIAGNOSIS — K219 Gastro-esophageal reflux disease without esophagitis: Secondary | ICD-10-CM

## 2020-06-08 MED ORDER — PANTOPRAZOLE SODIUM 40 MG PO TBEC: 40.0000 mg | DELAYED_RELEASE_TABLET | Freq: Two times a day (BID) | ORAL | 3 refills | Status: DC

## 2020-06-08 MED ORDER — FAMOTIDINE 40 MG PO TABS: 40.0000 mg | ORAL_TABLET | Freq: Every day | ORAL | 3 refills | Status: DC

## 2020-06-08 MED ORDER — SUCRALFATE 1 G PO TABS: 1.0000 g | ORAL_TABLET | Freq: Three times a day (TID) | ORAL | 0 refills | Status: DC

## 2020-06-08 NOTE — Telephone Encounter (Signed)
Pt states that he has been having chest pain and dark stools since last wek. He would like to be seen soon.

## 2020-06-08 NOTE — Patient Instructions (Signed)
Follow up via phone or MyChart in 1-2 weeks to let me know how things are going We'll notify you of your lab results and make any changes if needed CONTINUE the Pantoprazole twice daily ADD the Famotidine once daily TAKE the Sucralfate prior to meals and before bed Call with any questions or concerns Hang in there!!!

## 2020-06-08 NOTE — Progress Notes (Signed)
Pt was here for labs only pt tolerated lab draw well.

## 2020-06-08 NOTE — Progress Notes (Signed)
   Subjective:    Patient ID: Jon Hipp., male    DOB: [birth year], 60 y.o.   MRN: [id number]  HPI GERD- Pt reports pain will run down the center of his chest to just above his umbilicus.  sxs started last week.  'nothing's working'.  Started pepto and developed dark stools.  Continues to take Pantoprazole daily.  Has increased dose to BID.  Added Tagament and Pepto.  Increased belching.  sxs will flare when sleeping, when bending forward.  Eating can make it better or worse- depending.  Sxs improve w/ elevation when sleeping.  No change in diet last week.  High stress levels.  Increased bowel sounds- 'why is my stomach talking so much'.   Review of Systems For ROS see HPI   This visit occurred during the SARS-CoV-2 public health emergency.  Safety protocols were in place, including screening questions prior to the visit, additional usage of staff PPE, and extensive cleaning of exam room while observing appropriate contact time as indicated for disinfecting solutions.       Objective:   Physical Exam Vitals reviewed.  Constitutional:      General: He is not in acute distress.    Appearance: Normal appearance. He is not ill-appearing.  HENT:     Head: Normocephalic and atraumatic.  Eyes:     Extraocular Movements: Extraocular movements intact.     Conjunctiva/sclera: Conjunctivae normal.     Pupils: Pupils are equal, round, and reactive to light.  Abdominal:     General: Abdomen is flat. Bowel sounds are normal. There is no distension.     Palpations: Abdomen is soft.     Tenderness: There is no abdominal tenderness. There is no guarding or rebound.  Skin:    General: Skin is warm and dry.  Neurological:     General: No focal deficit present.     Mental Status: He is alert and oriented to person, place, and time.  Psychiatric:        Mood and Affect: Mood normal.        Behavior: Behavior normal.        Thought Content: Thought content normal.            Assessment & Plan:  GERD- deteriorated.  Pt's sxs are consistent w/ worsening GERD/gastritis.  Dark stools are due to the addition of Pepto.  Continue Protonix BID and add Famotidine and Carafate.  Discussed dietary and lifestyle modifications.  Check H pylori.  If no improvement will need GI referral.  Pt expressed understanding and is in agreement w/ plan.

## 2020-06-08 NOTE — Telephone Encounter (Signed)
Patient saw his PCP this am.  He will call back if he needs GI follow up after 2 weeks on the prescribed regimen . Dark stools are from peptobismol- patient reported

## 2020-06-10 ENCOUNTER — Encounter: Payer: Self-pay | Admitting: Family Medicine

## 2020-06-10 LAB — H PYLORI, IGM, IGG, IGA AB
H pylori, IgM Abs: <9 units (ref 0.0–8.9)
H. pylori, IgA Abs: <9 units (ref 0.0–8.9)
H. pylori, IgG AbS: 0.27 Index Value (ref 0.00–0.79)

## 2020-06-10 NOTE — Telephone Encounter (Signed)
Pt called in asking for lab results, please advise

## 2020-06-11 NOTE — Progress Notes (Signed)
Called patient and reported lab results. Pt send mychart message regarding medication prescribed on 2/14. Message routed to Dr. Birdie Riddle.

## 2020-06-29 ENCOUNTER — Other Ambulatory Visit: Payer: Self-pay | Admitting: Family Medicine

## 2020-08-05 ENCOUNTER — Encounter: Payer: Self-pay | Admitting: Family Medicine

## 2020-08-05 DIAGNOSIS — Z202 Contact with and (suspected) exposure to infections with a predominantly sexual mode of transmission: Secondary | ICD-10-CM

## 2020-08-06 NOTE — Telephone Encounter (Signed)
Can we do this testing today or rather wait till next week?

## 2020-08-06 NOTE — Telephone Encounter (Signed)
Patient is scheduled an appointment on 08/17/2020 with Orland Mustard.

## 2020-08-06 NOTE — Telephone Encounter (Signed)
Please advise. Would you like me to call me and schedule patient an appointment?

## 2020-08-17 ENCOUNTER — Ambulatory Visit: Payer: Managed Care, Other (non HMO) | Admitting: Registered Nurse

## 2020-09-02 ENCOUNTER — Other Ambulatory Visit: Payer: Self-pay | Admitting: Cardiology

## 2020-09-16 ENCOUNTER — Other Ambulatory Visit: Payer: Self-pay | Admitting: Family Medicine

## 2020-09-17 ENCOUNTER — Other Ambulatory Visit: Payer: Self-pay

## 2020-09-17 DIAGNOSIS — B009 Herpesviral infection, unspecified: Secondary | ICD-10-CM

## 2020-09-17 MED ORDER — VALACYCLOVIR HCL 500 MG PO TABS: 500.0000 mg | ORAL_TABLET | Freq: Every day | ORAL | 1 refills | Status: DC

## 2020-09-30 ENCOUNTER — Other Ambulatory Visit: Payer: Self-pay | Admitting: Family Medicine

## 2020-10-16 ENCOUNTER — Other Ambulatory Visit: Payer: Self-pay | Admitting: Family Medicine

## 2020-10-21 ENCOUNTER — Encounter: Payer: Self-pay | Admitting: *Deleted

## 2020-11-12 ENCOUNTER — Other Ambulatory Visit: Payer: Self-pay

## 2020-11-12 DIAGNOSIS — B009 Herpesviral infection, unspecified: Secondary | ICD-10-CM

## 2020-11-12 MED ORDER — VALACYCLOVIR HCL 500 MG PO TABS: 500.0000 mg | ORAL_TABLET | Freq: Every day | ORAL | 1 refills | Status: DC

## 2020-11-26 ENCOUNTER — Ambulatory Visit: Payer: Managed Care, Other (non HMO) | Admitting: Podiatry

## 2020-11-26 ENCOUNTER — Other Ambulatory Visit: Payer: Self-pay

## 2020-11-26 DIAGNOSIS — M7661 Achilles tendinitis, right leg: Secondary | ICD-10-CM | POA: Diagnosis not present

## 2020-11-26 DIAGNOSIS — M21621 Bunionette of right foot: Secondary | ICD-10-CM

## 2020-11-26 DIAGNOSIS — Q66229 Congenital metatarsus adductus, unspecified foot: Secondary | ICD-10-CM

## 2020-11-30 NOTE — Progress Notes (Signed)
  Subjective:  Patient ID: Jon Hipp., male    DOB: 1961/04/09,  MRN: [id number]  Chief Complaint  Patient presents with   Tendonitis     6 month follow up Achilles tendonitis right    60 y.o. male returns with the above complaint. History confirmed with patient.  His Achilles is feeling better.  His orthotics are still more comfortable in dress shoes than sneakers.  He is having pain on the outside of both feet and the fifth toe  Objective:  Physical Exam: warm, good capillary refill, no trophic changes or ulcerative lesions, normal DP and PT pulses and normal sensory exam.  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  There is a small calcaneal enthesophyte posteriorly Assessment:   1. Tailor's bunion of right foot   2. Metatarsus adductus   3. Achilles tendinitis, right leg      Plan:  Patient was evaluated and treated and all questions answered.   -Achilles has improved.  He does have pain today on the tailor's bunion.  I dispensed him silicone offloading pads to alleviate pressure.  We discussed shoe gear types that make this worse including a tapered point.  He also think about a second pair of dress orthotics to try to see if this would be more comfortable in his sneakers than his current ones.  He will let me know if he would like to pursue this route and we can order it based on his old cast.  Return as needed at this point  Return if symptoms worsen or fail to improve.

## 2020-12-10 IMAGING — US US SCROTUM W/ DOPPLER COMPLETE
1 series · 13 of 25 positions shown · non-contrast
Comparison: None.

CLINICAL DATA: Five days of left testicular pain, history of right
orchiectomy

EXAM:
SCROTAL ULTRASOUND
DOPPLER ULTRASOUND OF THE TESTICLES
TECHNIQUE: Complete ultrasound examination of the testicles, epididymis, and
other scrotal structures was performed. Color and spectral Doppler
ultrasound were also utilized to evaluate blood flow to the
testicles.

[Series 1: us scrotum w/doppler · 13 of 40 slices shown]
[im 1/40]
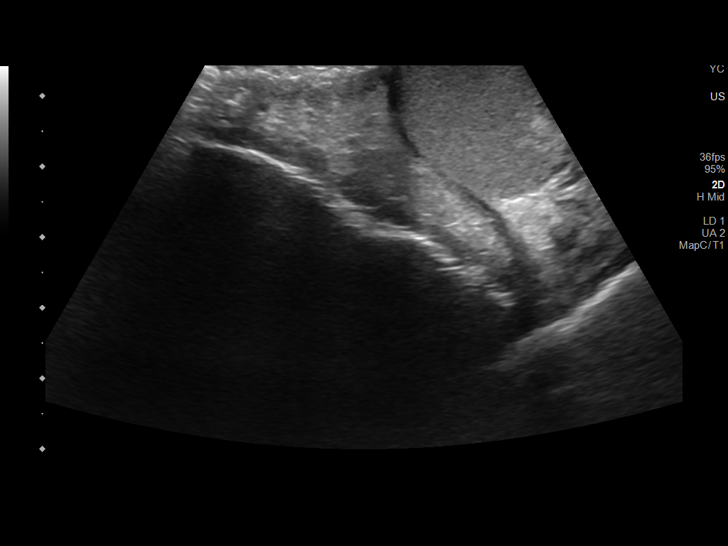
[im 4/40]
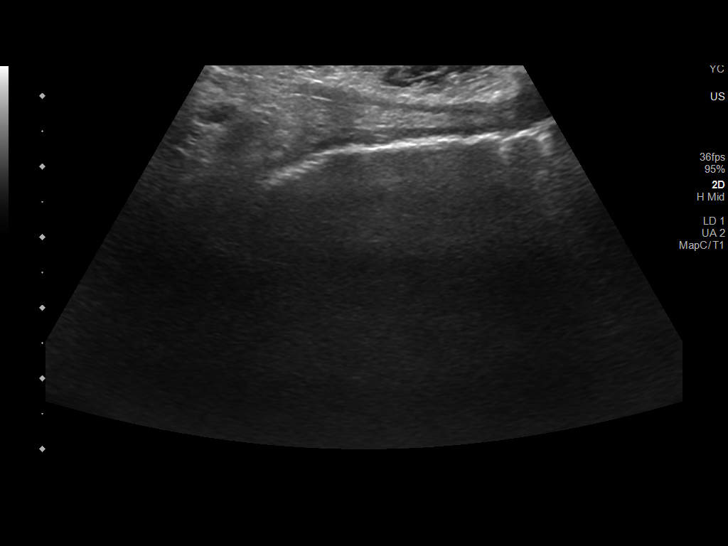
[im 7/40]
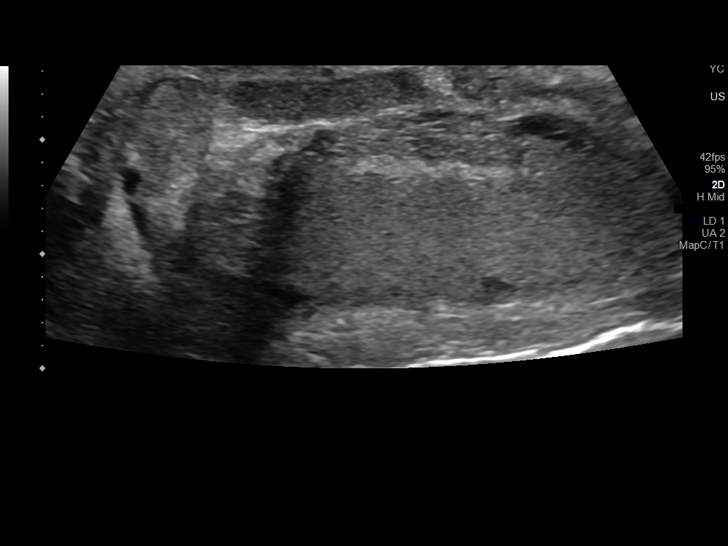
[im 10/40]
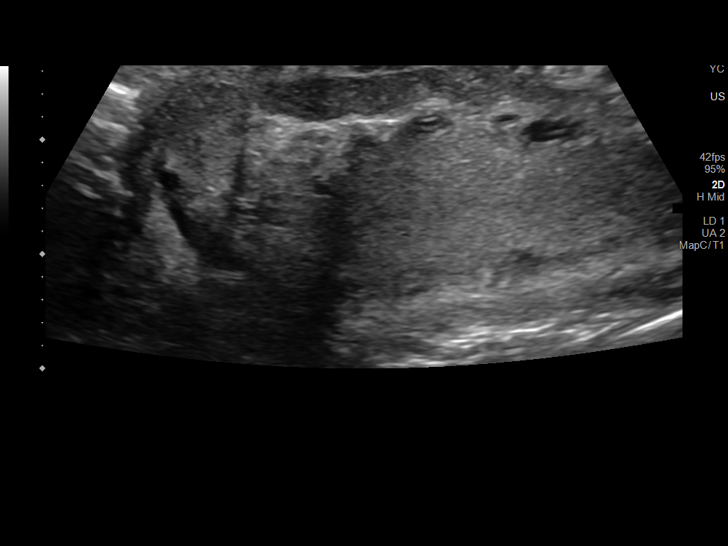
[im 14/40]
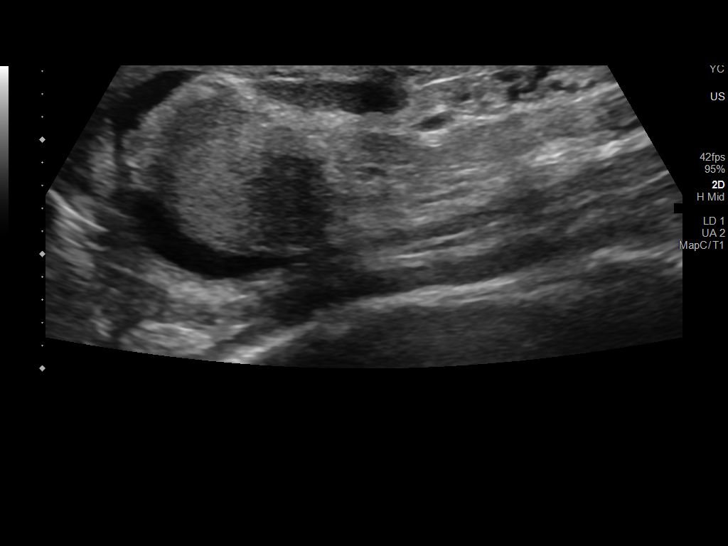
[im 17/40]
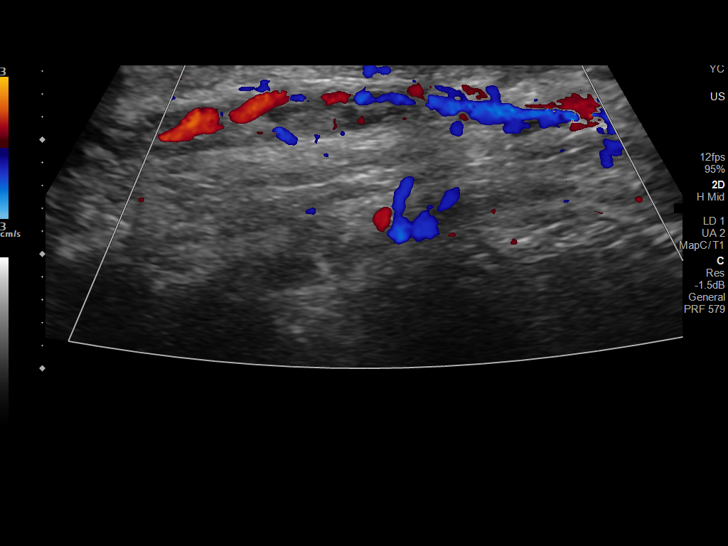
[im 20/40]
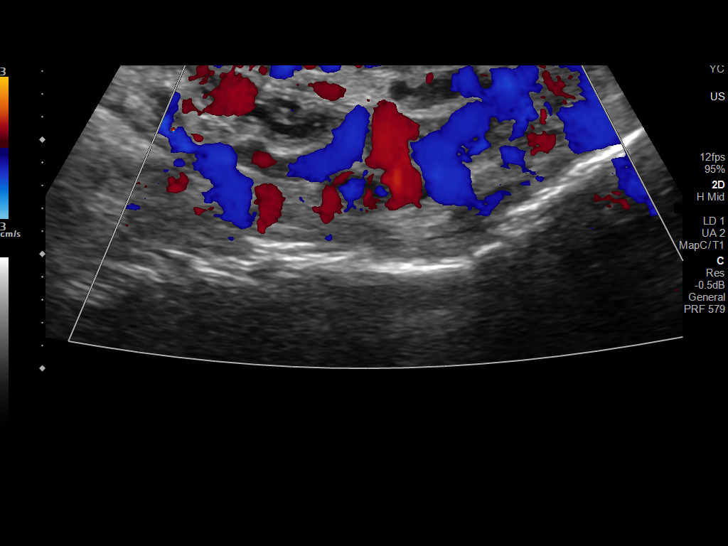
[im 23/40]
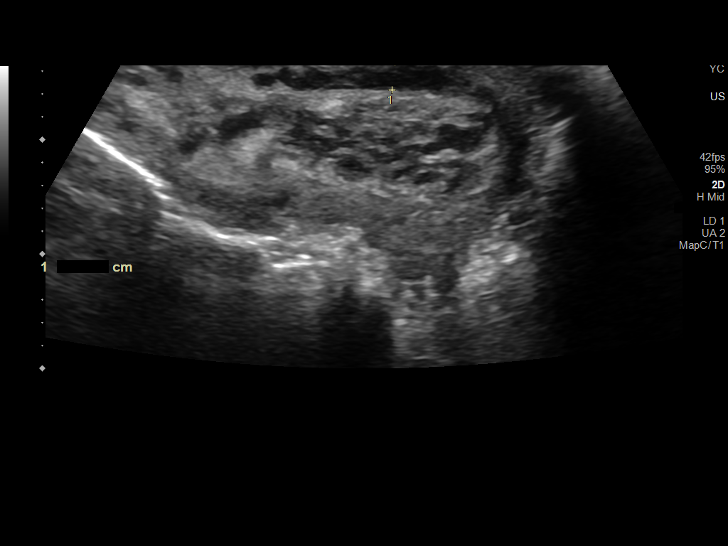
[im 27/40]
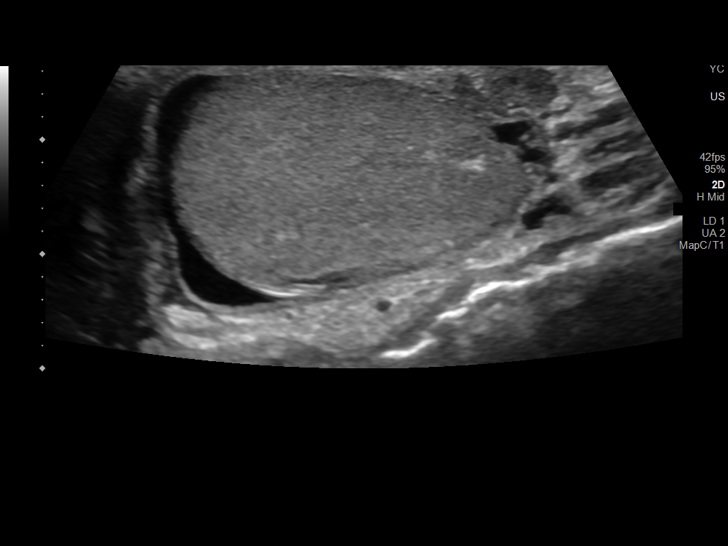
[im 30/40]
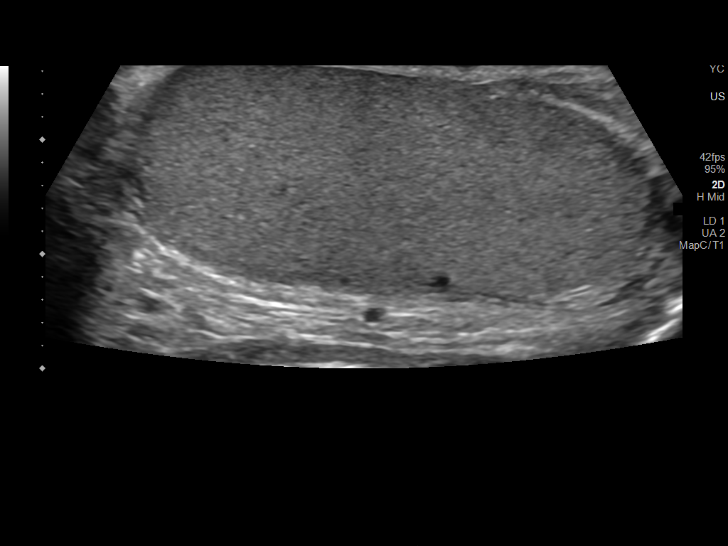
[im 33/40]
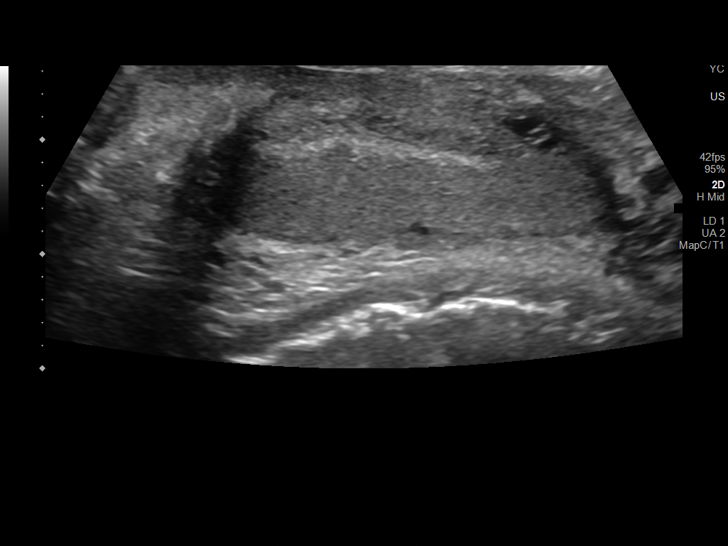
[im 36/40]
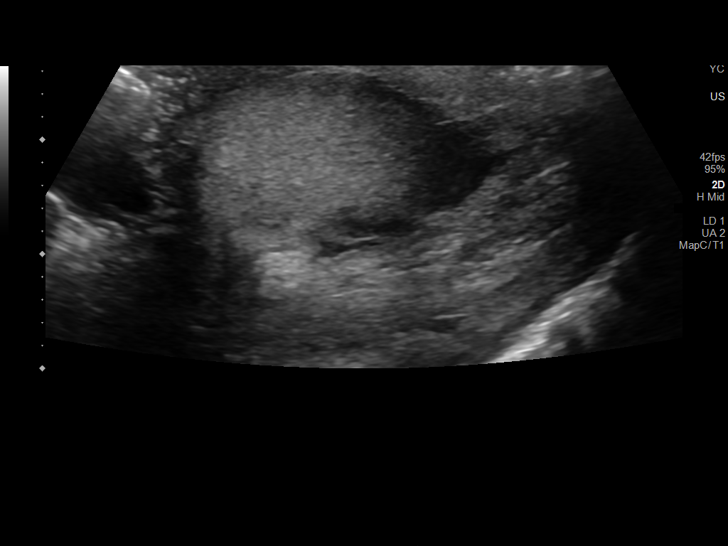
[im 40/40]
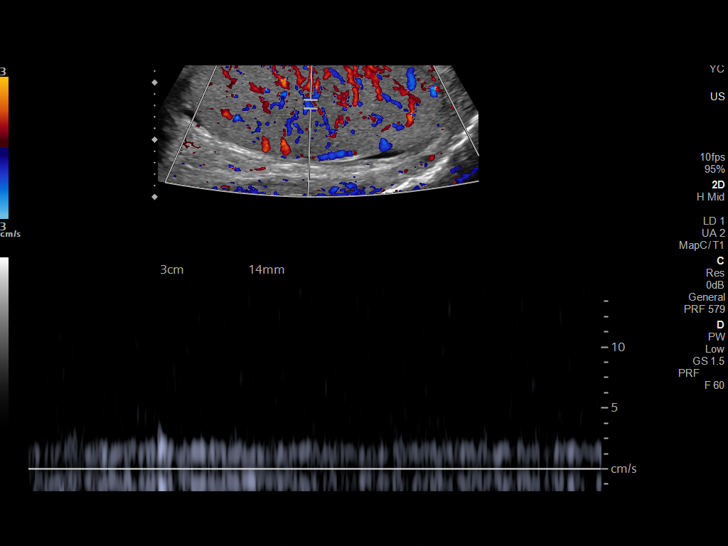

[13 of 25 positions shown; findings below may reference images not displayed]

FINDINGS: Right testicle

Surgically absent

Left testicle

Measurements: 4.5 x 2.1 x 2.8 cm. Normal testicular parenchyma.
Slightly prominent color Doppler flow. No testicular mass or
torsion.

Right epididymis: Surgically absent

Left epididymis: Question some slight heterogeneity with prominent
color flow. Otherwise normal size and appearance.

Hydrocele: Small left hydrocele. No concerning septation, debris or
other features of pyocele.

Varicocele:  Left varicocele is present.

Pulsed Doppler interrogation of the left testis demonstrates normal
low resistance arterial and venous waveforms bilaterally.
IMPRESSION: 1. Question slight heterogeneity of the left epididymis with
prominent color Doppler flow to both the epididymis and testis.
Could reflect a mild epididymo-orchitis.
2. Small left hydrocele and varicocele. No concerning features of
pyocele.
3. Prior right orchiectomy.

## 2021-01-13 ENCOUNTER — Other Ambulatory Visit: Payer: Self-pay

## 2021-01-13 DIAGNOSIS — B009 Herpesviral infection, unspecified: Secondary | ICD-10-CM

## 2021-01-13 MED ORDER — VALACYCLOVIR HCL 500 MG PO TABS: 500.0000 mg | ORAL_TABLET | Freq: Every day | ORAL | 1 refills | Status: DC

## 2021-01-17 ENCOUNTER — Other Ambulatory Visit: Payer: Self-pay | Admitting: Family Medicine

## 2021-01-18 ENCOUNTER — Encounter: Payer: Self-pay | Admitting: Family Medicine

## 2021-01-25 ENCOUNTER — Telehealth: Payer: Managed Care, Other (non HMO) | Admitting: Family Medicine

## 2021-01-25 VITALS — BP 128/78 | Temp 97.5°F | Wt 179.0 lb

## 2021-01-25 DIAGNOSIS — U071 COVID-19: Secondary | ICD-10-CM

## 2021-01-25 DIAGNOSIS — J019 Acute sinusitis, unspecified: Secondary | ICD-10-CM | POA: Diagnosis not present

## 2021-01-25 MED ORDER — AMOXICILLIN-POT CLAVULANATE 875-125 MG PO TABS: 1.0000 | ORAL_TABLET | Freq: Two times a day (BID) | ORAL | 0 refills | Status: DC

## 2021-01-25 NOTE — Patient Instructions (Addendum)
Saline nasal spray, drink plenty of fluids, Augmentin sent to your pharmacy.  Hope you feel better soon!  Sinusitis, Adult Sinusitis is inflammation of your sinuses. Sinuses are hollow spaces in the bones around your face. Your sinuses are located: Around your eyes. In the middle of your forehead. Behind your nose. In your cheekbones. Mucus normally drains out of your sinuses. When your nasal tissues become inflamed or swollen, mucus can become trapped or blocked. This allows bacteria, viruses, and fungi to grow, which leads to infection. Most infections of the sinuses are caused by a virus. Sinusitis can develop quickly. It can last for up to 4 weeks (acute) or for more than 12 weeks (chronic). Sinusitis often develops after a cold. What are the causes? This condition is caused by anything that creates swelling in the sinuses or stops mucus from draining. This includes: Allergies. Asthma. Infection from bacteria or viruses. Deformities or blockages in your nose or sinuses. Abnormal growths in the nose (nasal polyps). Pollutants, such as chemicals or irritants in the air. Infection from fungi (rare). What increases the risk? You are more likely to develop this condition if you: Have a weak body defense system (immune system). Do a lot of swimming or diving. Overuse nasal sprays. Smoke. What are the signs or symptoms? The main symptoms of this condition are pain and a feeling of pressure around the affected sinuses. Other symptoms include: Stuffy nose or congestion. Thick drainage from your nose. Swelling and warmth over the affected sinuses. Headache. Upper toothache. A cough that may get worse at night. Extra mucus that collects in the throat or the back of the nose (postnasal drip). Decreased sense of smell and taste. Fatigue. A fever. Sore throat. Bad breath. How is this diagnosed? This condition is diagnosed based on: Your symptoms. Your medical history. A physical  exam. Tests to find out if your condition is acute or chronic. This may include: Checking your nose for nasal polyps. Viewing your sinuses using a device that has a light (endoscope). Testing for allergies or bacteria. Imaging tests, such as an MRI or CT scan. In rare cases, a bone biopsy may be done to rule out more serious types of fungal sinus disease. How is this treated? Treatment for sinusitis depends on the cause and whether your condition is chronic or acute. If caused by a virus, your symptoms should go away on their own within 10 days. You may be given medicines to relieve symptoms. They include: Medicines that shrink swollen nasal passages (topical intranasal decongestants). Medicines that treat allergies (antihistamines). A spray that eases inflammation of the nostrils (topical intranasal corticosteroids). Rinses that help get rid of thick mucus in your nose (nasal saline washes). If caused by bacteria, your health care provider may recommend waiting to see if your symptoms improve. Most bacterial infections will get better without antibiotic medicine. You may be given antibiotics if you have: A severe infection. A weak immune system. If caused by narrow nasal passages or nasal polyps, you may need to have surgery. Follow these instructions at home: Medicines Take, use, or apply over-the-counter and prescription medicines only as told by your health care provider. These may include nasal sprays. If you were prescribed an antibiotic medicine, take it as told by your health care provider. Do not stop taking the antibiotic even if you start to feel better. Hydrate and humidify  Drink enough fluid to keep your urine pale yellow. Staying hydrated will help to thin your mucus. Use a cool  mist humidifier to keep the humidity level in your home above 50%. Inhale steam for 10-15 minutes, 3-4 times a day, or as told by your health care provider. You can do this in the bathroom while a hot  shower is running. Limit your exposure to cool or dry air. Rest Rest as much as possible. Sleep with your head raised (elevated). Make sure you get enough sleep each night. General instructions  Apply a warm, moist washcloth to your face 3-4 times a day or as told by your health care provider. This will help with discomfort. Wash your hands often with soap and water to reduce your exposure to germs. If soap and water are not available, use hand sanitizer. Do not smoke. Avoid being around people who are smoking (secondhand smoke). Keep all follow-up visits as told by your health care provider. This is important. Contact a health care provider if: You have a fever. Your symptoms get worse. Your symptoms do not improve within 10 days. Get help right away if: You have a severe headache. You have persistent vomiting. You have severe pain or swelling around your face or eyes. You have vision problems. You develop confusion. Your neck is stiff. You have trouble breathing. Summary Sinusitis is soreness and inflammation of your sinuses. Sinuses are hollow spaces in the bones around your face. This condition is caused by nasal tissues that become inflamed or swollen. The swelling traps or blocks the flow of mucus. This allows bacteria, viruses, and fungi to grow, which leads to infection. If you were prescribed an antibiotic medicine, take it as told by your health care provider. Do not stop taking the antibiotic even if you start to feel better. Keep all follow-up visits as told by your health care provider. This is important. This information is not intended to replace advice given to you by your health care provider. Make sure you discuss any questions you have with your health care provider. Document Revised: 09/11/2017 Document Reviewed: 09/11/2017 Elsevier Patient Education  2022 [employer].

## 2021-01-25 NOTE — Progress Notes (Signed)
Virtual Visit via Video Note  I connected with Jon Richardson. on 01/25/21 at 12:30 PM by a video enabled telemedicine application and verified that I am speaking with the correct person using two identifiers.  Patient location: home  My location: office - [facility]   I discussed the limitations, risks, security and privacy concerns of performing an evaluation and management service by telephone and the availability of in person appointments. I also discussed with the patient that there may be a patient responsible charge related to this service. The patient expressed understanding and agreed to proceed, consent obtained  Chief complaint:  Chief Complaint  Patient presents with   Nasal Congestion    Pt had COVID Positive 01/16/2021, pt reports still positive but no other sxs other than congestion /headache, had clear mucous previous to yesterday but now dark yellow, no fever in last 5 days, pt did not take paxlovid      History of Present Illness: Jon Richardson. is a 60 y.o. male  Covid infection: Positive test 01/16/21, treated with otc meds, including Afrin 1 spray at night. No antiviral.  Overall improving except sinus/congestion.  No CP or dyspnea. No confusion, drinking fluids.  No fever in past week.  Persistent nasal congestion, more discolored recently with some frontal headache.  Hx of sinus infections.    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   BPH (benign prostatic hyperplasia) 07/22/2008   Back  pain-chronic-status post surgery 07/22/2008   GERD 11/12/2007   Anxiety-   07/11/2007   PERSISTENT DISORDER INITIATING/MAINTAINING SLEEP 05/30/2007   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 Delice Lesch   Other and unspecified hyperlipidemia    Other chronic sinusitis    Other testicular hypofunction    PANIC ATTACK, ACUTE    Past Surgical History:  Procedure Laterality Date   BACK SURGERY  2008   birth mark removal     as baby   COLONOSCOPY     GANGLION CYST EXCISION     finger   HERNIA REPAIR     Inguinal   ORCHIECTOMY Right ~1995   benign   SPINE SURGERY     TONSILLECTOMY     Allergies  Allergen Reactions   Erythromycin Diarrhea    abd cramps   Prior to Admission medications   Medication Sig Start Date End Date Taking? Authorizing Provider  Alpha-D-Galactosidase (BEANO) TABS Take by mouth as needed.   Yes [provider]  aspirin 81 MG tablet Take 81 mg by mouth daily.   Yes [provider]  ezetimibe (ZETIA) 10 MG tablet TAKE ONE TABLET BY MOUTH DAILY 09/02/20  Yes Martinique, Peter M, MD  famotidine (PEPCID) 40 MG tablet TAKE ONE TABLET BY MOUTH DAILY 10/16/20  Yes Midge Minium, MD  fenofibrate 160 MG tablet TAKE ONE TABLET BY MOUTH DAILY 01/18/21  Yes Midge Minium, MD  fluticasone (FLONASE) 50 MCG/ACT nasal spray Place 2 sprays into both nostrils daily. 12/01/15  Yes  Midge Minium, MD  pantoprazole (PROTONIX) 40 MG tablet Take 1 tablet (40 mg total) by mouth 2 (two) times daily. 06/08/20  Yes Midge Minium, MD  sucralfate (CARAFATE) 1 g tablet TAKE ONE TABLET BY MOUTH FOUR TIMES A DAY WITH MEALS AND AT BEDTIME 06/29/20  Yes Midge Minium, MD  valACYclovir (VALTREX) 500 MG tablet Take 1 tablet (500 mg total) by mouth daily. 01/13/21  Yes Midge Minium, MD  Zinc 50 MG CAPS Take by mouth.   Yes [provider]   Social History   Socioeconomic History   Marital status: Single    Spouse name: Not on file   Number of children: 0   Years of education: HS   Highest education level: Not on file  Occupational History   Occupation: Economist   Tobacco Use   Smoking status: Never   Smokeless tobacco: Never  Vaping Use   Vaping Use: Never used  Substance and Sexual Activity   Alcohol use: Yes    Alcohol/week: 0.0 standard drinks    Comment: Approximately once a month   Drug use: No   Sexual activity: Yes  Other Topics Concern   Not on file  Social History Narrative   Lives w/ partner Arville Go.   Right-handed.   2 cups coffee daily.   Social Determinants of Health   Financial Resource Strain: Not on file  Food Insecurity: Not on file  Transportation Needs: Not on file  Physical Activity: Not on file  Stress: Not on file  Social Connections: Not on file  Intimate Partner Violence: Not on file    Observations/Objective: Vitals:   01/25/21 1129  BP: 128/78  Temp: (!) 97.5 F (36.4 C)  Nontoxic appearance on video, appropriate responses, no respiratory distress, speaking in full sentences.  All questions were answered with understanding of plan expressed.  Assessment and Plan: COVID-19 virus infection  Acute sinusitis, recurrence not specified, unspecified location - Plan: amoxicillin-clavulanate (AUGMENTIN) 875-125 MG tablet Overall improving COVID-19 symptoms, now with some increased sinus symptoms,  more discolored nasal discharge, headache.  Secondary sinusitis likely, start Augmentin, potential side effects and risk discussed, saline nasal spray, and discontinue Afrin at this point.  RTC precautions discussed.  Quarantine/masking precautions discussed and timing based on CDC guidelines.  Follow Up Instructions:  As needed I discussed the assessment and treatment plan with the patient. The patient was provided an opportunity to ask questions and all were answered. The patient agreed with the plan and demonstrated an understanding of the instructions.   The patient was advised to call back or seek an in-person evaluation if the symptoms worsen or if the condition fails to improve as anticipated.  Wendie Agreste, MD

## 2021-02-02 ENCOUNTER — Other Ambulatory Visit: Payer: Self-pay | Admitting: Family Medicine

## 2021-02-02 DIAGNOSIS — K219 Gastro-esophageal reflux disease without esophagitis: Secondary | ICD-10-CM

## 2021-02-17 ENCOUNTER — Other Ambulatory Visit: Payer: Self-pay | Admitting: Family Medicine

## 2021-02-26 ENCOUNTER — Other Ambulatory Visit: Payer: Self-pay

## 2021-02-26 DIAGNOSIS — K219 Gastro-esophageal reflux disease without esophagitis: Secondary | ICD-10-CM

## 2021-02-26 DIAGNOSIS — B009 Herpesviral infection, unspecified: Secondary | ICD-10-CM

## 2021-02-26 MED ORDER — VALACYCLOVIR HCL 500 MG PO TABS: 500.0000 mg | ORAL_TABLET | Freq: Every day | ORAL | 0 refills | Status: AC

## 2021-02-26 MED ORDER — FAMOTIDINE 40 MG PO TABS: 40.0000 mg | ORAL_TABLET | Freq: Every day | ORAL | 0 refills | Status: AC

## 2021-02-26 MED ORDER — PANTOPRAZOLE SODIUM 40 MG PO TBEC: 40.0000 mg | DELAYED_RELEASE_TABLET | Freq: Two times a day (BID) | ORAL | 1 refills | Status: AC

## 2021-03-16 ENCOUNTER — Ambulatory Visit
Admission: RE | Admit: 2021-03-16 | Discharge: 2021-03-16 | Disposition: A | Discharge status: Home / Self Care | Payer: Managed Care, Other (non HMO) | Source: Ambulatory Visit | Attending: Internal Medicine | Admitting: Internal Medicine

## 2021-03-16 ENCOUNTER — Other Ambulatory Visit: Payer: Self-pay | Admitting: Internal Medicine

## 2021-03-16 DIAGNOSIS — M25551 Pain in right hip: Secondary | ICD-10-CM

## 2021-03-16 DIAGNOSIS — M79604 Pain in right leg: Secondary | ICD-10-CM

## 2021-04-01 ENCOUNTER — Other Ambulatory Visit: Payer: Self-pay | Admitting: Sports Medicine

## 2021-04-01 DIAGNOSIS — M79651 Pain in right thigh: Secondary | ICD-10-CM

## 2021-07-26 ENCOUNTER — Telehealth: Payer: Self-pay | Admitting: Gastroenterology

## 2021-07-26 ENCOUNTER — Encounter: Payer: Self-pay | Admitting: Physician Assistant

## 2021-07-26 NOTE — Telephone Encounter (Signed)
Patient called in with complaints of diarrhea for the last three weeks. He has a history of IBS, and is currently being seen by Dr. Koleen Nimrod with [facility]. He was referred to [place] & currently has a new patient appointment with Estill Bamberg, [place] on 08/16/21 and has been added to a wait list in case there are any sooner openings. He is aware that in the meantime he needs to speak with his primary GI physician until he can be seen here, or if symptoms worsen then he can be seen at urgent care/ED.  ?

## 2021-07-26 NOTE — Telephone Encounter (Signed)
Inbound call from patient wanting to discuss appointment with nurse. Please advise.  ?

## 2021-08-13 NOTE — Progress Notes (Signed)
? ? ? ?08/16/2021 ?[employer]. ?[id number] ?05/29/1960 ? ?Referring provider: Midge Minium, MD ?Primary GI doctor: Dr. Fuller Plan ? ?ASSESSMENT AND PLAN:  ? ?Diarrhea of presumed infectious origin ?Negative O&P, positive fecal calprotectin, did not get Cdiff or GI pathogen.  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  ?FODMAP given, get on IBGARD, benefiber ?If Cdiff is negative will schedule colonoscopy sooner to evaluate.  ?-     CBC with Differential/Platelet; Future ?-     Comprehensive metabolic panel ?-     TSH; Future ?-     IgA; Future ?-     Tissue transglutaminase, IgA; Future ?-     C-reactive protein; Future ?-     Clostridium difficile Toxin B, Qualitative, Real-Time PCR; Future ? ?Abdominal bloating ?-     IgA; Future ?-     Tissue transglutaminase, IgA; Future ? ? ?Patient Care Team: ?Midge Minium, MD as PCP - General (Family Medicine) ?Ladene Artist, MD as Consulting Physician (Gastroenterology) ?Kerry Fort., MD as Consulting Physician (Urology) ? ?HISTORY OF PRESENT ILLNESS: ?61 y.o. male with a past medical history of melanoma, hyperlipidemia, IBS , anxiety, GERD, fatty liver and others listed below presents for evaluation of constipation.  ? ?04/09/2018 seen by Dr. Fuller Plan for reflux bloating.   ?Patient given Protonix a.m. and famotidine 40 mg p.m.  Told to try Gas-X 4 times daily and dicyclomine. ? ?Had positive fecal cal protectin with PCP.  ?Last year went to urology for persistent lower AB pain, had CT AB and pelvis 07/24/2020. ?Lower AB pain worse with movement first thing in AM, intensity decreases after BM. If he raises up and uses AB wall will sit up and have similar lower AB pain.  ?March had low grade temperature for 3 days during that time had watery diarrhea, with fatigue. Tested for flu/covid, no sick contacts, did have some ABX prior to that, Augmentin.  ?Had negative ova and parasite, not checked for Cdiff  or GI pathogen, and had positive fecal cal protectin, unable to see the number.  ?Started IBS clear for IBS and diarrhea, went off of dairy so his stools have helped some. Has less gas, more regular stools.  ?Have BM's about 3 a day, have had some formed stools.  ? ? ?Continue to have lower AB sensation, can wake him up in the AM until he goes to the restroom, does improve after BM.  ?Has ventral hernia and umblical hernia, has follow up with general surgery that did his adrenal surgery with that.  ? ?CT AB and pelvis with and without contrast at [facility] ?IMPRESSION:  ?1.  No acute process or explanation for left-sided pain.  ?2. 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  ?3. Jejunal mesenteric findings which are similar and could represent  ?mesenteric adenitis/panniculitis.  ?4. Coronary artery atherosclerosis. Aortic Atherosclerosis  ?(ICD10-I70.0).  ? ?EGD 06/2016 ?- Normal esophagus. ?- Non-bleeding, mild erosive gastropathy. Biopsied. ?- Normal duodenal bulb and second portion of the duodenum ? ?Colonoscopy 01/11/2013 showed diverticulosis sigmoid only due 12/2022 ? ?Current Medications:  ? ? ?Current Outpatient Medications (Cardiovascular):  ?  ezetimibe (ZETIA) 10 MG tablet, TAKE ONE TABLET BY MOUTH DAILY ?  fenofibrate 160 MG tablet, TAKE ONE TABLET BY MOUTH DAILY ? ?Current Outpatient Medications (Respiratory):  ?  fluticasone (FLONASE) 50 MCG/ACT nasal spray, Place 2 sprays into both nostrils daily. ? ?Current Outpatient Medications (Analgesics):  ?  aspirin 81 MG tablet, Take 81 mg by mouth daily. ? ? ?Current Outpatient Medications (Other):  ?  Alpha-D-Galactosidase (BEANO) TABS, Take by mouth as needed. ?  famotidine (PEPCID) 40 MG tablet, Take 1 tablet (40 mg total) by mouth daily. ?  Nutritional Supplements (IBS SUPPORT PO), Take by mouth. IBS Clear.. OTC herbal supplement. ?  pantoprazole (PROTONIX) 40 MG tablet, Take 1 tablet (40 mg total) by mouth 2 (two) times daily. ?  valACYclovir  (VALTREX) 500 MG tablet, Take 1 tablet (500 mg total) by mouth daily. ?  Zinc 50 MG CAPS, Take by mouth. ? ?Medical History:  ?Past Medical History:  ?Diagnosis Date  ? Anxiety   ? BCC (basal cell carcinoma of skin) 03-2013  ? scalp  ? BPH (benign prostatic hyperplasia)   ? Cervical pain (neck)   ? Diverticulosis   ? Fatty liver   ? GERD   ? Headache   ? HERPES SIMPLEX, UNCOMPLICATED   ? History of exercise stress test   ? ETT 3/18: Ex 10'02", normal BP response, no ST changes; neg adequate ETT  ? Irritable bowel syndrome (IBS)   ? WITH DIARRHEA  ? LUMBAR RADICULOPATHY, LEFT   ? 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 On lateral right thigh, Dr. Delice Lesch  ? Other and unspecified hyperlipidemia   ? Other chronic sinusitis   ? Other testicular hypofunction   ? PANIC ATTACK, ACUTE   ? ?Allergies:  ?Allergies  ?Allergen Reactions  ? Erythromycin Diarrhea  ?  abd cramps  ?  ? ?Surgical History:  ?He  has a past surgical history that includes Hernia repair; Orchiectomy (Right, ~1995); Back surgery (2008); Ganglion cyst excision; Colonoscopy; Tonsillectomy; birth mark removal; and Spine surgery. ?Family History:  ?His family history includes Cancer in his maternal uncle; Colon cancer in his paternal grandmother; Heart attack in his mother and paternal grandfather; Heart disease in his maternal grandmother and mother; Lung cancer in his father; Prostate cancer in his father and paternal uncle. ?Social History:  ? reports that he has never smoked. He has never used smokeless tobacco. He reports current alcohol use. He reports that he does not use drugs. ? ?REVIEW OF SYSTEMS  : All other systems reviewed and negative except where noted in the History of Present Illness. ? ? ?PHYSICAL EXAM: ?BP 130/78   Pulse 68   Ht '5\' 7"'$  (1.702 m)   Wt 173 lb 12.8 oz (78.8 kg)   BMI 27.22 kg/m?  ?General:   Pleasant, well developed male in no acute distress ?Head:   Normocephalic and atraumatic. ?Eyes:  sclerae anicteric,conjunctive pink  ?Heart:    regular rate and rhythm ?Pulm:  Clear anteriorly; no wheezing ?Abdomen:   Soft, Obese AB, with ventral 2 finger hernia, no other hernias appreciated, Active bowel sounds. mild tenderness in the lower abdomen. Without guarding and Without rebound, No organomegaly appreciated. ?Extremities:  Without edema. ?Msk: Symmetrical without gross deformities. Peripheral pulses intact.  ?Neurologic:  Alert and  oriented x4;  No focal deficits.  ?Skin:   Dry and intact without significant lesions or rashes. ?Psychiatric:  Cooperative. Normal mood and affect. ? ? ? ?Vladimir Crofts, PA-C ?10:05 AM ? ? ?

## 2021-08-16 ENCOUNTER — Other Ambulatory Visit (INDEPENDENT_AMBULATORY_CARE_PROVIDER_SITE_OTHER): Payer: Managed Care, Other (non HMO)

## 2021-08-16 ENCOUNTER — Ambulatory Visit: Payer: Managed Care, Other (non HMO) | Admitting: Physician Assistant

## 2021-08-16 ENCOUNTER — Encounter: Payer: Self-pay | Admitting: Podiatry

## 2021-08-16 VITALS — BP 130/78 | HR 68 | Ht 67.0 in | Wt 173.8 lb

## 2021-08-16 DIAGNOSIS — R140 Abdominal distension (gaseous): Secondary | ICD-10-CM

## 2021-08-16 DIAGNOSIS — R197 Diarrhea, unspecified: Secondary | ICD-10-CM | POA: Diagnosis not present

## 2021-08-16 LAB — CBC WITH DIFFERENTIAL/PLATELET
Basophils Absolute: 0 10*3/uL (ref 0.0–0.1)
Basophils Relative: 0.9 % (ref 0.0–3.0)
Eosinophils Absolute: 0.1 10*3/uL (ref 0.0–0.7)
Eosinophils Relative: 1.7 % (ref 0.0–5.0)
HCT: 46.2 % (ref 39.0–52.0)
Hemoglobin: 15.2 g/dL (ref 13.0–17.0)
Lymphocytes Relative: 35.6 % (ref 12.0–46.0)
Lymphs Abs: 1.8 10*3/uL (ref 0.7–4.0)
MCHC: 33 g/dL (ref 30.0–36.0)
MCV: 93.6 fl (ref 78.0–100.0)
Monocytes Absolute: 0.5 10*3/uL (ref 0.1–1.0)
Monocytes Relative: 9.4 % (ref 3.0–12.0)
Neutro Abs: 2.7 10*3/uL (ref 1.4–7.7)
Neutrophils Relative %: 52.4 % (ref 43.0–77.0)
Platelets: 323 10*3/uL (ref 150.0–400.0)
RBC: 4.93 Mil/uL (ref 4.22–5.81)
RDW: 14.4 % (ref 11.5–15.5)
WBC: 5.1 10*3/uL (ref 4.0–10.5)

## 2021-08-16 LAB — C-REACTIVE PROTEIN: CRP: <1 mg/dL (ref 0.5–20.0)

## 2021-08-16 LAB — TSH: TSH: 3.23 u[IU]/mL (ref 0.35–5.50)

## 2021-08-16 NOTE — Patient Instructions (Addendum)
Look up diastasis recti exercises to do ?Avoid sit ups ? ?Can be post infectious IBS, look at diet, can try IBGARD ?Will check Cdiff, if cdiff negative will schedule for colonoscopy.  ? ?Please try low FODMAP diet ?Try trial off milk/lactose products.  ?Add fiber like benefiber or citracel once a day ?Increase activity ?Can do trial of IBGard for AB pain- Take 1-2 capsules once a day for maintence or twice a day during a flare ?Please try to decrease stress. ?if any worsening symptoms like blood in stool, weight loss, please call the office or go to the ER.  ? ?Can try to do BENEFIBER/CITRACEL ? ?Irritable Bowel Syndrome, Adult ?Irritable bowel syndrome (IBS) is a group of symptoms that affects the organs responsible for digestion (gastrointestinal or GI tract). IBS is not one specific disease. ?To regulate how the GI tract works, the body sends signals back and forth between the intestines and the brain. If you have IBS, there may be a problem with these signals. As a result, the GI tract does not function normally. The intestines may become more sensitive and overreact to certain things. This may be especially true when you eat certain foods or when you are under stress. ?There are four types of IBS. These may be determined based on the consistency of your stool (feces): ?IBS with diarrhea. ?IBS with constipation. ?Mixed IBS. ?Unsubtyped IBS. ?It is important to know which type of IBS you have. Certain treatments are more likely to be helpful for certain types of IBS. ?What are the causes? ?The exact cause of IBS is not known. ?What increases the risk? ?You may have a higher risk for IBS if you: ?Are male. ?Are younger than 33. ?Have a family history of IBS. ?Have a mental health condition, such as depression, anxiety, or post-traumatic stress disorder. ?Have had a bacterial infection of your GI tract. ?What are the signs or symptoms? ?Symptoms of IBS vary from person to person. The main symptom is abdominal  pain or discomfort. Other symptoms usually include one or more of the following: ?Diarrhea, constipation, or both. ?Abdominal swelling or bloating. ?Feeling full after eating a small or regular-sized meal. ?Frequent gas. ?Mucus in the stool. ?A feeling of having more stool left after a bowel movement. ?Symptoms tend to come and go. They may be triggered by stress, mental health conditions, or certain foods. ?How is this diagnosed? ?This condition may be diagnosed based on a physical exam, your medical history, and your symptoms. You may have tests, such as: ?Blood tests. ?Stool test. ?X-rays. ?CT scan. ?Colonoscopy. This is a procedure in which your GI tract is viewed with a long, thin, flexible tube. ?How is this treated? ?There is no cure for IBS, but treatment can help relieve symptoms. Treatment depends on the type of IBS you have, and may include: ?Changes to your diet, such as: ?Avoiding foods that cause symptoms. ?Drinking more water. ?Following a low-FODMAP (fermentable oligosaccharides, disaccharides, monosaccharides, and polyols) diet for up to 6 weeks, or as told by your health care provider. FODMAPs are sugars that are hard for some people to digest. ?Eating more fiber. ?Eating medium-sized meals at the same times every day. ?Medicines. These may include: ?Fiber supplements, if you have constipation. ?Medicine to control diarrhea (antidiarrheal medicines). ?Medicine to help control muscle tightening (spasms) in your GI tract (antispasmodic medicines). ?Medicines to help with mental health conditions, such as antidepressants or tranquilizers. ?Talk therapy or counseling. ?Working with a diet and nutrition specialist (dietitian) to  help create a food plan that is right for you. ?Managing your stress. ?Follow these instructions at home: ?Eating and drinking ?Eat a healthy diet. ?Eat medium-sized meals at about the same time every day. Do not eat large meals. ?Gradually eat more fiber-rich foods. These  include whole grains, fruits, and vegetables. This may be especially helpful if you have IBS with constipation. ?Eat a diet low in FODMAPs. ?Drink enough fluid to keep your urine pale yellow. ?Keep a journal of foods that seem to trigger symptoms. ?Avoid foods and drinks that: ?Contain added sugar. ?Make your symptoms worse. Dairy products, caffeinated drinks, and carbonated drinks can make symptoms worse for some people. ?General instructions ?Take over-the-counter and prescription medicines and supplements only as told by your health care provider. ?Get enough exercise. Do at least 150 minutes of moderate-intensity exercise each week. ?Manage your stress. Getting enough sleep and exercise can help you manage stress. ?Keep all follow-up visits as told by your health care provider and therapist. This is important. ?Alcohol Use ?Do not drink alcohol if: ?Your health care provider tells you not to drink. ?You are pregnant, may be pregnant, or are planning to become pregnant. ?If you drink alcohol, limit how much you have: ?0-1 drink a day for women. ?0-2 drinks a day for men. ?Be aware of how much alcohol is in your drink. In the [place] one drink equals one typical bottle of beer (12 oz), one-half glass of wine (5 oz), or one shot of hard liquor (1? oz). ?Contact a health care provider if you have: ?Constant pain. ?Weight loss. ?Difficulty or pain when swallowing. ?Diarrhea that gets worse. ?Get help right away if you have: ?Severe abdominal pain. ?Fever. ?Diarrhea with symptoms of dehydration, such as dizziness or dry mouth. ?Bright red blood in your stool. ?Stool that is black and tarry. ?Abdominal swelling. ?Vomiting that does not stop. ?Blood in your vomit. ?Summary ?Irritable bowel syndrome (IBS) is not one specific disease. It is a group of symptoms that affects digestion. ?Your intestines may become more sensitive and overreact to certain things. This may be especially true when you eat certain foods or when you  are under stress. ?There is no cure for IBS, but treatment can help relieve symptoms. ?This information is not intended to replace advice given to you by your health care provider. Make sure you discuss any questions you have with your health care provider. ?Document Revised: 12/12/2019 Document Reviewed: 12/12/2019 ?Elsevier Patient Education ? 2022 [place]. ? ? ? ? ?Diverticulosis ?Diverticulosis is a condition that develops when small pouches (diverticula) form in the wall of the large intestine (colon). The colon is where water is absorbed and stool (feces) is formed. The pouches form when the inside layer of the colon pushes through weak spots in the outer layers of the colon. You may have a few pouches or many of them. ?The pouches usually do not cause problems unless they become inflamed or infected. When this happens, the condition is called diverticulitis- this is left lower quadrant pain, diarrhea, fever, chills, nausea or vomiting.  If this occurs please call the office or go to the hospital. ?Sometimes these patches without inflammation can also have painless bleeding associated with them, if this happens please call the office or go to the hospital. ?Preventing constipation and increasing fiber can help reduce diverticula and prevent complications. ?Even if you feel you have a high-fiber diet, suggest getting on Benefiber 2-3 times daily. ?What increases the risk? ?The following factors  may make you more likely to develop this condition: ?Being older than age 32. Your risk for this condition increases with age. Diverticulosis is rare among people younger than age 13. By age 55, many people have it. ?Eating a low-fiber diet. ?Having frequent constipation. ?Being overweight. ?Not getting enough exercise. ?Smoking. ?Taking over-the-counter pain medicines, like aspirin and ibuprofen.- can increase a flare.  ?Having a family history of diverticulosis. ?How is this diagnosed? ?Because diverticulosis  usually has no symptoms, it is most often diagnosed during an exam for other colon problems. The condition may be diagnosed by: ?Using a flexible scope to examine the colon (colonoscopy). ?Taking an X-ray of the

## 2021-08-16 NOTE — Telephone Encounter (Signed)
Please advise 

## 2021-08-17 ENCOUNTER — Other Ambulatory Visit: Payer: Managed Care, Other (non HMO)

## 2021-08-17 DIAGNOSIS — R197 Diarrhea, unspecified: Secondary | ICD-10-CM

## 2021-08-17 LAB — IGA: Immunoglobulin A: 256 mg/dL (ref 70–320)

## 2021-08-17 LAB — TISSUE TRANSGLUTAMINASE, IGA: (tTG) Ab, IgA: <1 U/mL

## 2021-08-18 LAB — CLOSTRIDIUM DIFFICILE TOXIN B, QUALITATIVE, REAL-TIME PCR: Toxigenic C. Difficile by PCR: NOT DETECTED

## 2021-09-22 ENCOUNTER — Telehealth: Payer: Self-pay | Admitting: Physician Assistant

## 2021-09-22 NOTE — Telephone Encounter (Signed)
Patient called states he has not heard from anyone regarding what his next treatment plan is seeking a call from a nurse.

## 2021-09-22 NOTE — Telephone Encounter (Signed)
Patient called in and would like to follow up to see what next steps would be for his plan of care. He was last seen for OV on 08/16/21 with Estill Bamberg, [place] and was told that if all test were negative then he would be scheduled for a colonoscopy. Will route to PA.

## 2021-09-22 NOTE — Telephone Encounter (Signed)
Spoke with patient regarding PA recommendations. At this time he would like to hold off on colonoscopy since he is not due until 12/2022. He says he believes his symptoms have started to subside since taking dairy out of his diet. Patient advised to call back if symptoms worsen or has any other questions.

## 2022-01-28 ENCOUNTER — Other Ambulatory Visit: Payer: Self-pay | Admitting: Internal Medicine

## 2022-01-28 DIAGNOSIS — M5441 Lumbago with sciatica, right side: Secondary | ICD-10-CM

## 2022-02-04 IMAGING — CR DG FEMUR 2+V*R*
4 series · 4 of 4 positions shown · non-contrast
Comparison: None.

CLINICAL DATA: Pain mid right femur, no injury. Right leg pain

EXAM:
RIGHT FEMUR 2 VIEWS

[t femur with hip  ap right]
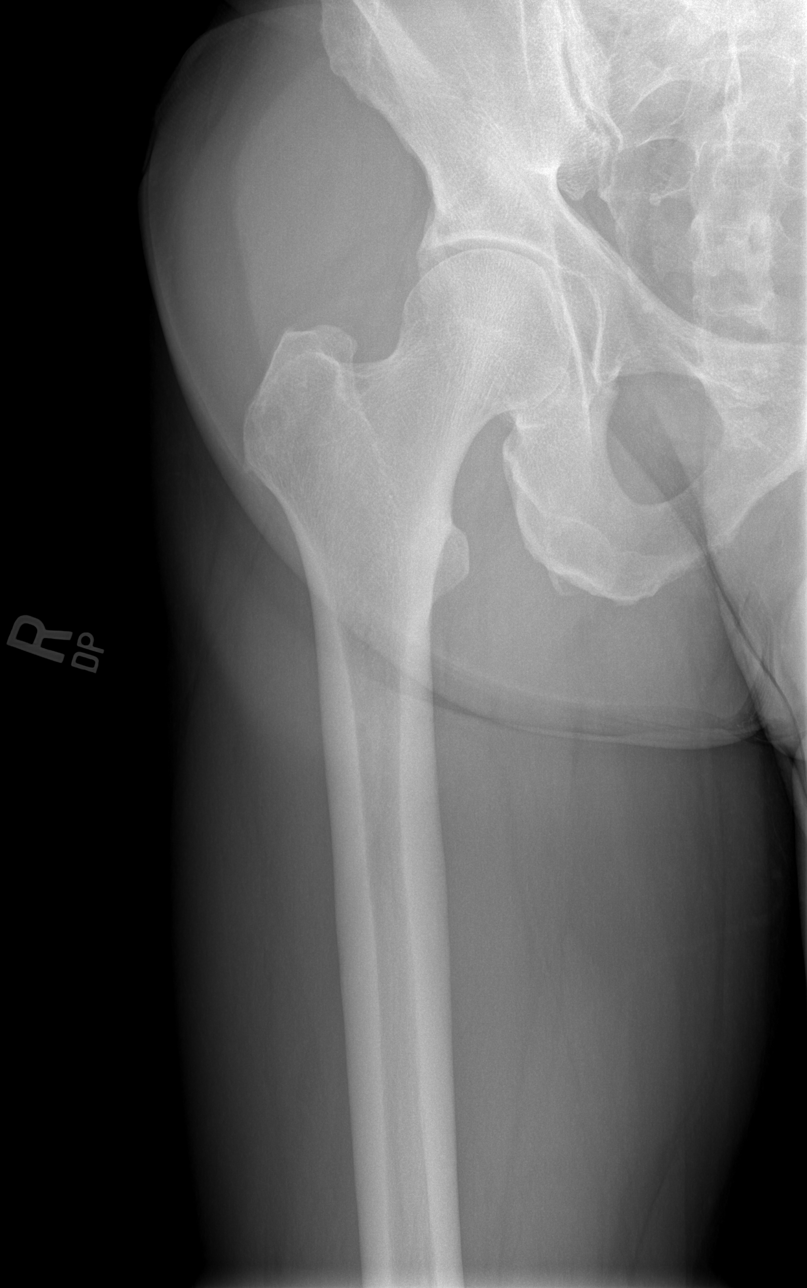

[t femur with knee ap right]
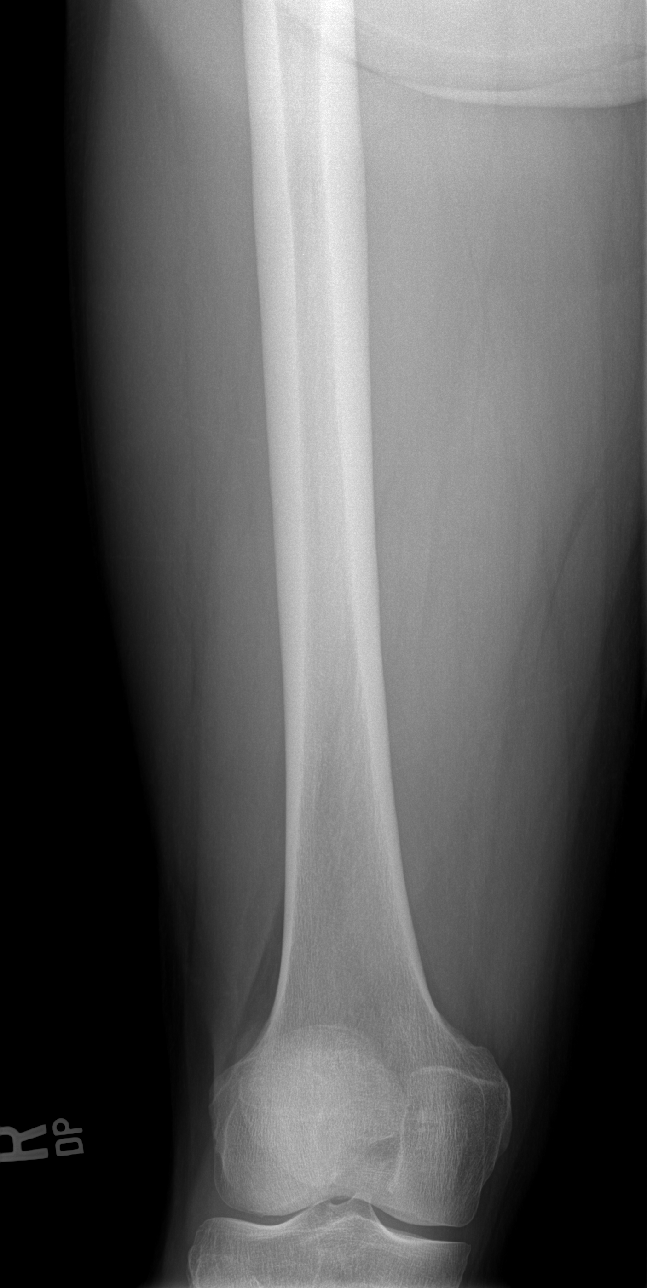

[t femur with hip lat right]
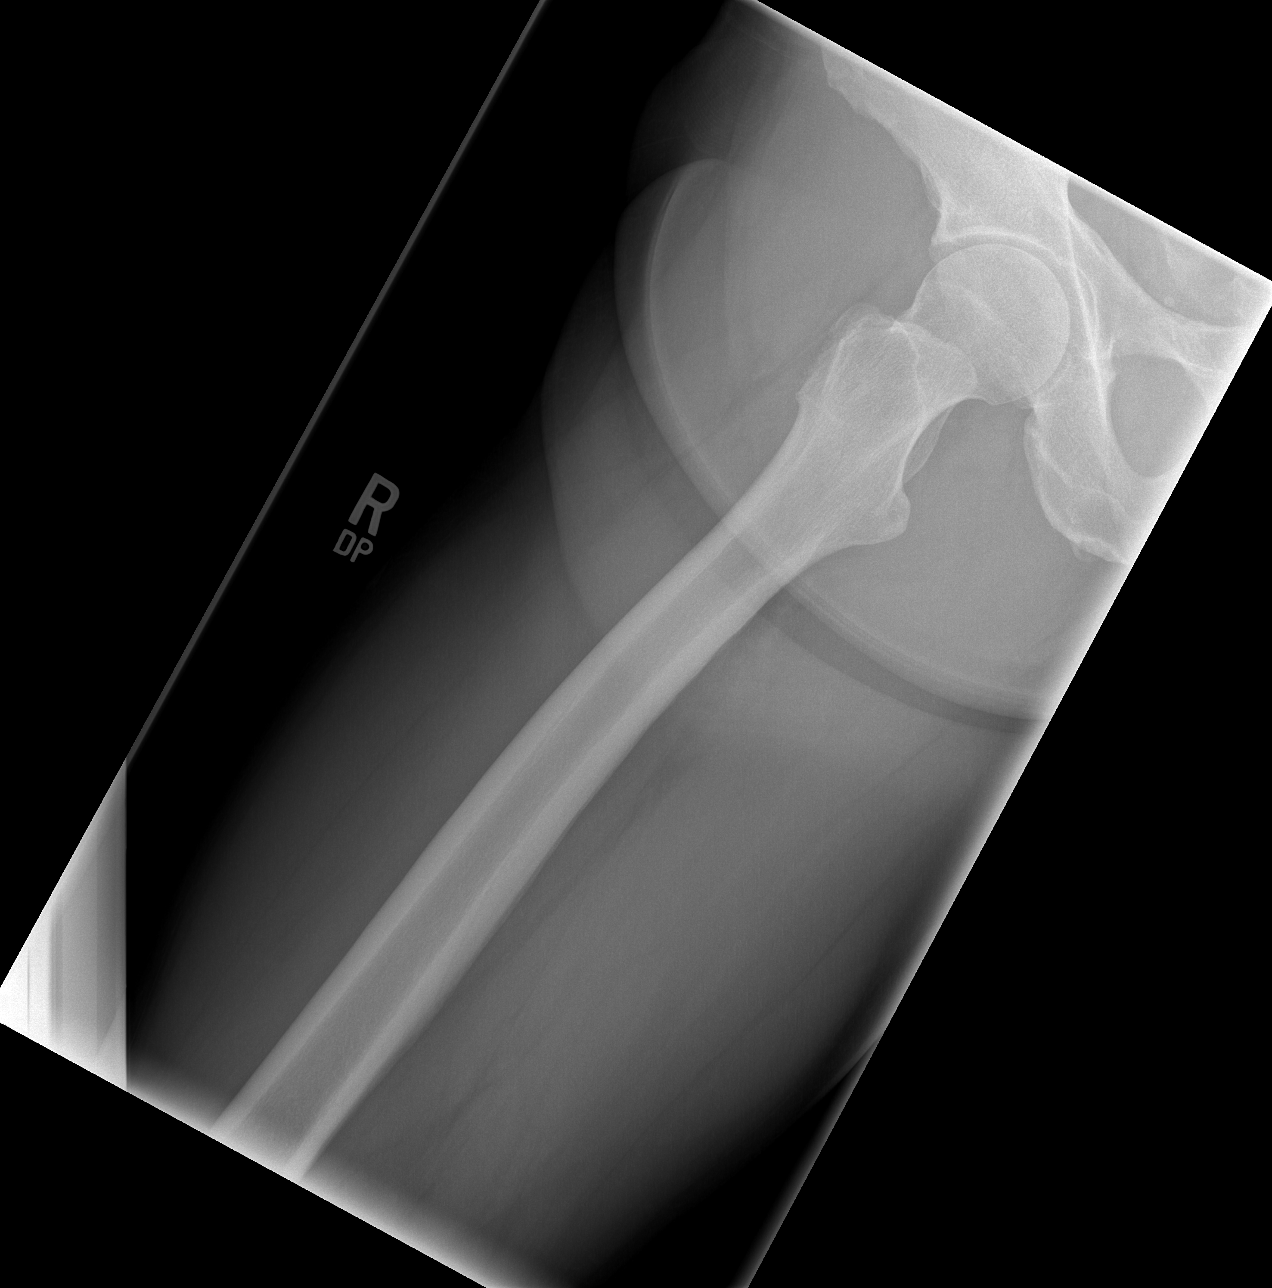

[t femur with knee lat right]
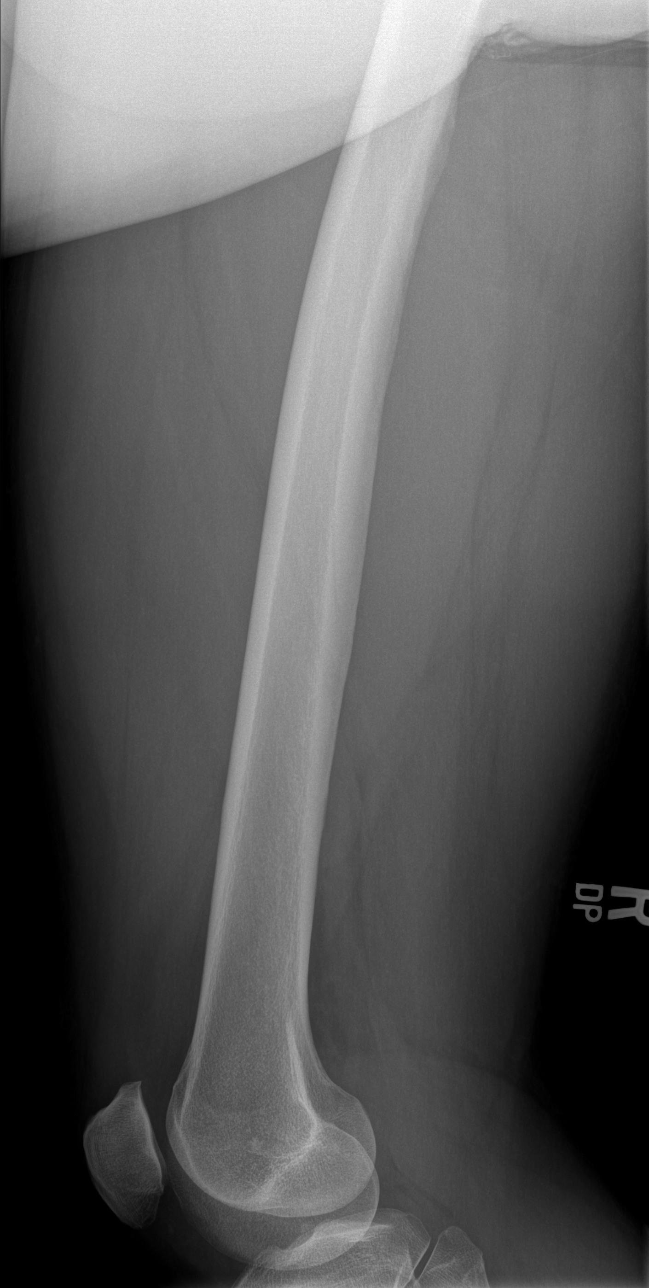

[4 of 4 positions shown; findings below may reference images not displayed]

FINDINGS: There is no evidence of fracture or other focal bone lesions. Soft
tissues are unremarkable.
IMPRESSION: Negative.

## 2022-09-13 ENCOUNTER — Ambulatory Visit: Payer: Managed Care, Other (non HMO)

## 2022-09-13 ENCOUNTER — Ambulatory Visit: Payer: Managed Care, Other (non HMO) | Admitting: Podiatry

## 2022-09-13 ENCOUNTER — Encounter: Payer: Self-pay | Admitting: Podiatry

## 2022-09-13 DIAGNOSIS — Q66229 Congenital metatarsus adductus, unspecified foot: Secondary | ICD-10-CM

## 2022-09-13 DIAGNOSIS — M21621 Bunionette of right foot: Secondary | ICD-10-CM | POA: Diagnosis not present

## 2022-09-13 NOTE — Progress Notes (Signed)
  Subjective:  Patient ID: Jon Half., male    DOB: 1960-10-31,  MRN: [id number]  Chief Complaint  Patient presents with   Foot Pain    est - left foot - issues w/ arch support - he's having trouble with his orthotics and his work shoes    62 y.o. male returns with the above complaint. History confirmed with patient.  Having increased foot pain, they changed the policy at work that he cannot wear athletic type shoes anymore and having to wear dress shoes again.  He still having pain with squeeze of the foot from lateral compression  Objective:  Physical Exam: warm, good capillary refill, no trophic changes or ulcerative lesions, normal DP and PT pulses and normal sensory exam.  Bilateral he has tailor's bunion deformity with some pain on the fifth metatarsal head, some arch pain today   Radiographs: X-ray of both feet: Mild pes cavus foot type with mild metatarsus adductus.  There is a small calcaneal enthesophyte posteriorly Assessment:   1. Metatarsus adductus   2. Tailor's bunion of right foot      Plan:  Patient was evaluated and treated and all questions answered.  Was doing very well with an athletic type shoe until his work policy had changed that he cannot wear athletic shoes anymore and he is not able to find any shoes currently that will fit his orthotics.  We discussed different brands of shoes I recommended he look at [employer] shoes likely should build to fit his orthoses especially a soft soled shoe.  Letter was provided for him to wear this which I think is medically necessary.  We also discussed if he is not able to find any shoe that will fit his current orthotics a dress pair of orthotics may be beneficial to offer increased relief and pressure support we also discussed the appropriate width of his shoes so that his tailor's bunions not have increased pressure and pain here.  No follow-ups on file.

## 2023-03-03 ENCOUNTER — Encounter: Payer: Self-pay | Admitting: Gastroenterology

## 2023-03-10 ENCOUNTER — Encounter: Payer: Self-pay | Admitting: Internal Medicine

## 2023-03-28 ENCOUNTER — Encounter (HOSPITAL_COMMUNITY): Payer: Self-pay | Admitting: Internal Medicine

## 2023-03-28 ENCOUNTER — Other Ambulatory Visit (HOSPITAL_COMMUNITY): Payer: Self-pay | Admitting: Internal Medicine

## 2023-03-28 DIAGNOSIS — E785 Hyperlipidemia, unspecified: Secondary | ICD-10-CM

## 2023-04-11 ENCOUNTER — Ambulatory Visit (HOSPITAL_COMMUNITY)
Admission: RE | Admit: 2023-04-11 | Discharge: 2023-04-11 | Disposition: A | Discharge status: Home / Self Care | Payer: Self-pay | Source: Ambulatory Visit | Attending: Internal Medicine | Admitting: Internal Medicine

## 2023-04-11 DIAGNOSIS — E785 Hyperlipidemia, unspecified: Secondary | ICD-10-CM | POA: Insufficient documentation

## 2023-04-14 ENCOUNTER — Encounter: Payer: Self-pay | Admitting: Podiatry

## 2023-04-21 ENCOUNTER — Encounter: Payer: Managed Care, Other (non HMO) | Admitting: Internal Medicine

## 2023-04-27 ENCOUNTER — Encounter: Payer: Self-pay | Admitting: Podiatry

## 2023-04-27 ENCOUNTER — Ambulatory Visit (INDEPENDENT_AMBULATORY_CARE_PROVIDER_SITE_OTHER): Payer: Managed Care, Other (non HMO)

## 2023-04-27 ENCOUNTER — Ambulatory Visit: Payer: Managed Care, Other (non HMO) | Admitting: Podiatry

## 2023-04-27 DIAGNOSIS — M778 Other enthesopathies, not elsewhere classified: Secondary | ICD-10-CM

## 2023-04-27 DIAGNOSIS — M7752 Other enthesopathy of left foot: Secondary | ICD-10-CM

## 2023-04-27 NOTE — Progress Notes (Signed)
  Subjective:  Patient ID: Jon Richardson., male    DOB: 07-02-1960,  MRN: [id number]  Chief Complaint  Patient presents with   Foot Pain    Patient is here for left foot pain, sharp radiating pain top of foot for whole day    63 y.o. male returns with the above complaint. History confirmed with patient.  Returns for follow-up having severe pain over the last 2 weeks different from his previous pain feels more like is deep inside the middle of the foot foot is incredibly painful to wear compression across or squeeze his foot together.  His previous orthotics and shoe gear changes have greatly improved  Objective:  Physical Exam: warm, good capillary refill, no trophic changes or ulcerative lesions, normal DP and PT pulses and normal sensory exam.  today he has sharp pain to palpation with lateral compression and squeeze and palpation of the second and third intermetatarsal space distal to the TMT joint  Radiographs: X-ray of left foot taken date shows no acute fracture or stress fracture there are mild degenerative changes in the third TMT Assessment:   1. Bursitis of intermetatarsal bursa of left foot      Plan:  Patient was evaluated and treated and all questions answered.  We reviewed his x-rays and discussed the bursitis he is experiencing which is consistent with his symptoms.  There is no clinical or radiographic evidence of a stress fracture developing currently.  I would expect this to be the case since his pain has been going on for about 2 weeks we should see some evidence of stress fracture if 1 was developing.  I do think he likely has intermetatarsal bursitis that is causing most of his symptoms and I recommended corticosteroid injection.  Following sterile prep with alcohol and using ethyl chloride spray as a topical anesthetic 4 mg of dexamethasone and 0.5 cc of 0.5% Marcaine plain was injected into the second and third intermetatarsal bursal space separately and was  dressed with a bandage.  He tolerated this well.  He will return to see me as needed.  I expect this to give him at least several months relief.  If not improving would recommend MRI.  No follow-ups on file.

## 2023-06-08 ENCOUNTER — Encounter: Payer: Self-pay | Admitting: Podiatry

## 2023-06-16 NOTE — Progress Notes (Unsigned)
 Cardiology Office Note:    Date:  06/20/2023   ID:  Jon Half., DOB 24-Feb-1961, MRN [id number]  PCP:  No primary care provider on file.   [facility] Providers Cardiologist:  None     Referring MD: Emilio Aspen, *   Chief Complaint  Patient presents with   Coronary Artery Disease    History of Present Illness:    Jon Inks Chernick Montez Hageman. is a 63 y.o. male with a hx of orthostatic hypotension, pheochromocytoma s/p adrenalectomy, and coronary calcification seen to reestablish care. Last seen in 2021. He had prior Echo and ETT that were unremarkable. He has CKD. Of note orthostasis resolved following adrenal surgery. Most recent coronary calcium score increased from 115 to 257.   He states he gets a lot of activity at work. Starting to get back into gym. Eats a pretty healthy diet. Lost 15 lbs with Ozempic. Notes history of statin induced myalgias. Mother had MI in 7s. He denies any chest pain or dyspnea.   Past Medical History:  Diagnosis Date   Anxiety    BCC (basal cell carcinoma of skin) 03-2013   scalp   BPH (benign prostatic hyperplasia)    Cervical pain (neck)    Diverticulosis    Fatty liver    GERD    Headache    HERPES SIMPLEX, UNCOMPLICATED    History of exercise stress test    ETT 3/18: Ex 10'02", normal BP response, no ST changes; neg adequate ETT   Irritable bowel syndrome (IBS)    WITH DIARRHEA   LUMBAR RADICULOPATHY, LEFT    Melanoma in situ (HCC) 12/2014   On lateral right thigh, Dr. Meredith Mody   Other and unspecified hyperlipidemia    Other chronic sinusitis    Other testicular hypofunction    PANIC ATTACK, ACUTE     Past Surgical History:  Procedure Laterality Date   BACK SURGERY  2008   birth mark removal     as baby   COLONOSCOPY     GANGLION CYST EXCISION     finger   HERNIA REPAIR     Inguinal   ORCHIECTOMY Right ~1995   benign   SPINE SURGERY     TONSILLECTOMY      Current Medications: Current Meds   Medication Sig   Alpha-D-Galactosidase (BEANO) TABS Take by mouth as needed.   aspirin 81 MG tablet Take 81 mg by mouth daily.   cetirizine (ZYRTEC) 10 MG chewable tablet Chew 10 mg by mouth daily.   ezetimibe (ZETIA) 10 MG tablet TAKE ONE TABLET BY MOUTH DAILY   famotidine (PEPCID) 40 MG tablet Take 1 tablet (40 mg total) by mouth daily.   fenofibrate 160 MG tablet TAKE ONE TABLET BY MOUTH DAILY   finasteride (PROSCAR) 5 MG tablet Take 5 mg by mouth daily.   fluticasone (FLONASE) 50 MCG/ACT nasal spray Place 2 sprays into both nostrils daily.   OZEMPIC, 0.25 OR 0.5 MG/DOSE, 2 MG/3ML SOPN 0.5 mg once a week.   pantoprazole (PROTONIX) 40 MG tablet Take 1 tablet (40 mg total) by mouth 2 (two) times daily.   tadalafil (CIALIS) 5 MG tablet Take 5 mg by mouth daily as needed.   testosterone cypionate (DEPOTESTOSTERONE CYPIONATE) 200 MG/ML injection Inject 100 mg into the muscle once a week.   valACYclovir (VALTREX) 500 MG tablet Take 1 tablet (500 mg total) by mouth daily.   Zinc 50 MG CAPS Take by mouth.     Allergies:  Erythromycin   Social History   Socioeconomic History   Marital status: Single    Spouse name: Not on file   Number of children: 0   Years of education: HS   Highest education level: Not on file  Occupational History   Occupation: Control and instrumentation engineer   Tobacco Use   Smoking status: Never   Smokeless tobacco: Never  Vaping Use   Vaping status: Never Used  Substance and Sexual Activity   Alcohol use: Yes    Alcohol/week: 0.0 standard drinks of alcohol    Comment: Approximately once a month   Drug use: No   Sexual activity: Yes  Other Topics Concern   Not on file  Social History Narrative   Lives w/ partner Vickii Chafe.   Right-handed.   2 cups coffee daily.   Social Drivers of Corporate investment banker Strain: Not on file  Food Insecurity: Not on file  Transportation Needs: Not on file  Physical Activity: Not on file  Stress: Not on file  Social  Connections: Unknown (01/25/2022)   Received [facility]   Social Network    Social Network: Not on file     Family History: The patient's family history includes Cancer in his maternal uncle; Colon cancer in his paternal grandmother; Heart attack in his mother and paternal grandfather; Heart disease in his maternal grandmother and mother; Lung cancer in his father; Prostate cancer in his father and paternal uncle. There is no history of Esophageal cancer, Rectal cancer, or Stomach cancer.  ROS:   Please see the history of present illness.     All other systems reviewed and are negative.  EKGs/Labs/Other Studies Reviewed:    The following studies were reviewed today: Coronary Calcium Score   TECHNIQUE: A gated, non-contrast computed tomography scan of the heart was performed using 3mm slice thickness. Axial images were analyzed on a dedicated workstation. Calcium scoring of the coronary arteries was performed using the Agatston method.   FINDINGS: Coronary Calcium Score:   Left main: 0   Left anterior descending artery: 96.1   Left circumflex artery: 2.59   Right coronary artery: 158   Total: 257   Percentile: 78   Pericardium: Normal.   Ascending Aorta: Normal caliber.   Non-cardiac: See separate report from [facility].   IMPRESSION: Coronary calcium score of 257. This was 63 percentile for age-, race-, and sex-matched controls.   EKG Interpretation Date/Time:  Tuesday June 20 2023 08:49:49 EST Ventricular Rate:  69 PR Interval:  118 QRS Duration:  92 QT Interval:  392 QTC Calculation: 420 R Axis:   -60  Text Interpretation: Normal sinus rhythm Pulmonary disease pattern Left anterior fascicular block When compared with ECG of 15-Sep-2019 03:40, No significant change was found Confirmed by [place], Irelyn Perfecto [phone]) on 06/20/2023 8:53:19 AM    Recent Labs: No results found for requested labs within last 365 days.  Recent Lipid  Panel    Component Value Date/Time   CHOL 140 02/14/2020 0920   TRIG 147.0 02/14/2020 0920   HDL 42.00 02/14/2020 0920   CHOLHDL 3 02/14/2020 0920   VLDL 29.4 02/14/2020 0920   LDLCALC 69 02/14/2020 0920   LDLCALC 92 03/21/2018 1541   LDLDIRECT 114.0 10/13/2016 0944   Dated 05/22/23: cholesterol 141, triglycerides 78, HDL 43, LDL 82. A1c 5.7%. CBC and CMET normal.   Risk Assessment/Calculations:                Physical Exam:  VS:  BP 114/70 (BP Location: Right Arm, Cuff Size: Normal)   Pulse 69   Ht 5\' 7"  (1.702 m)   Wt 163 lb 6.4 oz (74.1 kg)   SpO2 96%   BMI 25.59 kg/m     Wt Readings from Last 3 Encounters:  06/20/23 163 lb 6.4 oz (74.1 kg)  08/16/21 173 lb 12.8 oz (78.8 kg)  01/25/21 179 lb (81.2 kg)     GEN:  Well nourished, well developed in no acute distress HEENT: Normal NECK: No JVD; No carotid bruits LYMPHATICS: No lymphadenopathy CARDIAC: RRR, no murmurs, rubs, gallops RESPIRATORY:  Clear to auscultation without rales, wheezing or rhonchi  ABDOMEN: Soft, non-tender, non-distended MUSCULOSKELETAL:  No edema; No deformity  SKIN: Warm and dry NEUROLOGIC:  Alert and oriented x 3 PSYCHIATRIC:  Normal affect   ASSESSMENT:    1. Coronary artery disease involving native coronary artery of native heart without angina pectoris   2. Mixed hyperlipidemia   3. Type 2 diabetes mellitus without complication, without long-term current use of insulin (HCC)    PLAN:    In order of problems listed above:  CAD with elevated calcium score. No symptoms of angina. Recommend focusing on risk factor modification. Continue regular aerobic activity and healthy diet. Continue ASA 81 mg daily. Sugar is well controlled. Could improve lipid therapy. Would like to see LDL < 55. HLD on fenofibrate and Zetia. Statin intolerant. LDL 82 with goal < 55. Will refer to pharm D to consider injectable therapy DM well contolled. A1c 5.7%. HTN well controlled now on no medication since  removal of pheochromocytoma.            Medication Adjustments/Labs and Tests Ordered: Current medicines are reviewed at length with the patient today.  Concerns regarding medicines are outlined above.  Orders Placed This Encounter  Procedures   EKG 12-Lead   No orders of the defined types were placed in this encounter.   There are no Patient Instructions on file for this visit.   Signed, Azari Janssens [place], MD  06/20/2023 9:15 AM    [facility]

## 2023-06-19 ENCOUNTER — Ambulatory Visit: Payer: Managed Care, Other (non HMO) | Admitting: Podiatry

## 2023-06-19 DIAGNOSIS — M7752 Other enthesopathy of left foot: Secondary | ICD-10-CM

## 2023-06-19 NOTE — Progress Notes (Signed)
  Subjective:  Patient ID: Jon Richardson., male    DOB: 12/18/1960,  MRN: [id number]  Chief Complaint  Patient presents with   Bursitis of intermetatarsal bursa of left foot    Pt stated that he is still having some discomfort in his feet     63 y.o. male returns with the above complaint. History confirmed with patient.  He returns for follow-up, improved a little bit from the injection but still quite painful  Objective:  Physical Exam: warm, good capillary refill, no trophic changes or ulcerative lesions, normal DP and PT pulses and normal sensory exam.  today he has sharp pain to palpation with lateral compression and squeeze and palpation of the second and third intermetatarsal space distal to the TMT joint  Radiographs: X-ray of left foot taken date shows no acute fracture or stress fracture there are mild degenerative changes in the third TMT Assessment:   1. Bursitis of intermetatarsal bursa of left foot      Plan:  Patient was evaluated and treated and all questions answered.  Has not had significant progress with an injection.  I recommended ultrasound evaluation for intermetatarsal bursitis versus neuroma.  Will hold off on further injection at this point.  If the ultrasound is unrevealing we will consider MRI.  I will see him back following the study to reevaluate  Return for after ultrasound to review.

## 2023-06-19 NOTE — Patient Instructions (Signed)
Call [facility] to schedule your ultrasound at the below locations.  Please allow at least 1 business day after your visit to process the referral.  It may take longer depending on approval from insurance.  Please let me know if you have issues or problems scheduling the ultrasound   [facility] [name], [place] [address]  [place] [address] [place], [place] [address]

## 2023-06-20 ENCOUNTER — Ambulatory Visit: Payer: Managed Care, Other (non HMO) | Attending: Cardiology | Admitting: Cardiology

## 2023-06-20 ENCOUNTER — Encounter: Payer: Self-pay | Admitting: Cardiology

## 2023-06-20 VITALS — BP 114/70 | HR 69 | Ht 67.0 in | Wt 163.4 lb

## 2023-06-20 DIAGNOSIS — T466X5D Adverse effect of antihyperlipidemic and antiarteriosclerotic drugs, subsequent encounter: Secondary | ICD-10-CM

## 2023-06-20 DIAGNOSIS — E119 Type 2 diabetes mellitus without complications: Secondary | ICD-10-CM

## 2023-06-20 DIAGNOSIS — I2510 Atherosclerotic heart disease of native coronary artery without angina pectoris: Secondary | ICD-10-CM | POA: Diagnosis not present

## 2023-06-20 DIAGNOSIS — E782 Mixed hyperlipidemia: Secondary | ICD-10-CM

## 2023-06-20 DIAGNOSIS — M791 Myalgia, unspecified site: Secondary | ICD-10-CM

## 2023-06-20 NOTE — Patient Instructions (Signed)
 Medication Instructions:  Continue same medications *If you need a refill on your cardiac medications before your next appointment, please call your pharmacy*   Lab Work: None ordered   Testing/Procedures: None ordered   Follow-Up: At [facility], you and your health needs are our priority.  As part of our continuing mission to provide you with exceptional heart care, we have created designated Provider Care Teams.  These Care Teams include your primary Cardiologist (physician) and Advanced Practice Providers (APPs -  Physician Assistants and Nurse Practitioners) who all work together to provide you with the care you need, when you need it.  We recommend signing up for the patient portal called "MyChart".  Sign up information is provided on this After Visit Summary.  MyChart is used to connect with patients for Virtual Visits (Telemedicine).  Patients are able to view lab/test results, encounter notes, upcoming appointments, etc.  Non-urgent messages can be sent to your provider as well.   To learn more about what you can do with MyChart, go to [email or web address].    Your next appointment:  1 year  Call in Nov to schedule Feb appointment     Provider:  Dr.Jordan         Schedule appointment with PharmD in Lipid Clinic

## 2023-07-04 ENCOUNTER — Ambulatory Visit: Payer: Managed Care, Other (non HMO) | Admitting: Podiatry

## 2023-07-13 ENCOUNTER — Ambulatory Visit
Admission: RE | Admit: 2023-07-13 | Discharge: 2023-07-13 | Disposition: A | Discharge status: Home / Self Care | Source: Ambulatory Visit | Attending: Podiatry | Admitting: Podiatry

## 2023-07-13 DIAGNOSIS — M7752 Other enthesopathy of left foot: Secondary | ICD-10-CM

## 2023-07-24 ENCOUNTER — Encounter: Payer: Self-pay | Admitting: Podiatry

## 2023-08-16 ENCOUNTER — Encounter: Payer: Self-pay | Admitting: Pharmacist Clinician (PhC)/ Clinical Pharmacy Specialist

## 2023-08-16 ENCOUNTER — Telehealth: Payer: Self-pay | Admitting: Pharmacist Clinician (PhC)/ Clinical Pharmacy Specialist

## 2023-08-16 ENCOUNTER — Other Ambulatory Visit (HOSPITAL_COMMUNITY): Payer: Self-pay

## 2023-08-16 ENCOUNTER — Telehealth: Payer: Self-pay | Admitting: Pharmacy Technician

## 2023-08-16 ENCOUNTER — Ambulatory Visit
Payer: Managed Care, Other (non HMO) | Attending: Cardiovascular Disease | Admitting: Pharmacist Clinician (PhC)/ Clinical Pharmacy Specialist

## 2023-08-16 DIAGNOSIS — E7849 Other hyperlipidemia: Secondary | ICD-10-CM

## 2023-08-16 NOTE — Telephone Encounter (Signed)
 Please do PA for Repatha

## 2023-08-16 NOTE — Assessment & Plan Note (Signed)
 Assessment: Patient with ASCVD not at LDL goal of < 55 Most recent LDL 82 on 04/2023 (on ezetimibe ) Has been compliant with ezetimibe  10 mg daily Not able to tolerate statins secondary to myalgias Reviewed options for lowering LDL cholesterol, including ezetimibe , PCSK-9 inhibitors, bempedoic acid and inclisiran.  Discussed mechanisms of action, dosing, side effects, potential decreases in LDL cholesterol and costs.  Also reviewed potential options for patient assistance.  Plan: Patient agreeable to starting Repatha 140 mg q14d Repeat labs after:  3 months Lipid Liver function Patient was given information on copay card

## 2023-08-16 NOTE — Telephone Encounter (Signed)
 Ran test claim for REPATHA. For a 28 day supply and the co-pay is 35.00 . PA is not needed at this time. Nothing saying this is a transition fill.   This test claim was processed through [facility]- copay amounts may vary at other pharmacies due to pharmacy/plan contracts, or as the patient moves through the different stages of their insurance plan.

## 2023-08-16 NOTE — Progress Notes (Signed)
 Office Visit    Patient Name: Jon Richardson. Date of Encounter: 08/16/2023  Primary Care Provider:  No primary care provider on file. Primary Cardiologist:  [place], Peter MD  Chief Complaint    Hyperlipidemia   Significant Past Medical History   CAD CAC = 257 (78th percentile)  DM2 1/25 A1c 5.7 (high was 6.7)  HTN Improved w/removal of pheochromocytoma; no current meds  CKD 1/25 GFR 62     Allergies  Allergen Reactions   Erythromycin Diarrhea    abd cramps    History of Present Illness    Jon Richardson. is a 63 y.o. male patient of Dr [place], in the office today to discuss options for cholesterol management.  Pheo removed in 2021- has felt low energy since then (no high BP, was hot/sweaty, dx by other symptoms  Insurance Carrier:  Chief of Staff:  qualifies for co-pay card    LDL Cholesterol goal:  LDL < 55  Current Medications:   ezetimibe  10 mg every day, fenofibrate  160 mg every day   Previously tried:  rosuvastatin , atorvastatin  - myalgias  Family Hx: mother died from MI, CHF died at 9; father living in 31's (had lung and prostate cancer), still working at [employer];  sister w/o heart issues, no kids  Social Hx: Tobacco: no Alcohol: no    Diet:  mix of home and out.  Restaurants usually lunch, sandwich, salad, occasional fast food;   variety of proteins, lot of beans, daily vegetables, fresh and frozen; doesn't snack much, does have sweet tooth, tries to avoid sugars  Exercise: PT for neck regularly    Accessory Clinical Findings   04/2023 in [facility]:  TC 141, TG 78, HDL 43, LDL 82 (ezetimibe )  Lab Results  Component Value Date   CHOL 140 02/14/2020   HDL 42.00 02/14/2020   LDLCALC 69 02/14/2020   LDLDIRECT 114.0 10/13/2016   TRIG 147.0 02/14/2020   CHOLHDL 3 02/14/2020    No results found for: "LIPOA"  Lab Results  Component Value Date   ALT 15 02/14/2020   AST 19 02/14/2020   ALKPHOS 29 (L) 02/14/2020    BILITOT 0.5 02/14/2020   Lab Results  Component Value Date   CREATININE 1.31 02/14/2020   BUN 18 02/14/2020   NA 139 02/14/2020   K 4.6 02/14/2020   CL 102 02/14/2020   CO2 32 02/14/2020   Lab Results  Component Value Date   HGBA1C 6.5 02/14/2020    Home Medications    Current Outpatient Medications  Medication Sig Dispense Refill   Alpha-D-Galactosidase (BEANO) TABS Take by mouth as needed.     aspirin  81 MG tablet Take 81 mg by mouth daily.     cetirizine  (ZYRTEC ) 10 MG chewable tablet Chew 10 mg by mouth daily.     ezetimibe  (ZETIA ) 10 MG tablet TAKE ONE TABLET BY MOUTH DAILY 90 tablet 3   famotidine  (PEPCID ) 40 MG tablet Take 1 tablet (40 mg total) by mouth daily. 90 tablet 0   fenofibrate  160 MG tablet TAKE ONE TABLET BY MOUTH DAILY 30 tablet 3   finasteride (PROSCAR) 5 MG tablet Take 5 mg by mouth daily.     fluticasone  (FLONASE ) 50 MCG/ACT nasal spray Place 2 sprays into both nostrils daily. 16 g 6   OZEMPIC, 0.25 OR 0.5 MG/DOSE, 2 MG/3ML SOPN 0.5 mg once a week.     pantoprazole  (PROTONIX ) 40 MG tablet Take 1 tablet (40 mg total) by mouth  2 (two) times daily. 180 tablet 1   tadalafil (CIALIS) 5 MG tablet Take 5 mg by mouth daily as needed.     testosterone  cypionate (DEPOTESTOSTERONE CYPIONATE) 200 MG/ML injection Inject 100 mg into the muscle once a week.     valACYclovir  (VALTREX ) 500 MG tablet Take 1 tablet (500 mg total) by mouth daily. 90 tablet 0   Zinc 50 MG CAPS Take by mouth.     No current facility-administered medications for this visit.     Assessment & Plan    Hyperlipidemia Assessment: Patient with ASCVD not at LDL goal of < 55 Most recent LDL 82 on 04/2023 (on ezetimibe ) Has been compliant with ezetimibe  10 mg daily Not able to tolerate statins secondary to myalgias Reviewed options for lowering LDL cholesterol, including ezetimibe , PCSK-9 inhibitors, bempedoic acid and inclisiran.  Discussed mechanisms of action, dosing, side effects, potential  decreases in LDL cholesterol and costs.  Also reviewed potential options for patient assistance.  Plan: Patient agreeable to starting Repatha 140 mg q14d Repeat labs after:  3 months Lipid Liver function Patient was given information on copay card   Donivan Furry, PharmD CPP [facility] [address]  [place], [place] [address] [phone]  08/16/2023, 9:03 AM

## 2023-08-16 NOTE — Patient Instructions (Signed)
 Your Results:             Your most recent labs Goal  Total Cholesterol 141 < 200  Triglycerides 78 < 150  HDL (happy/good cholesterol) 43 > 40  LDL (lousy/bad cholesterol 82 < 55   Thank you for choosing [facility]   Medication changes:  We will start the process to get Repatha covered by your insurance.  Once the prior authorization is complete, I will send a MyChart message to let you know and confirm pharmacy information.   You will take one injection every 14 days.    Lab orders:  We want to repeat labs after 2-3 months.  We will send you a lab order to remind you once we get closer to that time.    Patient Assistance:    Repatha Co-pay Card Instructions 1.      Go to [email or web address]  2.      Click the red "Repahta Co-Pay Card" button near the top right 3.      Scroll down and click "Yes, I have a Repatha prescription 4.      Continue to scroll down and selected "[employer]"  5.      Continue to scroll down and for the question "Are you eligible for Medicare but receiving prescription drug coverage from a former employer, union, or welfare plan?" select "No" 6.      Continue to scroll down and click the first box which is next to "Repatha Co-Pay Card, and beneath that, select "I want to enroll in the Repatha Co-Pay Card Program" 7.      Continue to scroll down until you see the "Next" button, then click it 8.      Fill out your personal information then click the "Next" button

## 2023-08-21 MED ORDER — REPATHA SURECLICK 140 MG/ML ~~LOC~~ SOAJ: 140.0000 mg | SUBCUTANEOUS | 3 refills | Status: AC

## 2023-10-26 ENCOUNTER — Encounter: Payer: Self-pay | Admitting: Pharmacist Clinician (PhC)/ Clinical Pharmacy Specialist

## 2023-11-01 ENCOUNTER — Telehealth: Payer: Self-pay | Admitting: Cardiology

## 2023-11-01 DIAGNOSIS — E782 Mixed hyperlipidemia: Secondary | ICD-10-CM

## 2023-11-01 NOTE — Telephone Encounter (Signed)
 Pt is requesting a callback from pharmacist Kristin regarding his MyChart message sent on 7/3. I am following up with you. I have one dose left and we need to check labs. I am having some side effects that we should discuss along with lab results  Please give me a call to set up an appointment. My chart will not allow me to schedule with cardo for some reason. Thx Jon Richardson  Please advise

## 2023-11-02 NOTE — Telephone Encounter (Signed)
 Lipid labs ordered

## 2023-11-02 NOTE — Telephone Encounter (Signed)
Patient is calling back for update. Please advise  

## 2023-11-03 NOTE — Telephone Encounter (Signed)
 Spoke with patient on phone, labs ordered.

## 2024-06-20 ENCOUNTER — Ambulatory Visit: Admitting: Cardiology
